# Patient Record
Sex: Male | Born: 1960
Health system: Southern US, Community
[De-identification: ages and names within clinical notes are randomized; demographics above are authoritative.]

## PROBLEM LIST (undated history)

## (undated) DIAGNOSIS — C7951 Secondary malignant neoplasm of bone: Secondary | ICD-10-CM

## (undated) DIAGNOSIS — F329 Major depressive disorder, single episode, unspecified: Secondary | ICD-10-CM

## (undated) DIAGNOSIS — B029 Zoster without complications: Secondary | ICD-10-CM

## (undated) DIAGNOSIS — C9001 Multiple myeloma in remission: Secondary | ICD-10-CM

## (undated) DIAGNOSIS — Z9119 Patient's noncompliance with other medical treatment and regimen: Secondary | ICD-10-CM

## (undated) DIAGNOSIS — Z79899 Other long term (current) drug therapy: Secondary | ICD-10-CM

## (undated) DIAGNOSIS — R040 Epistaxis: Secondary | ICD-10-CM

## (undated) DIAGNOSIS — Z923 Personal history of irradiation: Secondary | ICD-10-CM

## (undated) DIAGNOSIS — H538 Other visual disturbances: Secondary | ICD-10-CM

## (undated) DIAGNOSIS — E785 Hyperlipidemia, unspecified: Secondary | ICD-10-CM

## (undated) DIAGNOSIS — R110 Nausea: Secondary | ICD-10-CM

## (undated) DIAGNOSIS — K219 Gastro-esophageal reflux disease without esophagitis: Secondary | ICD-10-CM

## (undated) DIAGNOSIS — R5383 Other fatigue: Secondary | ICD-10-CM

## (undated) DIAGNOSIS — D696 Thrombocytopenia, unspecified: Secondary | ICD-10-CM

## (undated) DIAGNOSIS — K869 Disease of pancreas, unspecified: Secondary | ICD-10-CM

## (undated) DIAGNOSIS — I10 Essential (primary) hypertension: Secondary | ICD-10-CM

## (undated) DIAGNOSIS — C9002 Multiple myeloma in relapse: Secondary | ICD-10-CM

## (undated) DIAGNOSIS — Z7969 Long term (current) use of other immunomodulators and immunosuppressants: Secondary | ICD-10-CM

## (undated) DIAGNOSIS — R51 Headache: Secondary | ICD-10-CM

## (undated) DIAGNOSIS — M8700 Idiopathic aseptic necrosis of unspecified bone: Secondary | ICD-10-CM

## (undated) DIAGNOSIS — C9030 Solitary plasmacytoma not having achieved remission: Secondary | ICD-10-CM

## (undated) DIAGNOSIS — F419 Anxiety disorder, unspecified: Secondary | ICD-10-CM

## (undated) DIAGNOSIS — R5081 Fever presenting with conditions classified elsewhere: Secondary | ICD-10-CM

## (undated) DIAGNOSIS — K769 Liver disease, unspecified: Secondary | ICD-10-CM

## (undated) DIAGNOSIS — C9022 Extramedullary plasmacytoma in relapse: Secondary | ICD-10-CM

## (undated) DIAGNOSIS — M25569 Pain in unspecified knee: Secondary | ICD-10-CM

## (undated) DIAGNOSIS — D709 Neutropenia, unspecified: Secondary | ICD-10-CM

## (undated) HISTORY — DX: Multiple myeloma in remission: C90.01

## (undated) HISTORY — DX: Patient's noncompliance with other medical treatment and regimen: Z91.19

## (undated) HISTORY — DX: Other fatigue: R53.83

## (undated) HISTORY — DX: Headache: R51

## (undated) HISTORY — DX: Neutropenia, unspecified: D70.9

## (undated) HISTORY — DX: Idiopathic aseptic necrosis of unspecified bone: M87.00

## (undated) HISTORY — DX: Solitary plasmacytoma not having achieved remission: C90.30

## (undated) HISTORY — DX: Major depressive disorder, single episode, unspecified: F32.9

## (undated) HISTORY — DX: Other visual disturbances: H53.8

## (undated) HISTORY — DX: Hyperlipidemia, unspecified: E78.5

## (undated) HISTORY — DX: Anxiety disorder, unspecified: F41.9

## (undated) HISTORY — DX: Nausea: R11.0

## (undated) HISTORY — DX: Fever presenting with conditions classified elsewhere: R50.81

## (undated) HISTORY — DX: Epistaxis: R04.0

## (undated) HISTORY — DX: Zoster without complications: B02.9

## (undated) HISTORY — DX: Multiple myeloma in relapse: C90.02

## (undated) HISTORY — DX: Extramedullary plasmacytoma in relapse: C90.22

## (undated) HISTORY — DX: Pain in unspecified knee: M25.569

## (undated) HISTORY — PX: OTHER SURGICAL HISTORY: SHX169

---

## 2003-07-19 ENCOUNTER — Encounter: Payer: Self-pay | Admitting: Internal Medicine

## 2003-07-19 ENCOUNTER — Ambulatory Visit (HOSPITAL_COMMUNITY): Admission: RE | Admit: 2003-07-19 | Discharge: 2003-07-19 | Payer: Self-pay | Admitting: Internal Medicine

## 2003-08-23 ENCOUNTER — Ambulatory Visit (HOSPITAL_COMMUNITY): Admission: RE | Admit: 2003-08-23 | Discharge: 2003-08-23 | Payer: Self-pay | Admitting: Internal Medicine

## 2003-08-27 ENCOUNTER — Emergency Department (HOSPITAL_COMMUNITY): Admission: EM | Admit: 2003-08-27 | Discharge: 2003-08-27 | Payer: Self-pay | Admitting: Emergency Medicine

## 2003-08-27 ENCOUNTER — Ambulatory Visit (HOSPITAL_COMMUNITY): Admission: RE | Admit: 2003-08-27 | Discharge: 2003-08-27 | Payer: Self-pay | Admitting: Internal Medicine

## 2006-10-13 ENCOUNTER — Ambulatory Visit (HOSPITAL_COMMUNITY): Admission: RE | Admit: 2006-10-13 | Discharge: 2006-10-13 | Payer: Self-pay | Admitting: Family Medicine

## 2009-09-04 ENCOUNTER — Emergency Department (HOSPITAL_COMMUNITY): Admission: EM | Admit: 2009-09-04 | Discharge: 2009-09-04 | Payer: Self-pay | Admitting: Emergency Medicine

## 2011-01-14 LAB — URINALYSIS, ROUTINE W REFLEX MICROSCOPIC
Glucose, UA: NEGATIVE mg/dL
Leukocytes, UA: NEGATIVE
Protein, ur: >300 mg/dL — AB
Specific Gravity, Urine: >1.03 — ABNORMAL HIGH (ref 1.005–1.030)
pH: 6.5 (ref 5.0–8.0)

## 2011-01-14 LAB — URINE MICROSCOPIC-ADD ON

## 2011-02-27 NOTE — Op Note (Signed)
NAME:  Austin Griffith, Austin Griffith                        ACCOUNT NO.:  [id number]   MEDICAL RECORD NO.:  [id number]                   PATIENT TYPE:  AMB   LOCATION:  DAY                                  FACILITY:  [facility]   PHYSICIAN:  R. Roetta Sessions, M.D.              DATE OF BIRTH:  04/05/1961   DATE OF PROCEDURE:  08/23/2003  DATE OF DISCHARGE:                                 OPERATIVE REPORT   PROCEDURE:  Esophagogastroduodenoscopy with biopsy.   INDICATIONS FOR PROCEDURE:  The patient is a 50 year old with recent upper  abdominal discomfort who had an episode of nausea and vomiting over the past  weekend and brought up a small amount of blood. This was one episode. He has  chronic reflux symptoms and has been on Pepcid and was started on Prevacid  30 mg orally the first of the week. He has not had any odynophagia, no  dysphagia. Labs were reportedly  normal through Dr. Lamar Blinks office but I  do not have copies.   He continues to have upper abdominal pain. He had one black stool but he  tells me he took some Pepto-Bismol just before this observation. EGD is now  being done. This approach has been discussed the patient previously. The  potential risks, benefits, and alternatives have been reviewed, questions  answered. He is agreeable. Please see my handwritten H&P for more  information.   MONITORING:  O2 saturation, blood pressure, pulse, and respirations were  monitored throughout the entire procedure.   CONSCIOUS SEDATION:  Versed 3 mg IV, Demerol 75 mg IV in divided doses.  Cetacaine spray for topical oropharyngeal anesthesia.   INSTRUMENTS:  Olympus video chip adult gastroscope.   FINDINGS:  Examination of the tubular esophagus revealed patulous EG  junction. There were two 3 cm tongues of salmon colored epithelium coming  up above what appeared to be the squamocolumnar junction. There were no  erosions, ulcers, no strictures or evidence of neoplasia.  The EG junction  was  easily traversed.   STOMACH:  The gastric cavity was empty and insufflated well with air. A  thorough examination of the gastric mucosa including a retroflexed view of  the proximal stomach and esophagogastric junction demonstrated only a small  to moderate size hiatal hernia. The gastric mucosa otherwise appeared  normal. The pylorus was patent and easily traversed.   DUODENUM:  The bulb and second portion appeared normal.   THERAPEUTIC/DIAGNOSTIC MANEUVERS:  The two tongues of salmon colored  epithelium in the distal esophagus were biopsied for histologic studies. The  patient tolerated the procedure well and was reacted in endoscopy.   IMPRESSION:  Patulous esophagogastric junction, two tongues of salmon  colored epithelium coming up into the distal esophagus as described above  concerning for Barrett's esophagus biopsied otherwise the esophageal mucosa  appeared normal.   STOMACH:  A moderate to small size hiatal hernia otherwise gastric mucosa  appeared  normal. Patent pylorus, normal D1 and D2.   RECOMMENDATIONS:  1. Continue Prevacid 30 mg orally daily.  2. Check CBC and see where we are at today. Will also obtain amylase, lipase     and obtain a right upper quadrant ultrasound to rule out occult     gallbladder disease. Further recommendations to follow.      ___________________________________________                                            Jonathon Bellows, M.D.   RMR/MEDQ  D:  08/23/2003  T:  08/23/2003  Job:  [id number]   cc:   Corrie Mckusick, M.D.  [address] [place]. [place]  [place]   [address]  Fax: [phone]

## 2012-01-19 ENCOUNTER — Encounter (HOSPITAL_COMMUNITY): Payer: Self-pay

## 2012-01-19 ENCOUNTER — Emergency Department (HOSPITAL_COMMUNITY)
Admission: EM | Admit: 2012-01-19 | Discharge: 2012-01-19 | Disposition: A | Discharge status: Home / Self Care | Payer: No Typology Code available for payment source | Attending: Emergency Medicine | Admitting: Emergency Medicine

## 2012-01-19 ENCOUNTER — Emergency Department (HOSPITAL_COMMUNITY): Payer: No Typology Code available for payment source

## 2012-01-19 DIAGNOSIS — I10 Essential (primary) hypertension: Secondary | ICD-10-CM | POA: Insufficient documentation

## 2012-01-19 DIAGNOSIS — R51 Headache: Secondary | ICD-10-CM | POA: Insufficient documentation

## 2012-01-19 DIAGNOSIS — J329 Chronic sinusitis, unspecified: Secondary | ICD-10-CM | POA: Insufficient documentation

## 2012-01-19 HISTORY — DX: Essential (primary) hypertension: I10

## 2012-01-19 MED ORDER — ACETAMINOPHEN 500 MG PO TABS
1000.0000 mg | ORAL_TABLET | Freq: Once | ORAL | Status: AC
  Administered 2012-01-19: 1000 mg via ORAL
  Filled 2012-01-19: qty 2

## 2012-01-19 MED ORDER — IBUPROFEN 800 MG PO TABS
800.0000 mg | ORAL_TABLET | Freq: Once | ORAL | Status: AC
  Administered 2012-01-19: 800 mg via ORAL
  Filled 2012-01-19: qty 1

## 2012-01-19 MED ORDER — LEVOFLOXACIN 500 MG PO TABS: 500.0000 mg | ORAL_TABLET | Freq: Every day | ORAL | Status: AC

## 2012-01-19 MED ORDER — ONDANSETRON HCL 8 MG PO TABS: 8.0000 mg | ORAL_TABLET | ORAL | Status: AC | PRN

## 2012-01-19 MED ORDER — OXYCODONE-ACETAMINOPHEN 5-325 MG PO TABS: 2.0000 | ORAL_TABLET | ORAL | Status: AC | PRN

## 2012-01-19 NOTE — ED Provider Notes (Signed)
History  This chart was scribed for Austin Hutching, MD by Bennett Scrape. This patient was seen in room [facility] and the patient's care was started at 5:49PM.  CSN: [id number]  Arrival date & time 01/19/12  1618   First MD Initiated Contact with Patient 01/19/12 1723      Chief Complaint  Patient presents with  . Headache  . Epistaxis    The history is provided by the patient. No language interpreter was used.    [name] is a 51 y.o. male who presents to the Emergency Department complaining of 3 days of gradual onset, gradually worsening, constant HA with associated dizziness with standing and visual disturbance described as blurred vision. The HA is bifrontally and bitemporally located. It is non-radiating. The HA is worse with standing, movement and driving. He has not tried any medications at home to improve symptoms. Pt denies having a h/o HAs or having any prior episodes of similar symptoms. Pt also c/o 2 episodes of epistaxis that occurred last week. Pt states that he has seen his PCP for the symptoms and was advised to come here for a CT scan of his head. He denies fever, sore throat, congestion, chest pain, abdominal pain, nausea, diarrhea, SOB, weakness and confusion as associated symptoms. Pt reports that he has a h/o anxiety an was put on ativan. He denies having any recent attacks but does reports an increase in stress at work. 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 Pt also has a h/o HTN and reports that he has been taking his medication as prescribed. He denies smoking and alcohol use.  Pt's PCP is Dr. Genia Hotter.    Past Medical History  Diagnosis Date  . Hypertension     Past Surgical History  Procedure Date  . Joint replacement     r hip replacement  . Left hip surgery     No family history on file.  History  Substance Use Topics  . Smoking status: Never Smoker   . Smokeless  tobacco: Not on file  . Alcohol Use: No      Review of Systems  A complete 10 system review of systems was obtained and all systems are negative except as noted in the HPI and PMH.   Allergies  Review of patient's allergies indicates no known allergies.  Home Medications  No current outpatient prescriptions on file.  Triage Vitals: BP 153/106  Pulse 79  Temp(Src) 97.9 F (36.6 C) (Oral)  Resp 18  Ht 5\' 7"  (1.702 m)  Wt 275 lb (124.739 kg)  BMI 43.07 kg/m2  SpO2 100%  Physical Exam  Nursing note and vitals reviewed. Constitutional: He is oriented to person, place, and time. He appears well-developed and well-nourished.  HENT:  Head: Normocephalic and atraumatic.       No sinus tenderness  Eyes: Conjunctivae and EOM are normal.  Neck: Normal range of motion. Neck supple.  Cardiovascular: Normal rate, regular rhythm and normal heart sounds.   Pulmonary/Chest: Effort normal and breath sounds normal.  Abdominal: Soft. There is no tenderness.  Musculoskeletal: Normal range of motion. He exhibits no edema.  Neurological: He is alert and oriented to person, place, and time.  Skin: Skin is warm and dry.  Psychiatric: He has a normal mood and affect. His behavior is normal.    ED Course  Procedures (including critical care time)  DIAGNOSTIC STUDIES: Oxygen Saturation is 100%  on room air, normal by my interpretation.    COORDINATION OF CARE: 5:54PM-Discussed tylenol, motrin and CT scan of head with pt and pt agreed to plan.   Labs Reviewed - No data to display  Ct Head Wo Contrast  01/19/2012  *RADIOLOGY REPORT*  Clinical Data: Headaches.  High blood pressure.  Dizziness.  CT HEAD WITHOUT CONTRAST  Technique:  Contiguous axial images were obtained from the base of the skull through the vertex without contrast.  Comparison: None available.  Findings:  Opacification mid to posterior right ethmoid sinus air cells with questionable extension through the right lamina papyracea.   There is also poor definition of the posterior wall of the right ethmoid sinus air cells which may represent destruction or deossification. This raises possibility of a mass rather than simple sinus opacification. Atypical infection secondary less likely consideration.  The right sphenoid sinus is completely opacified with mild dehiscence of the surrounding walls laterally anteriorly and medially.  Polypoid opacification anterior aspect of the left sphenoid sinus with air fluid level in the dependent aspect.  ENT consultation recommended for further delineation.  No intracranial hemorrhage.  No CT evidence of large acute infarct.  Mild atrophy without hydrocephalus.  Partially empty sella.  IMPRESSION: Opacification right ethmoid sinus air cells and sphenoid sinus air cells raising possibility of a mass.  ENT consultation recommended for further delineation.  Please see above.  Original Report Authenticated By: Fuller Canada, M.D.     No diagnosis found.    MDM  CT scan discussed with Dr. Annalee Genta in [place]. He recommended treatment for sinusitis with a quinolone.  Will followup in office. Patient understands the possibility of a mass. He will call tomorrow for a appointment with otolaryngologist   I personally performed the services described in this documentation, which was scribed in my presence. The recorded information has been reviewed and considered.       Austin Hutching, MD 01/19/12 2026

## 2012-01-19 NOTE — ED Notes (Signed)
Pt reports has had headache since Saturday, had nose bleed last week and small nose bleed this morning.  Went to Dr. Lamar Blinks office and was told to come here for ct scan.  Pt on medication for htn and says has been taking it as directed.

## 2012-01-19 NOTE — ED Notes (Signed)
Pt denies improvement in headache at this time.  Denies additional needs.  No bleeding at present. No distress noted.

## 2012-01-19 NOTE — Discharge Instructions (Signed)
Prescriptions for antibiotic, pain medicine, nausea medicine.  I discussed your situation with a ear, nose,and throat doctor in [place].  He wants to see you in followup. Call tomorrow for an appointment later in the week.

## 2012-01-21 ENCOUNTER — Other Ambulatory Visit: Payer: Self-pay | Admitting: Otolaryngology

## 2012-01-21 DIAGNOSIS — J3489 Other specified disorders of nose and nasal sinuses: Secondary | ICD-10-CM

## 2012-01-22 ENCOUNTER — Ambulatory Visit
Admission: RE | Admit: 2012-01-22 | Discharge: 2012-01-22 | Disposition: A | Discharge status: Home / Self Care | Payer: No Typology Code available for payment source | Source: Ambulatory Visit | Attending: Otolaryngology | Admitting: Otolaryngology

## 2012-01-22 DIAGNOSIS — J3489 Other specified disorders of nose and nasal sinuses: Secondary | ICD-10-CM

## 2012-01-26 ENCOUNTER — Other Ambulatory Visit: Payer: Self-pay | Admitting: Otolaryngology

## 2012-01-26 HISTORY — PX: MASS BIOPSY: SHX5445

## 2012-02-04 ENCOUNTER — Telehealth: Payer: Self-pay | Admitting: Oncology

## 2012-02-04 NOTE — Telephone Encounter (Signed)
called pts s/w wife and scheduled appt for 04/29.  faxed over a letter to Dr.Byers with appt d/t

## 2012-02-05 ENCOUNTER — Telehealth: Payer: Self-pay | Admitting: Oncology

## 2012-02-05 ENCOUNTER — Encounter: Payer: Self-pay | Admitting: Oncology

## 2012-02-05 DIAGNOSIS — I10 Essential (primary) hypertension: Secondary | ICD-10-CM | POA: Insufficient documentation

## 2012-02-05 NOTE — Telephone Encounter (Signed)
Referred by Dr. Suzanna Obey Dx- Plasma Cell/Ethmoid

## 2012-02-08 ENCOUNTER — Ambulatory Visit (HOSPITAL_BASED_OUTPATIENT_CLINIC_OR_DEPARTMENT_OTHER): Payer: No Typology Code available for payment source | Admitting: Oncology

## 2012-02-08 ENCOUNTER — Ambulatory Visit: Payer: No Typology Code available for payment source

## 2012-02-08 ENCOUNTER — Encounter: Payer: Self-pay | Admitting: Oncology

## 2012-02-08 ENCOUNTER — Other Ambulatory Visit: Payer: No Typology Code available for payment source | Admitting: Lab

## 2012-02-08 ENCOUNTER — Telehealth: Payer: Self-pay | Admitting: Oncology

## 2012-02-08 VITALS — BP 137/98 | HR 68 | Temp 97.6°F | Ht 74.0 in | Wt 288.7 lb

## 2012-02-08 DIAGNOSIS — R519 Headache, unspecified: Secondary | ICD-10-CM | POA: Insufficient documentation

## 2012-02-08 DIAGNOSIS — I10 Essential (primary) hypertension: Secondary | ICD-10-CM

## 2012-02-08 DIAGNOSIS — D729 Disorder of white blood cells, unspecified: Secondary | ICD-10-CM

## 2012-02-08 DIAGNOSIS — R040 Epistaxis: Secondary | ICD-10-CM

## 2012-02-08 DIAGNOSIS — D7289 Other specified disorders of white blood cells: Secondary | ICD-10-CM

## 2012-02-08 DIAGNOSIS — R51 Headache: Secondary | ICD-10-CM

## 2012-02-08 HISTORY — DX: Epistaxis: R04.0

## 2012-02-08 HISTORY — DX: Headache: R51

## 2012-02-08 LAB — COMPREHENSIVE METABOLIC PANEL
ALT: 83 U/L — ABNORMAL HIGH (ref 0–53)
AST: 40 U/L — ABNORMAL HIGH (ref 0–37)
Albumin: 3.8 g/dL (ref 3.5–5.2)
Alkaline Phosphatase: 71 U/L (ref 39–117)
BUN: 15 mg/dL (ref 6–23)
Calcium: 8.7 mg/dL (ref 8.4–10.5)
Chloride: 102 mEq/L (ref 96–112)
Potassium: 4.2 mEq/L (ref 3.5–5.3)

## 2012-02-08 LAB — CBC WITH DIFFERENTIAL/PLATELET
BASO%: 0.5 % (ref 0.0–2.0)
EOS%: 1.9 % (ref 0.0–7.0)
MCH: 32.7 pg (ref 27.2–33.4)
MCHC: 34.7 g/dL (ref 32.0–36.0)
MONO#: 0.4 10*3/uL (ref 0.1–0.9)
NEUT%: 70.8 % (ref 39.0–75.0)
RBC: 4.31 10*6/uL (ref 4.20–5.82)
RDW: 12.6 % (ref 11.0–14.6)
WBC: 5.7 10*3/uL (ref 4.0–10.3)
lymph#: 1.2 10*3/uL (ref 0.9–3.3)
nRBC: 0 % (ref 0–0)

## 2012-02-08 NOTE — Patient Instructions (Signed)
1.  Diagnosis: - Possibility:  Solitary plasmacytoma; multiple plasmacytoma; vs. Multiple myeloma.   2.  Work up: - Bone marrow biopsy to rule out myeloma in bone marrow. - PET scan to ascertain location of plasmacytoma beside your sinus.    3.  Treatment:   - If solitary plasmacytoma, best treatment is radiation (you have been referred to Dr. Basilio Cairo, Radiation Oncologist on Wed 02/10/2012 at 10:30am) - If multiple plasmacytoma or myeloma; chemotherapy.  Chemotherapy for myeloma is light not as severe side effects as in other cancers .   4.  Follow up:  - Bone marrow biopsy tomorrow at 8:45am. - Follow up with me in after result of PET scan and bone marrow biopsy result in about 7-10 days.

## 2012-02-08 NOTE — Progress Notes (Signed)
[facility]  Telephone:([phone] Fax:(336) [phone]   MEDICAL ONCOLOGY - INITIAL CONSULATION    Referral MD:  Dr. Suzanna Obey, M.D.  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    HPI:  Mr. Austin Griffith is a 51 year old man from [place], [place] with PMH of HTN, borderline HLP, EtOH with about 6 pack of beer/day.  He had mild sinus pain in the past.  However, 6 weeks prior to presentation, he developed persistent right-sided headache, along with intermittent right nose epistaxis.  He had CT head which showed and expansile  ethmoid sinus mass on 01/19/12 and 01/22/12.  He was given a course of empiric antibiotic without improvement of his headache.  In the meanwhile, he was referred to Dr. Jearld Fenton who performed a biopsy.  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  He was thus kindly referred to the [facility] for evaluation.  Mr. Austin Griffith presented to the clinic today with his wife.  He still has pressure-type right sided headache behind his right eye.  Headache is described as moderate.  He only takes Lortab about once or at most twice daily.  His epistaxis has persisted; however, not as severe as 3 weeks ago.  He has had blurry vision for many years.  He denies worsening of his vision over the past few weeks.  He denies diplopia, problem looking up or walking down stairs.  He has had worsening fatigue for the past few months.  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   Patient denies visual changes, confusion, drenching night sweats, palpable lymph node swelling, palpable mass anywhere else, mucositis, odynophagia, dysphagia, nausea vomiting, jaundice, chest pain, palpitation, shortness of breath, dyspnea on exertion, productive cough, gum bleeding, hematemesis, hemoptysis, abdominal pain, abdominal swelling, early satiety, melena, hematochezia, hematuria, skin  rash, spontaneous bleeding, joint swelling, joint pain, heat or cold intolerance, bowel bladder incontinence, back pain, focal motor weakness, paresthesia, depression, suicidal or homocidal ideation, feeling hopelessness.    Past Medical History  Diagnosis Date  . Hypertension   . Plasma cell disorder   . Hyperlipidemia     diet control   . AVN (avascular necrosis of bone)     bilateral hips; s/p hip replacement in 1991 and 2009  . Headache 02/08/2012  . Epistaxis 02/08/2012  :  Past Surgical History  Procedure Date  . Bilateral hip replacement 1991; and 2009    due to bilateral hip AVN's.   :  Current Outpatient Prescriptions  Medication Sig Dispense Refill  . escitalopram (LEXAPRO) 20 MG tablet Take 10 mg by mouth daily.      Marland Kitchen HYDROcodone-acetaminophen (NORCO) 5-325 MG per tablet Take 1 tablet by mouth every 6 (six) hours as needed.      Marland Kitchen ibuprofen (ADVIL,MOTRIN) 200 MG tablet Take 600 mg by mouth as needed. FOR HEADACHE PAIN      . lansoprazole (PREVACID) 15 MG capsule Take 15 mg by mouth daily.      Marland Kitchen LORazepam (ATIVAN) 1 MG tablet Take 1 mg by mouth every 8 (eight) hours as needed. For anxiety      . olmesartan (BENICAR) 20 MG tablet Take 10 mg by mouth daily.         No Known Allergies:  Family History  Problem Relation Age of Onset  . Adopted: Yes  :  History   Social History  . Marital Status: Married  Spouse Name: N/A    Number of Children: 2  . Years of Education: N/A   Occupational History  .      owns a dump truck company   Social History Main Topics  . Smoking status: Never Smoker   . Smokeless tobacco: Never Used  . Alcohol Use: 24.0 oz/week    40 Cans of beer per week  . Drug Use: No  . Sexually Active: Yes    Birth Control/ Protection: None   Other Topics Concern  . Not on file   Social History Narrative  . No narrative on file  :  Pertinent items are noted in HPI.  Exam: ECOG 1  General:  Mildly obese man,  in no acute  distress.  Eyes:  no scleral icterus.  ENT:  There were no oropharyngeal lesions.  I did not see any mas in the external nasal passage.  There was mild tenderness to percussion of the right forehead.  Neck was without thyromegaly.  Lymphatics:  Negative cervical, supraclavicular or axillary adenopathy.  Respiratory: lungs were clear bilaterally without wheezing or crackles.  Cardiovascular:  Regular rate and rhythm, S1/S2, without murmur, rub or gallop.  There was no pedal edema.  GI:  abdomen was soft, flat, nontender, nondistended, without organomegaly.  Muscoloskeletal:  no spinal tenderness of palpation of vertebral spine.  Skin exam was without echymosis, petichae. I could not palpate any abnormal soft tissue mass.  Neuro exam was nonfocal.  Extraoccular motor function was intact.   Patient was able to get on and off exam table without assistance.  Gait was normal.  Patient was alerted and oriented.  Attention was good.   Language was appropriate.  Mood was normal without depression.  Speech was not pressured.  Thought content was not tangential.     Lab Results  Component Value Date   WBC 5.7 02/08/2012   HGB 14.1 02/08/2012   HCT 40.6 02/08/2012   PLT 139* 02/08/2012   GLUCOSE 117* 02/08/2012   ALT 83* 02/08/2012   AST 40* 02/08/2012   NA 136 02/08/2012   K 4.2 02/08/2012   CL 102 02/08/2012   CREATININE 0.99 02/08/2012   BUN 15 02/08/2012   CO2 27 02/08/2012    IMAGING:  I personally reviewed the following CT and showed the patient and his wife the pictures.  There was a mass in the ethmoid sinus about 5x2cm. It is difficult to tell whether there is involvement of the right medial rectus.   Ct Head Wo Contrast  01/19/2012  *RADIOLOGY REPORT*  Clinical Data: Headaches.  High blood pressure.  Dizziness.  CT HEAD WITHOUT CONTRAST  Technique:  Contiguous axial images were obtained from the base of the skull through the vertex without contrast.  Comparison: None available.  Findings:  Opacification mid  to posterior right ethmoid sinus air cells with questionable extension through the right lamina papyracea.  There is also poor definition of the posterior wall of the right ethmoid sinus air cells which may represent destruction or deossification. This raises possibility of a mass rather than simple sinus opacification. Atypical infection secondary less likely consideration.  The right sphenoid sinus is completely opacified with mild dehiscence of the surrounding walls laterally anteriorly and medially.  Polypoid opacification anterior aspect of the left sphenoid sinus with air fluid level in the dependent aspect.  ENT consultation recommended for further delineation.  No intracranial hemorrhage.  No CT evidence of large acute infarct.  Mild atrophy without hydrocephalus.  Partially empty sella.  IMPRESSION: Opacification right ethmoid sinus air cells and sphenoid sinus air cells raising possibility of a mass.  ENT consultation recommended for further delineation.  Please see above.  Original Report Authenticated By: Fuller Canada, M.D.   Ct Maxillofacial Wo Cm  01/22/2012  *RADIOLOGY REPORT*  Clinical Data: Preop for a mucocele.  Headaches and facial pain.  CT MAXILLOFACIAL WITHOUT CONTRAST  Technique:  Multidetector CT imaging of the maxillofacial structures was performed. Multiplanar CT image reconstructions were also generated.  Comparison: CT head without contrast 01/19/2012.  Findings: Surgical planning protocol was utilized.  There is an expansile soft tissue mass within the right ethmoid air cells compatible with a mucocele. A more aggressive neoplasm is not excluded, but this appears to be a benign expansion of the sinus. The right sphenoid sinus is opacified as well.  It is unclear if this represents a mucocele or simply an obstructed sphenoid sinus from the ethmoid mucocele.  Mild mucosal thickening is noted in the left sphenoid sinus.  A fluid level is also present in the left sphenoid sinus.  The  right frontal sinus is clear.  The maxillary sinus is clear bilaterally.  The mastoid air cells are clear as well.  IMPRESSION:  1.  Ethmoid mucocele with some expansion of the air cells and soft tissue extending into the upper nasal cavity. 2.  Opacification of the sphenoid sinus is likely secondary to obstruction from the ethmoid mucocele. 3.  Mild left sphenoid sinus disease. 4.  No significant anterior sinus disease.  Original Report Authenticated By: Jamesetta Orleans. MATTERN, M.D.    Assessment and Plan:   1.  EtOH:  He drinks about 6 packs of beer every day.  I strongly urged him to stop drinking since he has elevated LFT, and thrombocytopenia.  2.  Elevated LFT: - Differential:  EtoH.  A PET scan work up for plasma cell disease will also evaluate if there is liver abnormality.  Much less likely liver involvement.  Will also rule out amyloidosis with bone marrow biopsy; but clinically there is low suspicion for amyloidosis.  - If not improved with stopping EtOH and no liver involvement of his plasma cell disorder, I appreciate Dr. Lamar Blinks work up as appropriate.   3.  Thrombocytopenia: - most likely due to [facility].  Low clinical suspicion for myeloma since myeloma normally causes anemia before thrombocytopenia. - Work up:  Rule out myeloma (as detailed in the next section).   - Treatment:  I advised him to stop drinking.  4.  Headache, epistaxis; and right ethmoid sinus plasmacytoma: - Differntials:  Solitary plasmacytoma; multiple plasmacytoma; vs. Multiple myeloma.  -  Work up: Bone marrow biopsy to rule out myeloma in bone marrow. I requested a 24 hour urine collection for UPEP and total protein.  I requested a PET scan to see if he has other plamacytomas beside then one in the right ethmoid sinus.  - Treatment:   If solitary plasmacytoma, best treatment is radiation.  I referred patient to Dr. Basilio Cairo who has graciously agreed to see patient on Wed 02/10/2012.  If multiple plasmacytoma or  myeloma, he would benefit from chemotherapy.  He does not have diplopia, but I cannot tell for sure if there is right medial rectus involvement on the CT scan.  I defer to Dr. Colletta Maryland judgement to see if radiation is indicated regardless of his extend of plasma cell disease (solitary vs multilpe vs myeloma).  His epistaxis has slowed down, and he  does not have diplopia; thus, I did not think that he needs admission today for work up.  I sent an email to Dr. Basilio Cairo asking her to see if he needs radiation and if so, timing/urgency of radiation.   5.  Follow up:  - Bone marrow biopsy tomorrow at 8:45am. - Follow up with me in after result of PET scan and bone marrow biopsy result in about 7-10 days.

## 2012-02-08 NOTE — Telephone Encounter (Signed)
Gave pt appt date for PET Scan and Bone scan, on 02/11/12 and see MD on 02/12/12

## 2012-02-09 ENCOUNTER — Ambulatory Visit: Payer: No Typology Code available for payment source

## 2012-02-09 ENCOUNTER — Encounter: Payer: Self-pay | Admitting: Radiation Oncology

## 2012-02-09 ENCOUNTER — Other Ambulatory Visit: Payer: Self-pay | Admitting: Oncology

## 2012-02-09 ENCOUNTER — Other Ambulatory Visit (HOSPITAL_COMMUNITY)
Admission: RE | Admit: 2012-02-09 | Discharge: 2012-02-09 | Disposition: A | Discharge status: Home / Self Care | Payer: No Typology Code available for payment source | Source: Ambulatory Visit | Attending: Oncology | Admitting: Oncology

## 2012-02-09 VITALS — BP 123/77 | HR 73 | Temp 97.8°F

## 2012-02-09 DIAGNOSIS — C9030 Solitary plasmacytoma not having achieved remission: Secondary | ICD-10-CM | POA: Insufficient documentation

## 2012-02-09 DIAGNOSIS — D729 Disorder of white blood cells, unspecified: Secondary | ICD-10-CM

## 2012-02-09 NOTE — Patient Instructions (Signed)
Pt instructed to monitor site for excess bleeding, apply pressure as needed, and to call for bleeding and pain that increases or worsens.  Pt to leave dressing on for 24 hrs.  Pt to call for questions and concerns.  Pt and wife discharged to home.

## 2012-02-09 NOTE — Progress Notes (Unsigned)
   [facility]  Telephone:(336) [phone] Fax:(336) [phone]   BONE MARROW BIOPSY AND ASPIRATION   INDICATION:  Plasmacytoma; rule out multiple myeloma.   Procedure: After obtained from consent, IVORY BAIL was placed in the prone position. Time out was performed verifying correct patient and procedure. The skin overlying the left posterior crest was prepped with Betadine and draped in the usual sterile fashion. The skin and periosteum were infiltrated with 10 mL of 2% lidocaine x 3 as he was still uncomfortable after the first 20mL. A small puncture wound was made with #11 scalpel blade.  Bone marrow aspirate was obtained on the first pass of the aspiration needle.  One core biopsy was obtained through the same incision.   The aspirate was sent for routine histology, flow cytometry, and cytogenetics and possibly FISH myeloma panel if myeloma is confirmed.  Core biopsy was sent for routine histology.   [name] tolerated procedure well with minimal  blood loss and without immediate complication.   A sterile dressing was applied.   Jethro Bolus M.D. 02/09/2012

## 2012-02-10 ENCOUNTER — Ambulatory Visit
Admission: RE | Admit: 2012-02-10 | Discharge: 2012-02-10 | Disposition: A | Discharge status: Home / Self Care | Payer: No Typology Code available for payment source | Source: Ambulatory Visit | Attending: Radiation Oncology | Admitting: Radiation Oncology

## 2012-02-10 ENCOUNTER — Encounter: Payer: Self-pay | Admitting: Radiation Oncology

## 2012-02-10 VITALS — BP 120/82 | HR 95 | Temp 98.7°F | Wt 284.8 lb

## 2012-02-10 VITALS — BP 120/82 | HR 95 | Temp 98.7°F | Wt 284.0 lb

## 2012-02-10 DIAGNOSIS — R51 Headache: Secondary | ICD-10-CM | POA: Insufficient documentation

## 2012-02-10 DIAGNOSIS — Z79899 Other long term (current) drug therapy: Secondary | ICD-10-CM | POA: Insufficient documentation

## 2012-02-10 DIAGNOSIS — M545 Low back pain, unspecified: Secondary | ICD-10-CM | POA: Insufficient documentation

## 2012-02-10 DIAGNOSIS — H538 Other visual disturbances: Secondary | ICD-10-CM | POA: Insufficient documentation

## 2012-02-10 DIAGNOSIS — C9030 Solitary plasmacytoma not having achieved remission: Secondary | ICD-10-CM | POA: Insufficient documentation

## 2012-02-10 DIAGNOSIS — C311 Malignant neoplasm of ethmoidal sinus: Secondary | ICD-10-CM | POA: Insufficient documentation

## 2012-02-10 DIAGNOSIS — F411 Generalized anxiety disorder: Secondary | ICD-10-CM | POA: Insufficient documentation

## 2012-02-10 DIAGNOSIS — E785 Hyperlipidemia, unspecified: Secondary | ICD-10-CM | POA: Insufficient documentation

## 2012-02-10 DIAGNOSIS — Z96649 Presence of unspecified artificial hip joint: Secondary | ICD-10-CM | POA: Insufficient documentation

## 2012-02-10 DIAGNOSIS — I10 Essential (primary) hypertension: Secondary | ICD-10-CM | POA: Insufficient documentation

## 2012-02-10 DIAGNOSIS — R110 Nausea: Secondary | ICD-10-CM | POA: Insufficient documentation

## 2012-02-10 DIAGNOSIS — Z510 Encounter for antineoplastic radiation therapy: Secondary | ICD-10-CM | POA: Insufficient documentation

## 2012-02-10 DIAGNOSIS — F1010 Alcohol abuse, uncomplicated: Secondary | ICD-10-CM | POA: Insufficient documentation

## 2012-02-10 DIAGNOSIS — D729 Disorder of white blood cells, unspecified: Secondary | ICD-10-CM

## 2012-02-10 DIAGNOSIS — K219 Gastro-esophageal reflux disease without esophagitis: Secondary | ICD-10-CM | POA: Insufficient documentation

## 2012-02-10 HISTORY — DX: Gastro-esophageal reflux disease without esophagitis: K21.9

## 2012-02-10 LAB — KAPPA/LAMBDA LIGHT CHAINS
Kappa free light chain: 3.16 mg/dL — ABNORMAL HIGH (ref 0.33–1.94)
Kappa:Lambda Ratio: 2.43 — ABNORMAL HIGH (ref 0.26–1.65)
Lambda Free Lght Chn: 1.3 mg/dL (ref 0.57–2.63)

## 2012-02-10 NOTE — Progress Notes (Signed)
Encounter addended by: Delynn Flavin, RN on: 02/10/2012  6:18 PM<BR>     Documentation filed: Visit Diagnoses, Inpatient Document Flowsheet

## 2012-02-10 NOTE — Progress Notes (Addendum)
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 male kindly referred by Dr. Gaylyn Rong of medical oncology. The patient was in his usual state of health when he developed copious epistaxis which prompted him to go to to the emergency room [facility] and underwent a CT scan of his head without contrast. This scan, on 01/19/2012 demonstrated opacification of the right ethmoid sinus and sphenoid sinus raising possibility of the mass. ENT, consultation was recommended. The patient saw Dr. Jearld Fenton of otolaryngology thereafter. He also under went a CT scan of the maxillofacial region without contrast on 01/22/2012 which demonstrated an right ethmoid "mucocele" with some expansion of the air cells and soft tissue extending into the upper nasal cavity. There is opacification of the right sphenoid sinus with mild mucosal thickening and a fluid level present in the left sphenoid sinus. The right frontal sinus is clear. The maxillary sinuses clear bilaterally. The mastoid air cells are also clear. There appears to be possible contact between the somewhat swollen right ethmoid sinus and the right medial rectus muscle.  Biopsy of this mass on 01/26/2012 was consistent with plasma cell neoplasm. Of note the patient's biopsy and imaging were reviewed this morning at tumor board; I was present for this meeting. The patient is status post bone marrow biopsy this week and the results are pending. Dr. Gaylyn Rong of medical oncology has also scheduled the patient for staging PET scan and bone survey images.  Symptomatically, the patient reports that over the past 2 days his right eye  has been twitching. He has chronically blurry  vision but this is non-changed. He reports that his epistaxes has resolved. He has had headaches with right orbital pressure. He denies diplopia. He denies bony pain elsewhere. He denies impairment in his extraocular movements.  PREVIOUS RADIATION THERAPY: No  PAST MEDICAL HISTORY:  has a past medical history of Hypertension; Plasmacytoma; Hyperlipidemia; AVN (avascular necrosis of bone); Headache (02/08/2012); Epistaxis (02/08/2012); Blurred vision; Fatigue; and Acid reflux.  chronic anxiety. Claustrophobia. Alcohol abuse.  PAST SURGICAL HISTORY: Past Surgical History  Procedure Date  . Bilateral hip replacement 1991; and 2009    due to bilateral hip AVN's.   . Mass biopsy 01/26/12    Right Ethmoid Mass - Plasma Cell Neoplasm    FAMILY HISTORY: family history is not on file.  He is adopted.  SOCIAL HISTORY:  reports that he has never smoked. He has never used smokeless tobacco. He reports that he drinks about 25.2 ounces of alcohol per week. He reports that he does not use illicit drugs. 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 He has 2 sons: one is 14 years old and the other son is 54 years old. He is here with his wife today  ALLERGIES: Review of patient's allergies indicates no known allergies.  MEDICATIONS:  Current Outpatient Prescriptions  Medication Sig Dispense Refill  . escitalopram (LEXAPRO) 20 MG tablet Take 10 mg by mouth daily.      Marland Kitchen HYDROcodone-acetaminophen (NORCO) 5-325 MG per tablet Take 1 tablet by mouth every 6 (six) hours as needed.      . lansoprazole (PREVACID) 15 MG capsule Take 15 mg by  mouth daily.      Marland Kitchen LORazepam (ATIVAN) 1 MG tablet Take 1 mg by mouth every 8 (eight) hours as needed. For anxiety      . olmesartan (BENICAR) 20 MG tablet Take 10 mg by mouth daily.        REVIEW OF SYSTEMS:  CONSTITUTIONAL: Pain Throbbing in right Eye with twitching  EYES:Other Eye Problems See note above  EARS/NOSE/THROAT: Nose Bleeding has  resolved  HEART:None  LUNG: None  STOMACH/BOWEL: None  GENITOURINARY: None  BREASTS: None  SKIN: None  MUSCLE/BONES: None  NERVOUS SYSTEM:Dizziness if he gets up quickly after lying down  MENTAL HEALTH:Anxiety on meds  HORMONES/REPRODUCTIVE: None  BLOOD/LYMPH SYSTEM: None  IMMUNE: None       PHYSICAL EXAM:  Blood pressure 120/82, pulse 95 temperature 98.7 weight 284 pounds General: Alert and oriented, in no acute distress HEENT: Head is normocephalic. Pupils are equally round and reactive to light. Extraocular movements are intact. Vision is intact in all 4 quadrants. He has no diplopia. He has no proptosis. Oropharynx is clear. Neck: Neck is supple, no palpable cervical or supraclavicular lymphadenopathy. Heart: Regular in rate and rhythm with no murmurs, rubs, or gallops. Chest: Clear to auscultation bilaterally, with no rhonchi, wheezes, or rales. Abdomen: Soft, nontender, nondistended, with no rigidity or guarding. Extremities: No cyanosis or edema. Lymphatics: No concerning lymphadenopathy. Skin: No concerning lesions. Musculoskeletal: symmetric strength and muscle tone throughout. Neurologic: Cranial nerves II through XII are grossly intact. No obvious focalities. Speech is fluent. Coordination is intact. Psychiatric: Judgment and insight are intact. Affect is appropriate.    LABORATORY DATA:  Lab Results  Component Value Date   WBC 5.7 02/08/2012   HGB 14.1 02/08/2012   HCT 40.6 02/08/2012   MCV 94.2 02/08/2012   PLT 139* 02/08/2012    CMP     Component Value Date/Time   NA 136 02/08/2012 1447   K 4.2 02/08/2012 1447   CL 102 02/08/2012 1447   CO2 27 02/08/2012 1447   GLUCOSE 117* 02/08/2012 1447   BUN 15 02/08/2012 1447   CREATININE 0.99 02/08/2012 1447   CALCIUM 8.7 02/08/2012 1447   PROT 6.8 02/08/2012 1447   ALBUMIN 3.8 02/08/2012 1447   AST 40* 02/08/2012 1447   ALT 83* 02/08/2012 1447   ALKPHOS 71 02/08/2012 1447   BILITOT 0.5 02/08/2012 1447         RADIOGRAPHY: Ct Head Wo Contrast  01/19/2012  *RADIOLOGY REPORT*  Clinical Data: Headaches.  High blood pressure.  Dizziness.  CT HEAD WITHOUT CONTRAST  Technique:  Contiguous axial images were obtained from the base of the skull through the vertex without contrast.  Comparison: None available.  Findings:  Opacification mid to posterior right ethmoid sinus air cells with questionable extension through the right lamina papyracea.  There is also poor definition of the posterior wall of the right ethmoid sinus air cells which may represent destruction or deossification. This raises possibility of a mass rather than simple sinus opacification. Atypical infection secondary less likely consideration.  The right sphenoid sinus is completely opacified with mild dehiscence of the surrounding walls laterally anteriorly and medially.  Polypoid opacification anterior aspect of the left sphenoid sinus with air fluid level in the dependent aspect.  ENT consultation recommended for further delineation.  No intracranial hemorrhage.  No CT evidence of large acute infarct.  Mild atrophy without hydrocephalus.  Partially empty sella.  IMPRESSION: Opacification right ethmoid sinus air cells and sphenoid sinus air cells raising possibility  of a mass.  ENT consultation recommended for further delineation.  Please see above.  Original Report Authenticated By: Fuller Canada, M.D.   Ct Maxillofacial Wo Cm  01/22/2012  *RADIOLOGY REPORT*  Clinical Data: Preop for a mucocele.  Headaches and facial pain.  CT MAXILLOFACIAL WITHOUT CONTRAST  Technique:  Multidetector CT imaging of the maxillofacial structures was performed. Multiplanar CT image reconstructions were also generated.  Comparison: CT head without contrast 01/19/2012.  Findings: Surgical planning protocol was utilized.  There is an expansile soft tissue mass within the right ethmoid air cells compatible with a mucocele. A more aggressive neoplasm is not excluded, but this  appears to be a benign expansion of the sinus. The right sphenoid sinus is opacified as well.  It is unclear if this represents a mucocele or simply an obstructed sphenoid sinus from the ethmoid mucocele.  Mild mucosal thickening is noted in the left sphenoid sinus.  A fluid level is also present in the left sphenoid sinus.  The right frontal sinus is clear.  The maxillary sinus is clear bilaterally.  The mastoid air cells are clear as well.  IMPRESSION:  1.  Ethmoid mucocele with some expansion of the air cells and soft tissue extending into the upper nasal cavity. 2.  Opacification of the sphenoid sinus is likely secondary to obstruction from the ethmoid mucocele. 3.  Mild left sphenoid sinus disease. 4.  No significant anterior sinus disease.  Original Report Authenticated By: Jamesetta Orleans. MATTERN, M.D.   Pathology as above    IMPRESSION/PLAN: This is a very pleasant 52 year old gentleman who is in fairly good health although he does have a history of alcohol abuse and has been counseled on this by medical oncology. He was recently diagnosed with a plasmacytoma of the right ethmoid sinus. He is undergoing staging studies to rule out multiple myeloma.  I told the patient and his wife that I would recommend radiotherapy for local control and palliation of his symptoms. This would be delivered with curative intent if he is found not to have multiple myeloma. The dose would range somewhere between 25-50.4 gray; the higher dose would be delivered if he is found to have a solitary plasmacytoma.  I reviewed the literature including a multi-institutional publication from [place], published in the International Journal of radiation oncology biology and physics in February 2012. I also reviewed the paper from the [facility] that was published in the same journal in March 2009. Based on these studies, a dose of 50.4 gray in 28 fractions appears to be appropriate with good local control in the range of 90%. There  does not appear to be strong data to warrant empiric coverage of his neck lymph nodes.  Therefore I plan to spare him from the added side effects of empiric nodal radiation, and treat the local region of disease alone with margin, unless his PET scan shows lymph node involvement.  I explained to patient and his wife that I may need an MRI if we treat him to a higher dose. The patient has claustrophobia and wishes to be scheduled for a open MRI if this is necessary.  If the patient is delivered a dose of 50.4 gray, I will use IMRT to spare his globes, optic nerves, cochlea, brain, and brainstem from excessive dose.  The patient, his wife, and I discussed the risks, benefits, and side effects of radiotherapy. No guarantees of treatment were given. A consent form was signed and placed in the patient's medical record.  The patient is enthusiastic about proceeding with treatment. I look forward to participating in the patient's care. I plan to simulate him in 2 days. I will review the results from his staging studies to help determine the dose to be prescribed for his disease.  While I would expect excellent local control, there is still a risk of developing multiple myeloma in the future if he has a solitary plasmacytoma at present. I would estimate this risk of distant failure to be somewhere in the range of 40-50% based on the medical literature that I reviewed. I did speak with the patient and his wife in general terms about his overall prognosis and assured them that we will try to procure disease-free survival as long as we can.

## 2012-02-10 NOTE — Progress Notes (Signed)
Had Bone Marrow Biopsy 02/09/12.  Denies any post procedure pain.

## 2012-02-10 NOTE — Progress Notes (Signed)
[facility] Radiation Oncology Review Of Systems  Name: VADA YELLEN MRN: [id number]  Date:  02/10/2012            DOB: 1961/10/09  Status:outpatient   Drug Allergies: No Known Allergies  Symptoms since last visit at this office:  CONSTITUTIONAL: Pain  Throbbing in right Eye with twitching  EYES:Other Eye Problems See note above  EARS/NOSE/THROAT: Nose Bleeding has resolved HEART:None  LUNG: None  STOMACH/BOWEL: None  GENITOURINARY: None  BREASTS: None  SKIN: None  MUSCLE/BONES: None  NERVOUS SYSTEM:Dizziness if he gets up quickly after lying down MENTAL HEALTH:Anxiety on meds  HORMONES/REPRODUCTIVE: None  BLOOD/LYMPH SYSTEM: None  IMMUNE: None  ADDITIONAL INFORMATION/CONCERNS:

## 2012-02-11 ENCOUNTER — Encounter (HOSPITAL_COMMUNITY)
Admission: RE | Admit: 2012-02-11 | Discharge: 2012-02-11 | Disposition: A | Discharge status: Home / Self Care | Payer: No Typology Code available for payment source | Source: Ambulatory Visit | Attending: Oncology | Admitting: Oncology

## 2012-02-11 DIAGNOSIS — I10 Essential (primary) hypertension: Secondary | ICD-10-CM

## 2012-02-11 DIAGNOSIS — D729 Disorder of white blood cells, unspecified: Secondary | ICD-10-CM

## 2012-02-11 DIAGNOSIS — C9030 Solitary plasmacytoma not having achieved remission: Secondary | ICD-10-CM | POA: Insufficient documentation

## 2012-02-11 DIAGNOSIS — K7689 Other specified diseases of liver: Secondary | ICD-10-CM | POA: Insufficient documentation

## 2012-02-11 DIAGNOSIS — J3489 Other specified disorders of nose and nasal sinuses: Secondary | ICD-10-CM | POA: Insufficient documentation

## 2012-02-11 LAB — IFE INTERPRETATION

## 2012-02-11 LAB — PROTEIN ELECTROPHORESIS, SERUM, WITH REFLEX
Alpha-1-Globulin: 4 % (ref 2.9–4.9)
Alpha-2-Globulin: 8.3 % (ref 7.1–11.8)
Beta 2: 6.7 % — ABNORMAL HIGH (ref 3.2–6.5)
Gamma Globulin: 13.8 % (ref 11.1–18.8)

## 2012-02-11 LAB — IGG, IGA, IGM: IgG (Immunoglobin G), Serum: 903 mg/dL (ref 650–1600)

## 2012-02-11 MED ORDER — FLUDEOXYGLUCOSE F - 18 (FDG) INJECTION
19.0000 | Freq: Once | INTRAVENOUS | Status: AC | PRN
  Administered 2012-02-11: 19 via INTRAVENOUS

## 2012-02-12 ENCOUNTER — Telehealth: Payer: Self-pay | Admitting: *Deleted

## 2012-02-12 ENCOUNTER — Ambulatory Visit (HOSPITAL_BASED_OUTPATIENT_CLINIC_OR_DEPARTMENT_OTHER): Payer: No Typology Code available for payment source | Admitting: Oncology

## 2012-02-12 ENCOUNTER — Ambulatory Visit
Admission: RE | Admit: 2012-02-12 | Discharge: 2012-02-12 | Disposition: A | Discharge status: Home / Self Care | Payer: No Typology Code available for payment source | Source: Ambulatory Visit | Attending: Radiation Oncology | Admitting: Radiation Oncology

## 2012-02-12 ENCOUNTER — Other Ambulatory Visit: Payer: Self-pay | Admitting: Radiation Oncology

## 2012-02-12 VITALS — BP 119/82 | HR 90 | Temp 97.5°F | Ht 74.0 in | Wt 283.9 lb

## 2012-02-12 DIAGNOSIS — R7989 Other specified abnormal findings of blood chemistry: Secondary | ICD-10-CM

## 2012-02-12 DIAGNOSIS — D696 Thrombocytopenia, unspecified: Secondary | ICD-10-CM

## 2012-02-12 DIAGNOSIS — C311 Malignant neoplasm of ethmoidal sinus: Secondary | ICD-10-CM

## 2012-02-12 DIAGNOSIS — C9030 Solitary plasmacytoma not having achieved remission: Secondary | ICD-10-CM

## 2012-02-12 NOTE — Progress Notes (Signed)
[facility]  Telephone:([phone] Fax:([phone]   OFFICE PROGRESS NOTE   Cc:  Colette Ribas, MD, MD  DIAGNOSIS:  Solitary right ethmoid sinus plasmacytoma.   PAST THERAPY:  Biopsy only   CURRENT THERAPY:  Due to receive radiation next week.   INTERVAL HISTORY: ARISTON GRANDISON 51 y.o. male returns to clinic today with his wife to go over result of work up.   He has intermittent pain right retro orbital and headache.  These have not been much problem since I saw him earlier this week.  He denies seizure, diplopia.   Past Medical History  Diagnosis Date  . Hypertension   . Plasmacytoma      Right Ethmoid Sinus  . Hyperlipidemia     diet control   . AVN (avascular necrosis of bone)     bilateral hips; s/p hip replacement in 1991 and 2009  . Headache 02/08/2012    Right Sided Headache Behind Right Eye  . Epistaxis 02/08/2012  . Blurred vision     Present for "many years"  . Fatigue   . Acid reflux     Past Surgical History  Procedure Date  . Bilateral hip replacement 1991; and 2009    due to bilateral hip AVN's.   . Mass biopsy 01/26/12    Right Ethmoid Mass - Plasma Cell Neoplasm    Current Outpatient Prescriptions  Medication Sig Dispense Refill  . escitalopram (LEXAPRO) 20 MG tablet Take 10 mg by mouth daily.      Marland Kitchen HYDROcodone-acetaminophen (NORCO) 5-325 MG per tablet Take 1 tablet by mouth every 6 (six) hours as needed.      . lansoprazole (PREVACID) 15 MG capsule Take 15 mg by mouth daily.      Marland Kitchen LORazepam (ATIVAN) 1 MG tablet Take 1 mg by mouth every 8 (eight) hours as needed. For anxiety      . olmesartan (BENICAR) 20 MG tablet Take 10 mg by mouth daily.        ALLERGIES:   has no known allergies.  REVIEW OF SYSTEMS:  The rest of the 14-point review of system was negative.   Filed Vitals:   02/12/12 1409  BP: 119/82  Pulse: 90  Temp: 97.5 F (36.4 C)   Wt Readings from Last 3 Encounters:  02/12/12 283 lb 14.4 oz (128.776  kg)  02/10/12 284 lb (128.822 kg)  02/10/12 284 lb 12.8 oz (129.184 kg)   ECOG Performance status: 0  PHYSICAL EXAMINATION:  General:  well-nourished in no acute distress.  Eyes:  no scleral icterus.  EOM intact.  ENT:  There were no oropharyngeal lesions.  Neck was without thyromegaly.  Lymphatics:  Negative cervical, supraclavicular or axillary adenopathy.  Respiratory: lungs were clear bilaterally without wheezing or crackles.  Cardiovascular:  Regular rate and rhythm, S1/S2, without murmur, rub or gallop.  There was no pedal edema.  GI:  abdomen was soft, flat, nontender, nondistended, without organomegaly.  Muscoloskeletal:  no spinal tenderness of palpation of vertebral spine.  Skin exam was without echymosis, petichae.  Neuro exam was nonfocal.       LABORATORY/RADIOLOGY DATA:  Lab Results  Component Value Date   WBC 5.7 02/08/2012   HGB 14.1 02/08/2012   HCT 40.6 02/08/2012   PLT 139* 02/08/2012   GLUCOSE 117* 02/08/2012   ALKPHOS 71 02/08/2012   ALT 83* 02/08/2012   AST 40* 02/08/2012   NA 136 02/08/2012   K 4.2 02/08/2012   CL 102  02/08/2012   CREATININE 0.99 02/08/2012   BUN 15 02/08/2012   CO2 27 02/08/2012    RADIOLOGY:  I personally reviewed the following PET scan and bone survey and showed the images to the patient and his wife.    Nm Pet Image Initial (pi) Skull Base To Thigh  02/11/2012  *RADIOLOGY REPORT*  Clinical Data: Initial treatment strategy for sinus plasmacytoma.  NUCLEAR MEDICINE PET SKULL BASE TO THIGH  Fasting Blood Glucose:  86  Technique:  19.0 mCi F-18 FDG was injected intravenously. CT data was obtained and used for attenuation correction and anatomic localization only.  (This was not acquired as a diagnostic CT examination.) Additional exam technical data entered on technologist worksheet.  Comparison:  Maxillofacial CT 01/22/2012.  Findings:  Neck: Expansile soft tissue in the right ethmoid sinus and sphenoid sinuses has an S U V max of 24.6.  No additional  areas of abnormal hypermetabolism in the neck.  CT images show no acute findings.  Chest:  Note is made of mild misregistration artifact. No hypermetabolic mediastinal or hilar nodes.  No suspicious pulmonary nodules on the CT scan. CT images show no acute findings.  No pericardial or pleural effusion.  Abdomen/Pelvis:  Apparent hypermetabolism in the left hepatic lobe is felt to be due to misregistration with the adjacent stomach. No abnormal hypermetabolic activity within the liver, pancreas, adrenal glands, or spleen.  No hypermetabolic lymph nodes in the abdomen or pelvis. CT images show low attenuation throughout the liver, with some sparing adjacent to the gallbladder fossa. No acute findings.  [place]:  No focal hypermetabolic activity to suggest skeletal metastasis. CT images suggest avascular necrosis in the left femoral head.  IMPRESSION:  1.  Hypermetabolic expansile soft tissue mass in the right ethmoid sinus, most consistent with malignancy.  Hypermetabolism in the opacified sphenoid sinuses may be due to malignancy and/or obstruction.  No evidence of distant metastatic disease. 2.  Hepatic steatosis. 3.  Suspect avascular necrosis of the left femoral head.  Original Report Authenticated By: Reyes Ivan, M.D.   Dg Bone Survey Met  02/11/2012  *RADIOLOGY REPORT*  Clinical Data: Plasmacytoma.  METASTATIC BONE SURVEY - 17 view  Comparison: None.  Findings: No focal lytic bone lesions are seen within the axial or proximal appendicular skeleton.  Mild degenerative disc disease is seen at multiple levels throughout the spine.  Left hip prosthesis is present as well as compression screw - plate fixation device in the left hip.  Old calcified bone infarct is seen in the right femoral diaphysis.  IMPRESSION: No suspicious lytic bone lesions identified.  Original Report Authenticated By: Danae Orleans, M.D.     ASSESSMENT AND PLAN:    1. EtOH: stop EtOH.   2. Elevated LFT:  - most likely due to  EtOH.  There was no sign of cirrhosis or liver abnormality on CT portion of PET scan.  Will f/u LFT in the future.   3. Thrombocytopenia:  - most likely due to [facility]. Bone marrow was negative.    4. Solitary right ethmoid sinus plasmacytoma: - Diagnosis:  No evidence of myeloma on SPEP, bone marrow biopsy.  Only slightly elevated kappa light chain which can be non specific.   - I personally think that systemic chemotherapy such as Revlimid/Velcade/Dex that are normally used for myeloma would be too aggressive.  This disease entity is responsive to radiation.  I recommended proceeding with radiation with Dr. Basilio Cairo.  In the future, if his disease does not  respond or he develops systemic myeloma, then such chemo may be considered.  Mr. Riviera and his wife expressed informed understanding and wished to proceed with stated plan.   5. Follow up:  - CT maxillafacial with contrast; SPEP/ light chain in about 3 months.     The length of time of the face-to-face encounter was 15  minutes. More than 50% of time was spent counseling and coordination of care.

## 2012-02-12 NOTE — Telephone Encounter (Signed)
Called patient to inform of test for 02-18-12- arrival time - 6:30 pm at [facility] - [address] [place] for a return call

## 2012-02-12 NOTE — Telephone Encounter (Signed)
gave patient appointment for 05-18-2012 starting with ct and lab all on the same day per the orders from 02-12-2012 printed out calendar and gave to the patient

## 2012-02-12 NOTE — Progress Notes (Signed)
Simulation treatment planning note The patient was taken to the CT simulator and laid in the supine position. An Aquaplast head and shoulder mask with custom fitted to his anatomy. High-resolution CT axial imaging was obtained of his head and neck without contrast. I verified that the imaging was good quality for treatment planning.  Treatment planning note: the patient's staging studies and bone marrow biopsy results revealed no clear  evidence of multiple myeloma at this time. I discussed this with medical oncology. He'll be treated for a solitary plasmacytoma. I will treat him with IMRT in order to spare his adjacent organs. I plan to treat to 5040 cGy in 28 fractions of 180 cGy per fraction.  I have ordered an MRI to be conducted with with and without contrast of the patient's orbits and sinuses to help me delineate his tumor volume during treatment planning.  IMRT planning Note IMRT is an important modality to deliver adequate dose to the patient's at risk tissues while sparing the patient's normal structures, including the: Optic nerves, optic chiasm, brain, globes . This justifies the use of IMRT in the patient's treatment.

## 2012-02-15 LAB — UIFE/LIGHT CHAINS/TP QN, 24-HR UR
Albumin, U: DETECTED
Alpha 2, Urine: DETECTED — AB
Beta, Urine: DETECTED — AB
Gamma Globulin, Urine: DETECTED — AB
Total Protein, Urine-Ur/day: 94 mg/d (ref 10–140)
Volume, Urine: 1700 mL

## 2012-02-16 ENCOUNTER — Telehealth: Payer: Self-pay | Admitting: Radiation Oncology

## 2012-02-16 ENCOUNTER — Encounter: Payer: Self-pay | Admitting: Radiation Oncology

## 2012-02-18 ENCOUNTER — Ambulatory Visit
Admission: RE | Admit: 2012-02-18 | Discharge: 2012-02-18 | Disposition: A | Discharge status: Home / Self Care | Payer: No Typology Code available for payment source | Source: Ambulatory Visit | Attending: Radiation Oncology | Admitting: Radiation Oncology

## 2012-02-18 MED ORDER — GADOBENATE DIMEGLUMINE 529 MG/ML IV SOLN
20.0000 mL | Freq: Once | INTRAVENOUS | Status: AC | PRN
  Administered 2012-02-18: 20 mL via INTRAVENOUS

## 2012-02-23 ENCOUNTER — Telehealth: Payer: Self-pay | Admitting: *Deleted

## 2012-02-23 NOTE — Telephone Encounter (Addendum)
Called Ms. Jimmye Norman after receiving a message for the therapist that Mr Braulio Bosch was experiencing more since he had his simulation.  Therapist actually called to inform Ms. Schwinn that treatment would not start until 02/29/12.  Ms. Thall relayed that now Mr Braulio Bosch now has overall headache instead of headache over his right eye as he had on his [place] visit and whenever he stands " pain shoots to the top of his head".  He has been lying down the majority of the time for the past 2 days.  He is not obtaining minimal relief with 2 Lortab 7.5/500 mg tabs.  He also has increased blurred vision but denies any other symptoms at this time.  After conferring with Dr. Dayton Scrape, he wrote a prescription for Oxycodone 5/325 mg tabs and wife will pick up prescription this evening.

## 2012-02-25 ENCOUNTER — Ambulatory Visit: Payer: No Typology Code available for payment source

## 2012-02-26 ENCOUNTER — Ambulatory Visit: Payer: No Typology Code available for payment source

## 2012-02-26 NOTE — Progress Notes (Signed)
Encounter addended by: Delynn Flavin, RN on: 02/26/2012  4:37 PM<BR>     Documentation filed: Charges VN

## 2012-02-29 ENCOUNTER — Ambulatory Visit
Admission: RE | Admit: 2012-02-29 | Discharge: 2012-02-29 | Disposition: A | Discharge status: Home / Self Care | Payer: No Typology Code available for payment source | Source: Ambulatory Visit | Attending: Radiation Oncology | Admitting: Radiation Oncology

## 2012-02-29 ENCOUNTER — Encounter: Payer: Self-pay | Admitting: Radiation Oncology

## 2012-02-29 VITALS — BP 124/87 | HR 74 | Temp 98.7°F | Wt 279.1 lb

## 2012-02-29 DIAGNOSIS — C311 Malignant neoplasm of ethmoidal sinus: Secondary | ICD-10-CM

## 2012-02-29 MED ORDER — BIAFINE EX EMUL
Freq: Two times a day (BID) | CUTANEOUS | Status: DC
  Administered 2012-02-29: 1 via TOPICAL
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
## 2012-02-29 NOTE — Progress Notes (Addendum)
Grades pain as a level 1-2 on a scale of 0-10 - right. head and face.  First treatment today. Black eye left.   Post Sim education today.  Reviewed schedule, daily registration process, weekly Monday visit with Dr. Basilio Cairo, management of skin , pain, fatigue, sore mouth and diet during radiation therapy.  Given Radiation Therapy and You booklet

## 2012-03-01 ENCOUNTER — Ambulatory Visit
Admission: RE | Admit: 2012-03-01 | Discharge: 2012-03-01 | Disposition: A | Discharge status: Home / Self Care | Payer: No Typology Code available for payment source | Source: Ambulatory Visit | Attending: Radiation Oncology | Admitting: Radiation Oncology

## 2012-03-02 ENCOUNTER — Ambulatory Visit
Admission: RE | Admit: 2012-03-02 | Discharge: 2012-03-02 | Disposition: A | Discharge status: Home / Self Care | Payer: No Typology Code available for payment source | Source: Ambulatory Visit | Attending: Radiation Oncology | Admitting: Radiation Oncology

## 2012-03-03 ENCOUNTER — Ambulatory Visit
Admission: RE | Admit: 2012-03-03 | Discharge: 2012-03-03 | Disposition: A | Discharge status: Home / Self Care | Payer: No Typology Code available for payment source | Source: Ambulatory Visit | Attending: Radiation Oncology | Admitting: Radiation Oncology

## 2012-03-04 ENCOUNTER — Ambulatory Visit
Admission: RE | Admit: 2012-03-04 | Discharge: 2012-03-04 | Disposition: A | Discharge status: Home / Self Care | Payer: No Typology Code available for payment source | Source: Ambulatory Visit | Attending: Radiation Oncology | Admitting: Radiation Oncology

## 2012-03-04 NOTE — Progress Notes (Signed)
IMRT Device Note   5.3 delivered field widths represent one set of IMRT treatment devices. The code is [id number].

## 2012-03-08 ENCOUNTER — Ambulatory Visit
Admission: RE | Admit: 2012-03-08 | Discharge: 2012-03-08 | Disposition: A | Discharge status: Home / Self Care | Payer: No Typology Code available for payment source | Source: Ambulatory Visit | Attending: Radiation Oncology | Admitting: Radiation Oncology

## 2012-03-09 ENCOUNTER — Ambulatory Visit
Admission: RE | Admit: 2012-03-09 | Discharge: 2012-03-09 | Disposition: A | Discharge status: Home / Self Care | Payer: No Typology Code available for payment source | Source: Ambulatory Visit | Attending: Radiation Oncology | Admitting: Radiation Oncology

## 2012-03-09 ENCOUNTER — Encounter: Payer: Self-pay | Admitting: Radiation Oncology

## 2012-03-09 VITALS — BP 123/86 | HR 82 | Temp 98.4°F | Wt 275.8 lb

## 2012-03-09 DIAGNOSIS — C311 Malignant neoplasm of ethmoidal sinus: Secondary | ICD-10-CM

## 2012-03-09 MED ORDER — PROMETHAZINE HCL 25 MG PO TABS: 25.0000 mg | ORAL_TABLET | Freq: Four times a day (QID) | ORAL | Status: DC | PRN

## 2012-03-09 MED ORDER — OXYCODONE-ACETAMINOPHEN 5-325 MG PO TABS: 1.0000 | ORAL_TABLET | ORAL | Status: AC | PRN

## 2012-03-09 NOTE — Progress Notes (Signed)
   Weekly Management Note Current Dose: 1260  cGy  Projected Dose:5040  cGy   Narrative:  The patient presents for routine under treatment assessment.  CBCT/MVCT images/Port film x-rays were reviewed.  The chart was checked. Reports Sulfur smell when food is cooking, when shaving or putting on cologne. He has dry heaves in the AM. Has anxiety for which he takes Ativan. Still having some headaches, alleviated by Percocet  Physical Findings: Weight: 275 lb 12.8 oz (125.102 kg). 4 pounds less than last week. No skin changes or eye irritation noted.  Impression:  The patient is tolerating radiotherapy.  Plan:  Continue radiotherapy as planned. I offered a referral to a counselor and cancer Center but he is not interested. I gave him a prescription for Phenergan as well as a refill of his Percocet.

## 2012-03-09 NOTE — Progress Notes (Signed)
7/28 fractions.  Reports Sulfur smell when food is cooking, when shaving or putting on cologne.  Experiencing dry heaves throughout the day and upon awakening.  Does not have any anti-emetics.  Takes prevacid for acid reflux.    Last wt 279 on 02/29/12. .  Redness noted right cheek region and nose.   Does complain of fatigue.

## 2012-03-10 ENCOUNTER — Ambulatory Visit
Admission: RE | Admit: 2012-03-10 | Discharge: 2012-03-10 | Disposition: A | Discharge status: Home / Self Care | Payer: No Typology Code available for payment source | Source: Ambulatory Visit | Attending: Radiation Oncology | Admitting: Radiation Oncology

## 2012-03-11 ENCOUNTER — Ambulatory Visit: Payer: No Typology Code available for payment source

## 2012-03-14 ENCOUNTER — Ambulatory Visit
Admission: RE | Admit: 2012-03-14 | Discharge: 2012-03-14 | Disposition: A | Discharge status: Home / Self Care | Payer: No Typology Code available for payment source | Source: Ambulatory Visit | Attending: Radiation Oncology | Admitting: Radiation Oncology

## 2012-03-14 ENCOUNTER — Encounter: Payer: Self-pay | Admitting: Radiation Oncology

## 2012-03-14 DIAGNOSIS — C311 Malignant neoplasm of ethmoidal sinus: Secondary | ICD-10-CM

## 2012-03-14 DIAGNOSIS — R110 Nausea: Secondary | ICD-10-CM

## 2012-03-14 MED ORDER — ONDANSETRON HCL 8 MG PO TABS: 8.0000 mg | ORAL_TABLET | Freq: Three times a day (TID) | ORAL | Status: AC | PRN

## 2012-03-14 NOTE — Progress Notes (Signed)
HERE TODAY FOR PUT OF SINUS.  SKIN RED WITH WHAT APPEARS TO BE RADIATION DERMATITIS, SAYS IT DOESN'T ITCH.  HAD AN EPISODE LAST Friday OF N/V, WAS UNABLE TO COME FOR TX.  NO NAUSEA TODAY.  EATING OK AND SWALLOWING OK     VS.....136/93....Marland KitchenMarland Kitchen85        18

## 2012-03-14 NOTE — Progress Notes (Signed)
   Weekly Management Note plasmacytoma, ethmoid sinus Current Dose: 1620  cGy  Projected Dose:  5040 cGy   Narrative:  The patient presents for routine under treatment assessment.  CBCT/MVCT images/Port film x-rays were reviewed.  The chart was checked. He still has nausea but has not been taking the Phenergan frequently as it makes him drowsy. He did not make his last scheduled treatment on 5/31. When he watches TV, his eyes water excessively  Physical Findings: Weight is 275 pounds.  vitals were not taken for this visit. he is in no acute distress. He has mild erythema over his face. Eyes do not appear irritated  Impression:  The patient is tolerating radiotherapy.  Plan:  Continue radiotherapy as planned. Due to inability to tolerate Phenergan, and the fact that his nausea has been refractory to Ativan, I will prescribe Zofran. Patient was urged to try to comply with all scheduled radiotherapy, and understands that missing multiple scheduled treatments could interfere with the efficacy of radiation. Artificial tears were recommended for his excessive lacrimation

## 2012-03-15 ENCOUNTER — Ambulatory Visit
Admission: RE | Admit: 2012-03-15 | Discharge: 2012-03-15 | Disposition: A | Discharge status: Home / Self Care | Payer: No Typology Code available for payment source | Source: Ambulatory Visit | Attending: Radiation Oncology | Admitting: Radiation Oncology

## 2012-03-16 ENCOUNTER — Ambulatory Visit
Admission: RE | Admit: 2012-03-16 | Discharge: 2012-03-16 | Disposition: A | Discharge status: Home / Self Care | Payer: No Typology Code available for payment source | Source: Ambulatory Visit | Attending: Radiation Oncology | Admitting: Radiation Oncology

## 2012-03-17 ENCOUNTER — Ambulatory Visit
Admission: RE | Admit: 2012-03-17 | Discharge: 2012-03-17 | Disposition: A | Discharge status: Home / Self Care | Payer: No Typology Code available for payment source | Source: Ambulatory Visit | Attending: Radiation Oncology | Admitting: Radiation Oncology

## 2012-03-18 ENCOUNTER — Ambulatory Visit
Admission: RE | Admit: 2012-03-18 | Discharge: 2012-03-18 | Disposition: A | Discharge status: Home / Self Care | Payer: No Typology Code available for payment source | Source: Ambulatory Visit | Attending: Radiation Oncology | Admitting: Radiation Oncology

## 2012-03-21 ENCOUNTER — Ambulatory Visit
Admission: RE | Admit: 2012-03-21 | Discharge: 2012-03-21 | Disposition: A | Discharge status: Home / Self Care | Payer: No Typology Code available for payment source | Source: Ambulatory Visit | Attending: Radiation Oncology | Admitting: Radiation Oncology

## 2012-03-21 ENCOUNTER — Encounter: Payer: Self-pay | Admitting: Radiation Oncology

## 2012-03-21 VITALS — BP 112/79 | HR 84 | Resp 18 | Wt 276.6 lb

## 2012-03-21 DIAGNOSIS — C311 Malignant neoplasm of ethmoidal sinus: Secondary | ICD-10-CM

## 2012-03-21 NOTE — Progress Notes (Signed)
Patient presents to the clinic today accompanied by his wife for a PUT with Dr. Basilio Cairo. Patient is alert and oriented to person, place, and time. No distress noted. Steady gait noted. Pleasant affect noted. Patient denies pain at this time. Patient reports that blurred vision continues. Patient reports his eyes water continuously. Patient reports using moisturizing eye drops as directed. Patient reports nausea has improved. Patient denies headaches and dizziness. Patient reports eating and sleeping without difficulty. Patient has no other complaints. Reported all findings to Dr. Basilio Cairo.

## 2012-03-21 NOTE — Progress Notes (Signed)
   Weekly Management Note: plasmacytoma, ethmoid sinus  Current Dose:   2520cGy  Projected Dose: 5040 cGy   Narrative:  The patient presents for routine under treatment assessment.  CBCT/MVCT images/Port film x-rays were reviewed.  The chart was checked. He is doing better. Nausea is well controlled on ondansetron. Eyes continue to water with a burning sensation. He is using artificial tears. No headaches.  Physical Findings:  weight is 276 lb 9.6 oz (125.465 kg). His blood pressure is 112/79 and his pulse is 84. His respiration is 18.  no conjunctivitis. No excessive watering of his eyes apparent on exam. No scleral irritation. His skin continues to have an erythematous appearance over his face with no desquamation. No hair loss  Impression:  The patient is tolerating radiotherapy.  Plan:  Continue radiotherapy as planned. Continue ondansetron. Continue artificial tears. I told the patient that he will continue to have tearing of his eyes and a burning sensation through radiotherapy due to the proximity of his tumor to his eyes.   ___   Lonie Peak, M.D.

## 2012-03-22 ENCOUNTER — Ambulatory Visit
Admission: RE | Admit: 2012-03-22 | Discharge: 2012-03-22 | Disposition: A | Discharge status: Home / Self Care | Payer: No Typology Code available for payment source | Source: Ambulatory Visit | Attending: Radiation Oncology | Admitting: Radiation Oncology

## 2012-03-23 ENCOUNTER — Ambulatory Visit
Admission: RE | Admit: 2012-03-23 | Discharge: 2012-03-23 | Disposition: A | Discharge status: Home / Self Care | Payer: No Typology Code available for payment source | Source: Ambulatory Visit | Attending: Radiation Oncology | Admitting: Radiation Oncology

## 2012-03-24 ENCOUNTER — Ambulatory Visit
Admission: RE | Admit: 2012-03-24 | Discharge: 2012-03-24 | Disposition: A | Discharge status: Home / Self Care | Payer: No Typology Code available for payment source | Source: Ambulatory Visit | Attending: Radiation Oncology | Admitting: Radiation Oncology

## 2012-03-25 ENCOUNTER — Ambulatory Visit
Admission: RE | Admit: 2012-03-25 | Discharge: 2012-03-25 | Disposition: A | Discharge status: Home / Self Care | Payer: No Typology Code available for payment source | Source: Ambulatory Visit | Attending: Radiation Oncology | Admitting: Radiation Oncology

## 2012-03-28 ENCOUNTER — Ambulatory Visit
Admission: RE | Admit: 2012-03-28 | Discharge: 2012-03-28 | Disposition: A | Discharge status: Home / Self Care | Payer: No Typology Code available for payment source | Source: Ambulatory Visit | Attending: Radiation Oncology | Admitting: Radiation Oncology

## 2012-03-29 ENCOUNTER — Ambulatory Visit
Admission: RE | Admit: 2012-03-29 | Discharge: 2012-03-29 | Disposition: A | Discharge status: Home / Self Care | Payer: No Typology Code available for payment source | Source: Ambulatory Visit | Attending: Radiation Oncology | Admitting: Radiation Oncology

## 2012-03-29 ENCOUNTER — Encounter: Payer: Self-pay | Admitting: Radiation Oncology

## 2012-03-29 VITALS — BP 113/75 | HR 81 | Resp 18 | Wt 274.0 lb

## 2012-03-29 DIAGNOSIS — C311 Malignant neoplasm of ethmoidal sinus: Secondary | ICD-10-CM

## 2012-03-29 MED ORDER — DEXAMETHASONE 4 MG PO TABS: 4.0000 mg | ORAL_TABLET | Freq: Three times a day (TID) | ORAL | Status: AC

## 2012-03-29 MED ORDER — ONDANSETRON HCL 8 MG PO TABS: 8.0000 mg | ORAL_TABLET | Freq: Three times a day (TID) | ORAL | Status: DC | PRN

## 2012-03-29 NOTE — Progress Notes (Signed)
Patient presents to the clinic today accompanied by his wife for a PUT with Dr. Basilio Cairo. Patient is alert and oriented to person, place, and time. No distress noted. Steady gait noted. Pleasant affect noted. Patient reports sharp right sided lower back pain 5-6 on a scale of 0-10. Patient reports heating pad and tylenol help relieve the pain somewhat but, it has persisted since Friday. Patient reports sometimes walking also help to ease the back pain. Patient reports taste and smell changes. Patient denies headaches or dizziness. Patient reports blurred vision is worse. Patient reports dry eye is better with daily eye drops. Patient reports energy level remains the same. Patient report appetite is "ok" Reported all findings to Dr. Basilio Cairo. Two pound weight loss noted since 03/21/2012. Patient reports his nose is dry and scabbed over on the inside. Also, patient reports that hair above both ears is beginning to fall out.  Reported all findings to Dr. Basilio Cairo.

## 2012-03-29 NOTE — Progress Notes (Addendum)
   Weekly Management Note: plasmacytoma of the ethmoid sinus Current Dose: 3600 cGy  Projected Dose: 5040 cGy   Narrative:  The patient presents for routine under treatment assessment.  CBCT/MVCT images/Port film x-rays were reviewed.  The chart was checked. Patient reports irritation in his nostrils. He reports that his dry eyes are better with daily artificial tears. He has noticed some alopecia above his ears over his temporal scalp. His headaches are better. His nausea is well controlled with Zofran, he needs a refill of that. He reports that his vision is more blurry than usual. This has evolved since starting treatment. The vision is more blurry in the right side than the left side. Has new lower back pain improved with heating pad, Tylenol.     Physical Findings:  weight is 274 lb (124.286 kg). His blood pressure is 113/75 and his pulse is 81. His respiration is 18.  visual fields are intact but he subjectively reports blurry vision bilaterally, more so in the right eye. EOMI. No scleral or conjunctival erythema. No skin changes. Nasal mucosa demonstrates erythema and small clots of blood.  Tender to palpation in the right musculature of the lower back but not in the spine.   Impression:  The patient is tolerating radiotherapy. Blurry vision is worsening.  Plan:  Continue radiotherapy as planned. I recommended he start Dexamethasone for his blurry vision. I showed him his MRI and proximity of the tumor to the chiasm and optic nerve on the right. I suspect some peritumoral swelling could be responsible. I discussed the risks/benefits/side effects of Dexamethasone. Patient is reluctant to take it. He understands the drug could protect his vision from permanent damage.  He has been given a Rx in case he chooses to take it.  Otherwise, it is available in case his vision worsens further. He was instructed to tell me if it worsens more.  Zofran refilled today as well. For new lower back pain --  continue heating pad, Tylenol.  Seems muscular on exam, but will continue to follow.   ________________________________   Lonie Peak, M.D.

## 2012-03-30 ENCOUNTER — Ambulatory Visit
Admission: RE | Admit: 2012-03-30 | Discharge: 2012-03-30 | Disposition: A | Discharge status: Home / Self Care | Payer: No Typology Code available for payment source | Source: Ambulatory Visit | Attending: Radiation Oncology | Admitting: Radiation Oncology

## 2012-03-31 ENCOUNTER — Ambulatory Visit
Admission: RE | Admit: 2012-03-31 | Discharge: 2012-03-31 | Disposition: A | Discharge status: Home / Self Care | Payer: No Typology Code available for payment source | Source: Ambulatory Visit | Attending: Radiation Oncology | Admitting: Radiation Oncology

## 2012-04-01 ENCOUNTER — Ambulatory Visit
Admission: RE | Admit: 2012-04-01 | Discharge: 2012-04-01 | Disposition: A | Discharge status: Home / Self Care | Payer: No Typology Code available for payment source | Source: Ambulatory Visit | Attending: Radiation Oncology | Admitting: Radiation Oncology

## 2012-04-04 ENCOUNTER — Ambulatory Visit
Admission: RE | Admit: 2012-04-04 | Discharge: 2012-04-04 | Disposition: A | Discharge status: Home / Self Care | Payer: No Typology Code available for payment source | Source: Ambulatory Visit | Attending: Radiation Oncology | Admitting: Radiation Oncology

## 2012-04-04 ENCOUNTER — Encounter: Payer: Self-pay | Admitting: Radiation Oncology

## 2012-04-04 VITALS — BP 105/75 | HR 93 | Resp 18 | Wt 270.8 lb

## 2012-04-04 DIAGNOSIS — C311 Malignant neoplasm of ethmoidal sinus: Secondary | ICD-10-CM

## 2012-04-04 NOTE — Progress Notes (Signed)
Patient presents to the clinic today accompanied by his wife for a PUT with Dr. Basilio Cairo. Patient is alert and oriented to person, place, and time. No distress noted. Steady gait noted. Pleasant affect noted. Patient reports sharp right sided lower back pain 3 on a scale of 0-10. Back pain much improved from last week. Patient reports heating pad and tylenol help relieve the pain. Patient reports sometimes walking also help to ease the back pain. Patient reports taste and smell changes. Patient denies headaches or dizziness. Patient reports blurred vision continues. Patient reports dry eye is better with daily eye drops. Patient reports energy level remains the same. Patient report appetite is "ok" Reported all findings to Dr. Basilio Cairo. Three pound weight loss noted since 03/29/2012. Patient reports his nose is dry and scabbed over on the inside. Patient reports using Aquaphor for dry nose. Patient reports occasionally when he blows his nose in the AM the drainage is blood tinged. Also, patient reports that hair above both ears is beginning to fall out. Recently patient had a hair cut and cleaned up the area above his ears where the hair is falling out. Reported all findings to Dr. Basilio Cairo.

## 2012-04-04 NOTE — Progress Notes (Signed)
   Weekly Management Note: plasmacytoma of the ethmoid sinus Current Dose:  4320 cGy  Projected Dose:  5040cGy   Narrative:  The patient presents for routine under treatment assessment.  CBCT/MVCT images/Port film x-rays were reviewed.  The chart was checked. He is doing relatively well. He does still have blurry vision but is fairly adamant that he doesn't want to take dexamethasone. The blurry vision is stable, it has not gotten worse. His back pain has improved with Tylenol and heating pads. His eyes have done well with daily artificial tears eyedrops. He continues to have some bleeding from his nostrils. He is applying Aquaphor to his nostrils and Biafine elsewhere.  Physical Findings:  weight is 270 lb 12.8 oz (122.834 kg). His blood pressure is 105/75 and his pulse is 93. His respiration is 18.  no scleral / conjunctival erythema. Extraocular movements are intact.  Impression:  The patient is tolerating radiotherapy.  Plan:  Continue radiotherapy as planned. Less than one week ago. Patient will followup with me on August 9, right after CT scans as ordered by medical oncology.  ________________________________   Lonie Peak, M.D.

## 2012-04-05 ENCOUNTER — Ambulatory Visit
Admission: RE | Admit: 2012-04-05 | Discharge: 2012-04-05 | Disposition: A | Discharge status: Home / Self Care | Payer: No Typology Code available for payment source | Source: Ambulatory Visit | Attending: Radiation Oncology | Admitting: Radiation Oncology

## 2012-04-06 ENCOUNTER — Ambulatory Visit
Admission: RE | Admit: 2012-04-06 | Discharge: 2012-04-06 | Disposition: A | Discharge status: Home / Self Care | Payer: No Typology Code available for payment source | Source: Ambulatory Visit | Attending: Radiation Oncology | Admitting: Radiation Oncology

## 2012-04-07 ENCOUNTER — Ambulatory Visit
Admission: RE | Admit: 2012-04-07 | Discharge: 2012-04-07 | Disposition: A | Discharge status: Home / Self Care | Payer: No Typology Code available for payment source | Source: Ambulatory Visit | Attending: Radiation Oncology | Admitting: Radiation Oncology

## 2012-04-08 ENCOUNTER — Ambulatory Visit
Admission: RE | Admit: 2012-04-08 | Discharge: 2012-04-08 | Disposition: A | Discharge status: Home / Self Care | Payer: No Typology Code available for payment source | Source: Ambulatory Visit | Attending: Radiation Oncology | Admitting: Radiation Oncology

## 2012-04-08 ENCOUNTER — Encounter: Payer: Self-pay | Admitting: Radiation Oncology

## 2012-04-08 ENCOUNTER — Ambulatory Visit: Payer: No Typology Code available for payment source

## 2012-04-08 VITALS — BP 123/88 | HR 85 | Temp 98.4°F | Wt 271.3 lb

## 2012-04-08 DIAGNOSIS — C311 Malignant neoplasm of ethmoidal sinus: Secondary | ICD-10-CM

## 2012-04-08 DIAGNOSIS — I10 Essential (primary) hypertension: Secondary | ICD-10-CM

## 2012-04-08 DIAGNOSIS — D729 Disorder of white blood cells, unspecified: Secondary | ICD-10-CM

## 2012-04-08 MED ORDER — ONDANSETRON HCL 8 MG PO TABS: 8.0000 mg | ORAL_TABLET | Freq: Three times a day (TID) | ORAL | Status: AC | PRN

## 2012-04-08 MED ORDER — HYDROCODONE-ACETAMINOPHEN 5-325 MG PO TABS: 1.0000 | ORAL_TABLET | Freq: Four times a day (QID) | ORAL | Status: DC | PRN

## 2012-04-08 MED ORDER — BIAFINE EX EMUL
CUTANEOUS | Status: DC | PRN
  Administered 2012-04-08: 1 via TOPICAL

## 2012-04-08 NOTE — Progress Notes (Signed)
Patient completes 28  radiation treatments of sinus tumor today.Denies pain but does request refill Patient skin red in treatment field.Will give additional tube of biafine to apply 2 to 3 times daily. One month follow up card already given.

## 2012-04-08 NOTE — Progress Notes (Signed)
   Weekly Management Note Completed Radiotherapy. Total Dose:  5040 cGy   Narrative:  The patient presents for routine under treatment assessment on last day of radiotherapy.  CBCT/MVCT images/Port film x-rays were reviewed.  The chart was checked. No new complaints. Vision is stable. He needs refills on ondansetron and hydrocodone  Physical Findings:  weight is 271 lb 4.8 oz (123.061 kg). His temperature is 98.4 F (36.9 C). His blood pressure is 123/88 and his pulse is 85.  no acute distress. Skin is intact.  Impression:  The patient has tolerated radiotherapy.  Plan:  Routine follow-up in one month. Refills provided for ondansetron/ hydrocodone. ________________________________   Lonie Peak, M.D.

## 2012-04-11 ENCOUNTER — Ambulatory Visit: Payer: No Typology Code available for payment source

## 2012-04-12 ENCOUNTER — Ambulatory Visit: Payer: No Typology Code available for payment source

## 2012-04-13 ENCOUNTER — Ambulatory Visit: Payer: No Typology Code available for payment source

## 2012-04-15 ENCOUNTER — Ambulatory Visit: Payer: No Typology Code available for payment source

## 2012-04-20 NOTE — Progress Notes (Addendum)
  Radiation Oncology         [phone]) [phone] ________________________________  Name: Austin Griffith MRN: [id number]  Date: 04/08/2012  DOB: 05/21/1961  End of Treatment Note  Diagnosis:  Plasmacytoma of the head and neck region, right ethmoid sinus  Indication for treatment:  curative    Radiation treatment dates:  02/29/2012-04/08/2012  Site/dose:  Ethmoid sinus tumor/50.4 gray in 28 fractions  Beams/energy:   Helical IMRT /6 MV photons  Narrative: The patient tolerated radiation treatment relatively well.  Patient did have blurry vision and Decadron was recommended for this; however, the patient remained adamant that he didn't want to risk the side effects of Decadron for his symptoms. A prescription for Decadron was given in case he changed his mind after completion of radiotherapy. Artificial tear drops adequately alleviated his watery/burning eyes. Ondansetron alleviated his nausea. Percocet alleviated his headaches.   Plan: The patient has completed radiation treatment. The patient will return to radiation oncology clinic for routine followup in one month. I advised them to call or return sooner if they have any questions or concerns related to their recovery or treatment.  -----------------------------------  Lonie Peak, MD

## 2012-04-26 ENCOUNTER — Telehealth: Payer: Self-pay | Admitting: *Deleted

## 2012-04-26 NOTE — Telephone Encounter (Signed)
Dayvin Aber wife Angie called to report that Corrie started his Dexamethasone tappered dose and is now taking 4mg  po BID.  Additionally, he is have periods of light-headeness and angie reports that his systolic BP is in the low 90's x 2 today.  She actually halfed his daily dosage of Benicar from 20 mg to 10 mg but is concerned.  After discussion she stated that she will hold it tomorrow and monitor his BP frequently to see if his status changes.  Angie even bought a glucometer to check Axell's BP and it was 106.  She states he is eating and drinking "well". Cautioned her to make sure Ishaan moves slowly and when he is changing position from lying to sitting or sitting to standing to remain stationary until his body adapts to the position change.  She stated understanding and it was agreed that she would call on tomorrow  to report on his status.

## 2012-04-27 ENCOUNTER — Encounter: Payer: Self-pay | Admitting: Radiation Oncology

## 2012-05-18 ENCOUNTER — Other Ambulatory Visit (HOSPITAL_COMMUNITY): Payer: No Typology Code available for payment source

## 2012-05-18 ENCOUNTER — Other Ambulatory Visit: Payer: No Typology Code available for payment source

## 2012-05-19 ENCOUNTER — Other Ambulatory Visit: Payer: Self-pay | Admitting: Oncology

## 2012-05-19 ENCOUNTER — Telehealth: Payer: Self-pay | Admitting: *Deleted

## 2012-05-19 DIAGNOSIS — D729 Disorder of white blood cells, unspecified: Secondary | ICD-10-CM

## 2012-05-19 NOTE — Telephone Encounter (Signed)
Wife called pt missed appt for CT scan yesterday,  States they did not get the message that it was scheduled.  I instructed her to call Radiology Dept to r/s the scan and to call this nurse back if any problems.  She agreed.

## 2012-05-19 NOTE — Telephone Encounter (Signed)
Spoke w/ Austin Griffith and she informs of CT scheduled for tomorrow and asks if Dr. Gaylyn Rong can see pt tomorrow afternoon?  Informed pt needs labs one week prior to visit per Dr. Gaylyn Rong.  Instructed to come in for lab tomorrow prior to CT and keep office visit as scheduled for next Wednesday. She verbalized understanding.

## 2012-05-19 NOTE — Telephone Encounter (Signed)
Called patient left voice message to inform the patient of the new date and time of the scan appointment for 05-20-2012 arrival time 11:15am

## 2012-05-19 NOTE — Telephone Encounter (Signed)
Please inform pt that I've turned in a POF.  He needs lab 1 week and CT the day before seeing me within the next few weeks.  Thanks.

## 2012-05-19 NOTE — Telephone Encounter (Signed)
Pt's wife called inquiring when pt's CT scan is ordered. Pt has FU w/Dr Basilio Cairo tomorrow, and wife states "there is no use in him coming if the scan's not been done". Informed Ms Goodbar that pt was scheduled for scan on 05/18/12, was a 'no show' per Epic. She states they never got a call about the scan. Informed wife this RN will call Dr Lodema Pilot nurse to have scan rescheduled. Wife states she still wants to cancel FU tomorrow w/Dr Basilio Cairo. Spoke w/Cameo RN who states she will call pt to take care of having scan rescheduled. Notified R Flynt to cancel pt's FU; will reschedule after scan complete.

## 2012-05-20 ENCOUNTER — Ambulatory Visit (HOSPITAL_COMMUNITY)
Admission: RE | Admit: 2012-05-20 | Discharge: 2012-05-20 | Disposition: A | Discharge status: Home / Self Care | Payer: No Typology Code available for payment source | Source: Ambulatory Visit | Attending: Oncology | Admitting: Oncology

## 2012-05-20 ENCOUNTER — Telehealth: Payer: Self-pay | Admitting: *Deleted

## 2012-05-20 ENCOUNTER — Other Ambulatory Visit (HOSPITAL_BASED_OUTPATIENT_CLINIC_OR_DEPARTMENT_OTHER): Payer: No Typology Code available for payment source | Admitting: Lab

## 2012-05-20 ENCOUNTER — Ambulatory Visit
Admission: RE | Admit: 2012-05-20 | Payer: No Typology Code available for payment source | Source: Ambulatory Visit | Admitting: Radiation Oncology

## 2012-05-20 DIAGNOSIS — D729 Disorder of white blood cells, unspecified: Secondary | ICD-10-CM

## 2012-05-20 DIAGNOSIS — C9030 Solitary plasmacytoma not having achieved remission: Secondary | ICD-10-CM

## 2012-05-20 DIAGNOSIS — Z923 Personal history of irradiation: Secondary | ICD-10-CM | POA: Insufficient documentation

## 2012-05-20 DIAGNOSIS — D4709 Other mast cell neoplasms of uncertain behavior: Secondary | ICD-10-CM

## 2012-05-20 DIAGNOSIS — D7289 Other specified disorders of white blood cells: Secondary | ICD-10-CM

## 2012-05-20 DIAGNOSIS — I10 Essential (primary) hypertension: Secondary | ICD-10-CM

## 2012-05-20 LAB — CBC WITH DIFFERENTIAL/PLATELET
Eosinophils Absolute: 0 10*3/uL (ref 0.0–0.5)
HCT: 42.4 % (ref 38.4–49.9)
LYMPH%: 9.1 % — ABNORMAL LOW (ref 14.0–49.0)
MONO#: 0.2 10*3/uL (ref 0.1–0.9)
NEUT#: 5.7 10*3/uL (ref 1.5–6.5)
NEUT%: 86.8 % — ABNORMAL HIGH (ref 39.0–75.0)
Platelets: 131 10*3/uL — ABNORMAL LOW (ref 140–400)
WBC: 6.6 10*3/uL (ref 4.0–10.3)

## 2012-05-20 LAB — CMP (CANCER CENTER ONLY)
ALT(SGPT): 52 U/L — ABNORMAL HIGH (ref 10–47)
AST: 30 U/L (ref 11–38)
Alkaline Phosphatase: 58 U/L (ref 26–84)
Calcium: 9 mg/dL (ref 8.0–10.3)
Chloride: 100 mEq/L (ref 98–108)
Creat: 1 mg/dl (ref 0.6–1.2)
Total Bilirubin: 0.9 mg/dl (ref 0.20–1.60)

## 2012-05-20 MED ORDER — IOHEXOL 300 MG/ML  SOLN
100.0000 mL | Freq: Once | INTRAMUSCULAR | Status: AC | PRN
  Administered 2012-05-20: 100 mL via INTRAVENOUS

## 2012-05-20 MED ORDER — IOHEXOL 300 MG/ML  SOLN: 100.0000 mL | Freq: Once | INTRAMUSCULAR | Status: AC | PRN

## 2012-05-20 NOTE — Patient Instructions (Addendum)
A.  Result of CT 05/20/2012:  Interval marked response to therapy and regression of  abnormal soft tissue and opacification of the paranasal sinuses  centered at the right posterior ethmoids. The residual mild  opacification within the right posterior ethmoid now appears as low  CT density, favor the retained secretions rather than residual  tumor. Previously seen tumor in the medial right orbit is no longer  evident. The right medial rectus muscle appears within normal  limits. Grossly, no residual tumor traversing the right cribiform  plate. Olfactory recesses are normally pneumatized. No confluent  residual tumor mass is evident.   The sphenoid sinuses, and right ethmoids now are largely  pneumatized. Other paranasal sinuses are clear. Bone  mineralization throughout the region is within normal limits by CT.  Negative visualized brain parenchyma. Visualized major vascular  structures are enhancing. Negative visualized deep soft tissue  spaces of the face.  B.  Follow up:  Lab/RV and CT sinus in about 4 months.  In the future, if continue to do well and no increase in abnormal protein level, we'll space visis out to every 6 months.

## 2012-05-20 NOTE — Telephone Encounter (Signed)
Message copied by Wende Mott on Fri May 20, 2012  2:57 PM ------      Message from: Jethro Bolus T      Created: Fri May 20, 2012  1:42 PM       Please call pt.  His CT result was good.  I'll show them the images and lab when he comes in next week.  Thanks.

## 2012-05-20 NOTE — Telephone Encounter (Signed)
Called pt's wife and informed of CT results good per Dr. Gaylyn Rong.  He will review results of CT and Labs next week on office visit.  She verbalized understanding.

## 2012-05-24 ENCOUNTER — Other Ambulatory Visit: Payer: No Typology Code available for payment source | Admitting: Lab

## 2012-05-24 ENCOUNTER — Other Ambulatory Visit (HOSPITAL_COMMUNITY): Payer: No Typology Code available for payment source

## 2012-05-24 LAB — PROTEIN ELECTROPHORESIS, SERUM
Alpha-1-Globulin: 4.6 % (ref 2.9–4.9)
Alpha-2-Globulin: 9 % (ref 7.1–11.8)
Beta Globulin: 7.5 % — ABNORMAL HIGH (ref 4.7–7.2)
Gamma Globulin: 12.4 % (ref 11.1–18.8)
Total Protein, Serum Electrophoresis: 6.2 g/dL (ref 6.0–8.3)

## 2012-05-24 LAB — KAPPA/LAMBDA LIGHT CHAINS
Kappa free light chain: 1.38 mg/dL (ref 0.33–1.94)
Kappa:Lambda Ratio: 0.82 (ref 0.26–1.65)
Lambda Free Lght Chn: 1.69 mg/dL (ref 0.57–2.63)

## 2012-05-24 LAB — IGG, IGA, IGM: IgA: 357 mg/dL (ref 68–379)

## 2012-05-25 ENCOUNTER — Ambulatory Visit: Payer: No Typology Code available for payment source | Admitting: Radiation Oncology

## 2012-05-25 ENCOUNTER — Telehealth: Payer: Self-pay | Admitting: Oncology

## 2012-05-25 ENCOUNTER — Ambulatory Visit (HOSPITAL_BASED_OUTPATIENT_CLINIC_OR_DEPARTMENT_OTHER): Payer: No Typology Code available for payment source | Admitting: Oncology

## 2012-05-25 VITALS — BP 149/97 | HR 92 | Temp 97.8°F | Resp 22 | Ht 74.0 in | Wt 277.2 lb

## 2012-05-25 DIAGNOSIS — D696 Thrombocytopenia, unspecified: Secondary | ICD-10-CM

## 2012-05-25 DIAGNOSIS — R5383 Other fatigue: Secondary | ICD-10-CM

## 2012-05-25 DIAGNOSIS — C311 Malignant neoplasm of ethmoidal sinus: Secondary | ICD-10-CM

## 2012-05-25 DIAGNOSIS — D729 Disorder of white blood cells, unspecified: Secondary | ICD-10-CM

## 2012-05-25 HISTORY — DX: Thrombocytopenia, unspecified: D69.6

## 2012-05-25 HISTORY — DX: Other fatigue: R53.83

## 2012-05-25 NOTE — Progress Notes (Signed)
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   PAST THERAPY:  Radiation therapy 50.4 gray in 20 fractions by Dr. Basilio Cairo between 02/29/2012 and 04/08/2012.   CURRENT THERAPY:  Watchful observation.   INTERVAL HISTORY: Austin Griffith 51 y.o. male returns for regular follow up with his wife.  He reports feeling relatively well.  He still has some right eye discomfort in the medial epicanthus.  This discomfort is very mild; he does not need pain medication.  He underwent radiation to the plasmacytoma with some eye irritation, skin burning which have significantly improved.  He denied EOM problem. He has mild fatigue; but this is improving as well.  He is working full time.  He denied headache, seizure, diplopia, blurry vision, epistaxis.   Patient denies fever, anorexia, weight loss, fatigue, confusion, drenching night sweats, palpable lymph node swelling, mucositis, odynophagia, dysphagia, nausea vomiting, jaundice, chest pain, palpitation, shortness of breath, dyspnea on exertion, productive cough, gum bleeding, epistaxis, hematemesis, hemoptysis, abdominal pain, abdominal swelling, early satiety, melena, hematochezia, hematuria, skin rash, spontaneous bleeding, joint swelling, joint pain, heat or cold intolerance, bowel bladder incontinence, back pain, focal motor weakness, paresthesia, depression.    Past Medical History  Diagnosis Date  . Hypertension   . Plasmacytoma      Right Ethmoid Sinus  . Hyperlipidemia     diet control   . AVN (avascular necrosis of bone)     bilateral hips; s/p hip replacement in 1991 and 2009  . Headache 02/08/2012    Right Sided Headache Behind Right Eye  . Epistaxis 02/08/2012  . Blurred vision     Present for "many years"  . Fatigue   . Acid reflux     Past Surgical History  Procedure Date  .  Bilateral hip replacement 1991; and 2009    due to bilateral hip AVN's.   . Mass biopsy 01/26/12    Right Ethmoid Mass - Plasma Cell Neoplasm    Current Outpatient Prescriptions  Medication Sig Dispense Refill  . escitalopram (LEXAPRO) 20 MG tablet Take 10 mg by mouth daily.      . lansoprazole (PREVACID) 15 MG capsule Take 15 mg by mouth daily.      Marland Kitchen LORazepam (ATIVAN) 1 MG tablet Take 1 mg by mouth every 8 (eight) hours as needed. For anxiety      . olmesartan (BENICAR) 20 MG tablet Take 10 mg by mouth daily.      Marland Kitchen HYDROcodone-acetaminophen (NORCO) 5-325 MG per tablet Take 1 tablet by mouth every 6 (six) hours as needed.  45 tablet  0  . promethazine (PHENERGAN) 25 MG tablet Take 1 tablet (25 mg total) by mouth every 6 (six) hours as needed for nausea.  30 tablet  5    ALLERGIES:   has no known allergies.  REVIEW OF SYSTEMS:  The rest of the 14-point review of system was negative.   Filed Vitals:   05/25/12 0936  BP: 149/97  Pulse: 92  Temp: 97.8 F (36.6 C)  Resp: 22   Wt Readings from Last 3 Encounters:  05/25/12 277 lb 3.2 oz (125.737 kg)  04/08/12 271 lb 4.8 oz (123.061 kg)  04/04/12 270 lb 12.8 oz (122.834 kg)   ECOG Performance status: 0  PHYSICAL EXAMINATION:   General:  well-nourished man, in no acute distress.  Eyes:  no scleral  icterus.  ENT:  There were no oropharyngeal lesions.  Neck was without thyromegaly.  There was no sinus tenderness on percussion.   Lymphatics:  Negative cervical, supraclavicular or axillary adenopathy.  Respiratory: lungs were clear bilaterally without wheezing or crackles.  Cardiovascular:  Regular rate and rhythm, S1/S2, without murmur, rub or gallop.  There was no pedal edema.  GI:  abdomen was soft, flat, nontender, nondistended, without organomegaly.  Muscoloskeletal:  no spinal tenderness of palpation of vertebral spine.  Skin exam was without echymosis, petichae.  Neuro exam was nonfocal.  CN II-XII grossly intact.  His extra occular  movement was intact.  There was no nystagmus.  Patient was able to get on and off exam table without assistance.  Gait was normal.  Patient was alerted and oriented.  Attention was good.   Language was appropriate.  Mood was normal without depression.  Speech was not pressured.  Thought content was not tangential.    LABORATORY/RADIOLOGY DATA:  Lab Results  Component Value Date   WBC 6.6 05/20/2012   HGB 14.8 05/20/2012   HCT 42.4 05/20/2012   PLT 131* 05/20/2012   GLUCOSE 110 05/20/2012   ALKPHOS 58 05/20/2012   ALT 83* 02/08/2012   AST 30 05/20/2012   NA 138 05/20/2012   K 4.5 05/20/2012   CL 100 05/20/2012   CREATININE 1.0 05/20/2012   BUN 15 05/20/2012   CO2 27 05/20/2012    IMAGING:  I personally reviewed the following CT and showed the images to the patient and his wife.   Ct Maxillofacial W/cm  05/20/2012  *RADIOLOGY REPORT*  Clinical Data: 51 year old male with ethmoid sinus plasmacytoma status post radiation therapy.  CT MAXILLOFACIAL WITH CONTRAST  Technique:  Multidetector CT imaging of the maxillofacial structures was performed with intravenous contrast. Multiplanar CT image reconstructions were also generated.  Contrast: OMNIPAQUE IOHEXOL 300 MG/ML  SOLN  Comparison: Pretreatment Brain MRI 02/18/2012 and face CT 01/22/2012.  Findings: Interval marked response to therapy and regression of abnormal soft tissue and opacification of the paranasal sinuses centered at the right posterior ethmoids.  The residual mild opacification within the right posterior ethmoid now appears as low CT density, favor the retained secretions rather than residual tumor. Previously seen tumor in the medial right orbit is no longer evident.  The right medial rectus muscle appears within normal limits.  Grossly, no residual tumor traversing the right cribiform plate.  Olfactory recesses are normally pneumatized.  No confluent residual tumor mass is evident.  The sphenoid sinuses, and right ethmoids now are largely pneumatized.   Other paranasal sinuses are clear.  Bone mineralization throughout the region is within normal limits by CT.  Negative visualized brain parenchyma.  Visualized major vascular structures are enhancing.  Negative visualized deep soft tissue spaces of the face.  IMPRESSION: 1.  Apparent complete response to therapy in by CT, no convincing residual tumor. Repeat brain MRI evaluation (using the same protocol as on 02/28/2012) would be more sensitive (particularly with regard to residual disease in the clivus and anterior cranial fossa). 2.  No new abnormality.  Original Report Authenticated By: Harley Hallmark, M.D.    ASSESSMENT AND PLAN:   1. EtOH: he reported no more EtOH.   2. Elevated LFT:  Resolved with stopping EtOH.   3. Thrombocytopenia:  - most likely due to [facility]. Bone marrow was negative. It is stable.   4. Solitary right ethmoid sinus IgA kappa plasmacytoma:  - s/p radiation with complete radiographic response  on follow up CT scan.  I personally contacted Dr. Tona Sensing office and spoke with his nurse asking for a follow up appointment for patient to see if direct visualization confirms resolution of disease and potential biopsy if there remains abnormality.  - Prognosis:  Is relatively good given no M-spike; and normalization of his IgA and serum free kappa light chain level.   - Follow up:  CT sinus in about 4 months; and repeat myeloma panel.

## 2012-05-25 NOTE — Telephone Encounter (Signed)
gve the pt his dec 2013 appt calendar along with the ct scan appt °

## 2012-05-31 ENCOUNTER — Ambulatory Visit (HOSPITAL_COMMUNITY)
Admission: RE | Admit: 2012-05-31 | Discharge: 2012-05-31 | Disposition: A | Discharge status: Home / Self Care | Payer: No Typology Code available for payment source | Source: Ambulatory Visit | Attending: Family Medicine | Admitting: Family Medicine

## 2012-05-31 ENCOUNTER — Other Ambulatory Visit (HOSPITAL_COMMUNITY): Payer: Self-pay | Admitting: Family Medicine

## 2012-05-31 DIAGNOSIS — R0602 Shortness of breath: Secondary | ICD-10-CM | POA: Insufficient documentation

## 2012-06-06 ENCOUNTER — Telehealth: Payer: Self-pay | Admitting: *Deleted

## 2012-06-06 NOTE — Telephone Encounter (Signed)
Mrs. Fauble called stating that since Mr Copes completed his steroids on the week of 05/25/12( can't remember the exact date) he has experienced headache, increased fatigue, SOB, nausea with vomiting, lack of appetite, and strange taste.  He was since at [facility] last week and all of his test; EKG, CXR and lab work were all "normal".  He restarted his Zofran with relief as relayed by Mrs.  Jimmye Norman, but the other history is very concerning to her and she is requesting that Mr. Anello be seen.  Will send a message to Dr. Basilio Cairo to determine when he can be seen within the next day.  Encouraged wife to call if condition worsens or to take him to the ED if she is greatly concerned.  Informed Dr. Roselind Messier and Dr. Dayton Scrape of above conversation and patients' status. May need to be seen by Dr. Roselind Messier on Wed. unless situation considered emergent.

## 2012-06-07 ENCOUNTER — Encounter: Payer: Self-pay | Admitting: *Deleted

## 2012-06-07 NOTE — Progress Notes (Signed)
Austin Griffith called Austin Griffith wife at 11:30am to inform her that Dr. Basilio Griffith would be able to see him on tomorrow at 1530.  This RN was witness to this interaction. Then this RN called to inform her to please obtain the physician notes, labs and xray reports performed at [facility] within the past week and she confirmed that she could obtain the information.  At 3:30pm Austin [facility] mistakenly thinking the appointment was for today.  Offered for him to see Dr. Michell Griffith today but he refused stating he only wanted to see Dr. Basilio Griffith.  Informed that it could be arranged for Dr. Michell Griffith to see him today but he refused again.  Disposition to home with wife.

## 2012-06-08 ENCOUNTER — Other Ambulatory Visit: Payer: Self-pay | Admitting: Radiation Oncology

## 2012-06-08 ENCOUNTER — Ambulatory Visit
Admission: RE | Admit: 2012-06-08 | Discharge: 2012-06-08 | Disposition: A | Discharge status: Home / Self Care | Payer: No Typology Code available for payment source | Source: Ambulatory Visit | Attending: Radiation Oncology | Admitting: Radiation Oncology

## 2012-06-08 ENCOUNTER — Telehealth: Payer: Self-pay | Admitting: *Deleted

## 2012-06-08 ENCOUNTER — Encounter: Payer: Self-pay | Admitting: Radiation Oncology

## 2012-06-08 VITALS — BP 75/54 | HR 120 | Temp 97.5°F | Resp 20 | Wt 264.2 lb

## 2012-06-08 DIAGNOSIS — B370 Candidal stomatitis: Secondary | ICD-10-CM

## 2012-06-08 DIAGNOSIS — C311 Malignant neoplasm of ethmoidal sinus: Secondary | ICD-10-CM

## 2012-06-08 HISTORY — DX: Personal history of irradiation: Z92.3

## 2012-06-08 MED ORDER — FLUCONAZOLE 100 MG PO TABS: 100.0000 mg | ORAL_TABLET | ORAL | Status: AC

## 2012-06-08 MED ORDER — DEXAMETHASONE 4 MG PO TABS: ORAL_TABLET | ORAL | Status: DC

## 2012-06-08 NOTE — Telephone Encounter (Signed)
Per Dr.Squire,called infusion ,spoke to Austin Griffith,  Patient to get 1 liter Normal saline IVF's, patient to be here tomorrow for 4pm appt in med onc infusion, thanked Alcario Drought, and [place] the scheduler,informed patient and wife 4:24 PM

## 2012-06-08 NOTE — Progress Notes (Signed)
Radiation Oncology         [phone]) [phone] ________________________________  Name: Austin Griffith MRN: [id number]  Date: 06/08/2012  DOB: 01-Oct-1961  Follow-Up Visit Note  CC: Colette Ribas, MD  Colette Ribas, MD  Diagnosis:   Plasmacytoma of the right ethmoid sinus   Interval Since Last Radiation:  He completed 50.4 gray in 28 fractions on 04/08/2012  Narrative:  The patient returns today for routine follow-up.   he canceled his followup 2 weeks ago and was feeling fine at that time. The patient was status post a followup CT scan of his sinuses which showed complete response to radiotherapy. The patient reports that after he completed radiotherapy he decided to start a Decadron taper that I had prescribed. He continued this for 4 weeks as prescribed. He is wife report that he "loved being on Decadron ." He felt quite well during the taper. After completing the Decadron taper, he started to feel poorly. He said that he became very exhausted and also short of breath with minimal exertion. His appetite became very poor while it had been excellent while he was on the Decadron. He also reports that he has been almost passing out when  standing upright.   he does have some nausea for which she takes ondansetron. He denies any vomiting or diarrhea. He lost about 10 pounds over the past couple weeks. His wife reports poor by mouth intake.   The patient did see his primary Dr. about a week ago. A chest x-ray was performed which was negative. Since the patient was not having any chest pain or other concerning symptoms for pulmonary embolism, CT scan was not performed. On 05/31/2012 he underwent a CMP    which was normal with exception to a creatinine of 1.38. CBC was unremarkable. Repeat labs on 06/02/2012 showed an entirely normal CMP and his CBC was close to normal with exception to a white count of 3.9 and platelets of 147.  The patient reports that his most bothersome symptom is that he is  exhausted and sleeping all the time. Next most bothersome symptom is that he almost passes out when he stands upright. His third most bothersome symptom is poor appetite and poor sense of taste. His wife has doubled up his dose of Lexapro due to the patient's anxiety and lack of motivation.                      ALLERGIES:   has no known allergies.  Meds: Current Outpatient Prescriptions  Medication Sig Dispense Refill  . escitalopram (LEXAPRO) 20 MG tablet Take 10 mg by mouth daily.      Marland Kitchen HYDROcodone-acetaminophen (NORCO) 5-325 MG per tablet Take 1 tablet by mouth every 6 (six) hours as needed.  45 tablet  0  . lansoprazole (PREVACID) 15 MG capsule Take 15 mg by mouth daily.      Marland Kitchen LORazepam (ATIVAN) 1 MG tablet Take 1 mg by mouth every 8 (eight) hours as needed. For anxiety      . olmesartan (BENICAR) 20 MG tablet Take 10 mg by mouth daily.      Marland Kitchen dexamethasone (DECADRON) 4 MG tablet Take 4 mg daily x 2 weeks, then 2 mg (1/2 pill) daily x 2 weeks, then 2 mg every other day x 2 weeks, then stop  28 tablet  0  . fluconazole (DIFLUCAN) 100 MG tablet Take 1 tablet (100 mg total) by mouth as directed. Take 2 tab today, then  1 tab daily x 3 weeks  22 tablet  0  . promethazine (PHENERGAN) 25 MG tablet Take 1 tablet (25 mg total) by mouth every 6 (six) hours as needed for nausea.  30 tablet  5    Physical Findings: The patient is in no acute distress. Patient is alert and oriented.  weight is 264 lb 3.2 oz (119.84 kg). His oral temperature is 97.5 F (36.4 C). His blood pressure is 75/54 and his pulse is 120. His respiration is 20 and oxygen saturation is 92%. .  No significant changes. extraocular movements are intact.  Visual fields are intact in all quadrants bilaterally.  Oropharynx demonstrates mild thrush of the patient's tongue.  Neck is supple without palpable lymphadenopathy. Heart regular in rate and rhythm no murmurs rubs or gallops.  chest clear to auscultation bilaterally without  rhonchi wheezes or rales. abdomen is soft nontender nondistended with normoactive bowel sounds extremities reveal no clubbing cyanosis or edema.   Lab Findings: As above   Radiographic Findings: Dg Chest 2 View  05/31/2012  *RADIOLOGY REPORT*  Clinical Data: Shortness of breath.  CHEST - 2 VIEW  Comparison: None.  Findings: The lungs are clear.  The heart and mediastinal structures are normal.  The osseous structures are unremarkable.  IMPRESSION: No evidence for active chest disease.   Original Report Authenticated By: Rolla Plate, M.D.    Ct Maxillofacial W/cm  05/20/2012  *RADIOLOGY REPORT*  Clinical Data: 51 year old male with ethmoid sinus plasmacytoma status post radiation therapy.  CT MAXILLOFACIAL WITH CONTRAST  Technique:  Multidetector CT imaging of the maxillofacial structures was performed with intravenous contrast. Multiplanar CT image reconstructions were also generated.  Contrast: OMNIPAQUE IOHEXOL 300 MG/ML  SOLN  Comparison: Pretreatment Brain MRI 02/18/2012 and face CT 01/22/2012.  Findings: Interval marked response to therapy and regression of abnormal soft tissue and opacification of the paranasal sinuses centered at the right posterior ethmoids.  The residual mild opacification within the right posterior ethmoid now appears as low CT density, favor the retained secretions rather than residual tumor. Previously seen tumor in the medial right orbit is no longer evident.  The right medial rectus muscle appears within normal limits.  Grossly, no residual tumor traversing the right cribiform plate.  Olfactory recesses are normally pneumatized.  No confluent residual tumor mass is evident.  The sphenoid sinuses, and right ethmoids now are largely pneumatized.  Other paranasal sinuses are clear.  Bone mineralization throughout the region is within normal limits by CT.  Negative visualized brain parenchyma.  Visualized major vascular structures are enhancing.  Negative visualized  deep soft tissue spaces of the face.  IMPRESSION: 1.  Apparent complete response to therapy in by CT, no convincing residual tumor. Repeat brain MRI evaluation (using the same protocol as on 02/28/2012) would be more sensitive (particularly with regard to residual disease in the clivus and anterior cranial fossa). 2.  No new abnormality.  Original Report Authenticated By: Harley Hallmark, M.D.    Impression:  The patient is recovering from the effects of radiation.   per symptoms above, he may have early adrenal insufficiency after completing Decadron   Plan:   will start in a modest and slow Decadron taper. We'll continue this for 6 weeks.   For his thrush have also given him a prescription for fluconazole.  Followup in 6 weeks.  Patient encouraged to call if issues before then. Encouraged to push oral intake of fluids and to call us if he has difficulty  with this ; we can certainly give him IV fluids if needed . _____________________________________   Lonie Peak, MD

## 2012-06-08 NOTE — Progress Notes (Signed)
Patient here f/u s/p rad tx right ethmoid sinus, rad txs 02/29/12-04/08/12,.  Patient alert,oriented x3, pain in his eyes, weak, fatigued, blurred vision,  Taking his nausea med 3 days ago, light headed when standing from sitting posistion, looks like start of thrush on patients tongue, siting vitals=t=97.5, b/p=91/60,p=88,rr=20 Standing b/p=75/54,p=121,rr=20, no taste, no difficulty swallowing, por appetite, las los over 10 lbs, since 05/20/12 when seen by Dr.Ha,states"I can sleep 2-0 hours a day",

## 2012-06-08 NOTE — Telephone Encounter (Signed)
Per Dr. Basilio Cairo after speaking with Dr.Ha, to cancel his IVf's  Tomorrow, left message on michelle, scheduler's phone  Voice message 4:27 PM

## 2012-06-09 ENCOUNTER — Ambulatory Visit: Payer: No Typology Code available for payment source

## 2012-06-15 ENCOUNTER — Encounter: Payer: Self-pay | Admitting: Dietician

## 2012-06-15 NOTE — Progress Notes (Signed)
Brief Out-patient Oncology Nutrition Note  Reason: Patient Screened Positive For Nutrition Risk For Unintentional Weight Loss and Decreased Appetite.   Austin Griffith is a 51 year old male patient of Dr. Basilio Cairo, diagnosed with ,alignant neoplasm of ethmoidal sinus. Attempted to contact patient via telephone for positive nutrition risk. Left patient voicemail with RD contact information.   Wt Readings from Last 10 Encounters:  06/08/12 264 lb 3.2 oz (119.84 kg)  05/25/12 277 lb 3.2 oz (125.737 kg)  04/08/12 271 lb 4.8 oz (123.061 kg)  04/04/12 270 lb 12.8 oz (122.834 kg)  03/29/12 274 lb (124.286 kg)  03/21/12 276 lb 9.6 oz (125.465 kg)  03/14/12 275 lb 4.8 oz (124.875 kg)  03/09/12 275 lb 12.8 oz (125.102 kg)  02/29/12 279 lb 1.6 oz (126.599 kg)  02/12/12 283 lb 14.4 oz (128.776 kg)     RD available for nutrition needs.   Iven Finn, MS, RD, LDN [phone]

## 2012-07-21 ENCOUNTER — Encounter: Payer: Self-pay | Admitting: Radiation Oncology

## 2012-07-22 ENCOUNTER — Encounter: Payer: Self-pay | Admitting: Radiation Oncology

## 2012-07-22 ENCOUNTER — Ambulatory Visit
Admission: RE | Admit: 2012-07-22 | Discharge: 2012-07-22 | Disposition: A | Discharge status: Home / Self Care | Payer: No Typology Code available for payment source | Source: Ambulatory Visit | Attending: Radiation Oncology | Admitting: Radiation Oncology

## 2012-07-22 ENCOUNTER — Other Ambulatory Visit: Payer: Self-pay | Admitting: Oncology

## 2012-07-22 ENCOUNTER — Ambulatory Visit (HOSPITAL_BASED_OUTPATIENT_CLINIC_OR_DEPARTMENT_OTHER): Payer: No Typology Code available for payment source | Admitting: Lab

## 2012-07-22 VITALS — BP 112/81 | HR 84 | Temp 97.5°F | Wt 273.5 lb

## 2012-07-22 DIAGNOSIS — D7289 Other specified disorders of white blood cells: Secondary | ICD-10-CM

## 2012-07-22 DIAGNOSIS — E2740 Unspecified adrenocortical insufficiency: Secondary | ICD-10-CM | POA: Insufficient documentation

## 2012-07-22 DIAGNOSIS — D729 Disorder of white blood cells, unspecified: Secondary | ICD-10-CM

## 2012-07-22 DIAGNOSIS — C311 Malignant neoplasm of ethmoidal sinus: Secondary | ICD-10-CM

## 2012-07-22 HISTORY — DX: Thrombocytopenia, unspecified: D69.6

## 2012-07-22 LAB — CBC WITH DIFFERENTIAL/PLATELET
BASO%: 0.3 % (ref 0.0–2.0)
Basophils Absolute: 0 10*3/uL (ref 0.0–0.1)
EOS%: 0.6 % (ref 0.0–7.0)
HCT: 41.4 % (ref 38.4–49.9)
HGB: 14.7 g/dL (ref 13.0–17.1)
LYMPH%: 15.4 % (ref 14.0–49.0)
MCH: 33.4 pg (ref 27.2–33.4)
MCHC: 35.5 g/dL (ref 32.0–36.0)
MCV: 94.2 fL (ref 79.3–98.0)
MONO%: 7.1 % (ref 0.0–14.0)
NEUT%: 76.6 % — ABNORMAL HIGH (ref 39.0–75.0)
Platelets: 170 10*3/uL (ref 140–400)

## 2012-07-22 LAB — COMPREHENSIVE METABOLIC PANEL
Albumin: 3.9 g/dL (ref 3.5–5.2)
Alkaline Phosphatase: 47 U/L (ref 39–117)
BUN: 13 mg/dL (ref 6–23)
CO2: 25 mEq/L (ref 19–32)
Glucose, Bld: 89 mg/dL (ref 70–99)
Sodium: 143 mEq/L (ref 135–145)
Total Bilirubin: 1 mg/dL (ref 0.3–1.2)
Total Protein: 6.1 g/dL (ref 6.0–8.3)

## 2012-07-22 MED ORDER — DEXAMETHASONE 4 MG PO TABS: ORAL_TABLET | ORAL | Status: DC

## 2012-07-22 NOTE — Progress Notes (Signed)
Radiation Oncology         [phone]) [phone] ________________________________  Name: Austin Griffith MRN: [id number]  Date: 07/22/2012  DOB: [birth year]  Follow-Up Visit Note  Diagnosis:  Plasmacytoma of the right ethmoid sinus  Interval Since Last Radiation:  He completed 50.4 gray in 28 fractions on 04/08/2012  Narrative:  The patient returns today for routine follow-up. Over the past 7 days he has not been feeling well. He just completed his 6 week Decadron taper. His symptoms now are similar to how they were 6 weeks ago when I saw him after his first Decadron taper.  At that time, I felt that he had symptoms consistent with adrenal insufficiency. He once again has dizziness, weakness, fatigue, nausea, but he is hydrating well.   He has a low-grade headache. He does report watery eyes and he reports he reports change in taste.                    ALLERGIES:   has no known allergies.  Meds: Current Outpatient Prescriptions  Medication Sig Dispense Refill  . dexamethasone (DECADRON) 4 MG tablet Take 4 mg daily x 2 weeks, then 2 mg (1/2 pill) daily until you meet with an endocrinologist for further management.  30 tablet  1  . escitalopram (LEXAPRO) 20 MG tablet Take 10 mg by mouth daily.      Marland Kitchen HYDROcodone-acetaminophen (NORCO) 5-325 MG per tablet Take 1 tablet by mouth every 6 (six) hours as needed.  45 tablet  0  . lansoprazole (PREVACID) 15 MG capsule Take 15 mg by mouth daily.      Marland Kitchen loratadine (CLARITIN) 10 MG tablet Take 10 mg by mouth daily.      Marland Kitchen LORazepam (ATIVAN) 1 MG tablet Take 1 mg by mouth every 8 (eight) hours as needed. For anxiety      . olmesartan (BENICAR) 20 MG tablet Take 10 mg by mouth daily.      . promethazine (PHENERGAN) 25 MG tablet Take 1 tablet (25 mg total) by mouth every 6 (six) hours as needed for nausea.  30 tablet  5    Physical Findings: The patient is in no acute distress. Patient is alert and oriented.  weight is 273 lb 8 oz (124.059 kg). His  temperature is 97.5 F (36.4 C). His blood pressure is 112/81 and his pulse is 84. Marland Kitchen  His weight on 06/08/2012 was 264 pounds; however, his current weight is more consistent with his baseline. He is slightly cushingoid in his face. He has no oropharyngeal thrush. Pupils are equally round and reactive to light. Extraocular movements are intact. He reports that his vision is blurry but it is grossly intact in all quadrants.  Heart regular in rate and rhythm. Chest clear to auscultation bilaterally. Abdomen is soft and nontender with normoactive bowel sounds. No clubbing cyanosis or edema in his extremities. He is able to ambulate independently.   Lab Findings: Lab Results  Component Value Date   WBC 6.6 05/20/2012   HGB 14.8 05/20/2012   HCT 42.4 05/20/2012   MCV 92.7 05/20/2012   PLT 131* 05/20/2012    CMP     Component Value Date/Time   NA 138 05/20/2012 1105   NA 136 02/08/2012 1447   K 4.5 05/20/2012 1105   K 4.2 02/08/2012 1447   CL 100 05/20/2012 1105   CL 102 02/08/2012 1447   CO2 27 05/20/2012 1105   CO2 27 02/08/2012 1447   GLUCOSE 110  05/20/2012 1105   GLUCOSE 117* 02/08/2012 1447   BUN 15 05/20/2012 1105   BUN 15 02/08/2012 1447   CREATININE 1.0 05/20/2012 1105   CREATININE 0.99 02/08/2012 1447   CALCIUM 9.0 05/20/2012 1105   CALCIUM 8.7 02/08/2012 1447   PROT 6.8 05/20/2012 1105   PROT 6.8 02/08/2012 1447   ALBUMIN 3.8 02/08/2012 1447   AST 30 05/20/2012 1105   AST 40* 02/08/2012 1447   ALT 83* 02/08/2012 1447   ALKPHOS 58 05/20/2012 1105   ALKPHOS 71 02/08/2012 1447   BILITOT 0.90 05/20/2012 1105   BILITOT 0.5 02/08/2012 1447      Radiographic Findings: No results found.  Impression:  He continues to show symptoms that seem consistent with adrenal insufficiency. This may be secondary to dexamethasone use, which had been initiated during radiotherapy to decrease headaches and blurry vision related to his tumor. I would like an endocrinologist to see the patient to determine if this is the cause of his  symptoms. I would also like endocrinology to verify if he has any other hormonal imbalances following radiotherapy to the ethmoid sinuses which may have affected the pituitary gland function.  Plan:   1) Dr. Gaylyn Rong is ordering labwork for the patient today; I will call the wife's cell phone at [phone] with these results 2) I will order a consultation with an endocrinologist for further management. 3) I am prescribing 4 mg daily of Decadron x2 weeks and then 2 mg daily until an endocrinologist manages this further 4) MRI of the brain / sinuses will be ordered for the near future to rule out tumor progression/recurrence 5) I will order followup to be performed here sometime after his endocrinology consultation. 6) use artificial tears to address watery eyes.  Patient was encouraged to call if  any issues or questions in the interim. _____________________________________   Lonie Peak, MD

## 2012-07-22 NOTE — Progress Notes (Signed)
FU today post radiation therapy for tratemt to ethmoid sinuses.  States he had to stop three times while driving here today because of nausea and vomiting.  Wife reports he almost passed out in the shower this morning.  He also states his taste has changed. Headache behind eyes - Grades as a level 3.  He is presently tapering his Decadron dosage for the 2nd time and as before the dysguesia, headaches and nausea and vomiting have recurred.  He states he can sleep 18 hours without waking up.

## 2012-07-26 LAB — PROTEIN ELECTROPHORESIS, SERUM
Beta 2: 7.5 % — ABNORMAL HIGH (ref 3.2–6.5)
Gamma Globulin: 14.3 % (ref 11.1–18.8)

## 2012-07-26 LAB — KAPPA/LAMBDA LIGHT CHAINS: Kappa free light chain: 2.9 mg/dL — ABNORMAL HIGH (ref 0.33–1.94)

## 2012-07-29 ENCOUNTER — Ambulatory Visit
Admission: RE | Admit: 2012-07-29 | Discharge: 2012-07-29 | Disposition: A | Discharge status: Home / Self Care | Payer: No Typology Code available for payment source | Source: Ambulatory Visit | Attending: Radiation Oncology | Admitting: Radiation Oncology

## 2012-07-29 DIAGNOSIS — C311 Malignant neoplasm of ethmoidal sinus: Secondary | ICD-10-CM

## 2012-07-29 MED ORDER — GADOBENATE DIMEGLUMINE 529 MG/ML IV SOLN
20.0000 mL | Freq: Once | INTRAVENOUS | Status: AC | PRN
  Administered 2012-07-29: 20 mL via INTRAVENOUS

## 2012-08-02 ENCOUNTER — Telehealth (HOSPITAL_COMMUNITY): Payer: Self-pay | Admitting: Oncology

## 2012-08-30 NOTE — Progress Notes (Signed)
Received office notes from Dr. Reather Littler @ [facility]; forwarded to Dr. Gaylyn Rong.

## 2012-09-23 ENCOUNTER — Other Ambulatory Visit (HOSPITAL_BASED_OUTPATIENT_CLINIC_OR_DEPARTMENT_OTHER): Payer: No Typology Code available for payment source | Admitting: Lab

## 2012-09-23 ENCOUNTER — Other Ambulatory Visit (HOSPITAL_COMMUNITY): Payer: No Typology Code available for payment source

## 2012-09-23 DIAGNOSIS — C311 Malignant neoplasm of ethmoidal sinus: Secondary | ICD-10-CM

## 2012-09-23 DIAGNOSIS — D729 Disorder of white blood cells, unspecified: Secondary | ICD-10-CM

## 2012-09-23 LAB — CBC WITH DIFFERENTIAL/PLATELET
Basophils Absolute: 0 10*3/uL (ref 0.0–0.1)
Eosinophils Absolute: 0.1 10*3/uL (ref 0.0–0.5)
HGB: 14.4 g/dL (ref 13.0–17.1)
MCV: 96.3 fL (ref 79.3–98.0)
MONO#: 0.4 10*3/uL (ref 0.1–0.9)
MONO%: 7.2 % (ref 0.0–14.0)
NEUT#: 4.5 10*3/uL (ref 1.5–6.5)
RDW: 13.1 % (ref 11.0–14.6)
WBC: 6 10*3/uL (ref 4.0–10.3)

## 2012-09-23 LAB — COMPREHENSIVE METABOLIC PANEL (CC13)
ALT: 64 U/L — ABNORMAL HIGH (ref 0–55)
AST: 33 U/L (ref 5–34)
Albumin: 3.7 g/dL (ref 3.5–5.0)
CO2: 25 mEq/L (ref 22–29)
Calcium: 9.2 mg/dL (ref 8.4–10.4)
Chloride: 107 mEq/L (ref 98–107)
Potassium: 3.8 mEq/L (ref 3.5–5.1)
Sodium: 140 mEq/L (ref 136–145)
Total Protein: 6.7 g/dL (ref 6.4–8.3)

## 2012-09-27 ENCOUNTER — Ambulatory Visit (HOSPITAL_COMMUNITY)
Admission: RE | Admit: 2012-09-27 | Discharge: 2012-09-27 | Disposition: A | Discharge status: Home / Self Care | Payer: No Typology Code available for payment source | Source: Ambulatory Visit | Attending: Oncology | Admitting: Oncology

## 2012-09-27 DIAGNOSIS — D729 Disorder of white blood cells, unspecified: Secondary | ICD-10-CM

## 2012-09-27 DIAGNOSIS — J3489 Other specified disorders of nose and nasal sinuses: Secondary | ICD-10-CM | POA: Insufficient documentation

## 2012-09-27 DIAGNOSIS — C311 Malignant neoplasm of ethmoidal sinus: Secondary | ICD-10-CM

## 2012-09-27 DIAGNOSIS — C9030 Solitary plasmacytoma not having achieved remission: Secondary | ICD-10-CM | POA: Insufficient documentation

## 2012-09-27 LAB — PROTEIN ELECTROPHORESIS, SERUM
Albumin ELP: 62.2 % (ref 55.8–66.1)
Alpha-1-Globulin: 4.8 % (ref 2.9–4.9)
Alpha-2-Globulin: 8 % (ref 7.1–11.8)
Beta 2: 6 % (ref 3.2–6.5)
Beta Globulin: 7.8 % — ABNORMAL HIGH (ref 4.7–7.2)
Total Protein, Serum Electrophoresis: 5.8 g/dL — ABNORMAL LOW (ref 6.0–8.3)

## 2012-09-27 LAB — KAPPA/LAMBDA LIGHT CHAINS
Kappa:Lambda Ratio: 2.03 — ABNORMAL HIGH (ref 0.26–1.65)
Lambda Free Lght Chn: 1.45 mg/dL (ref 0.57–2.63)

## 2012-09-27 MED ORDER — IOHEXOL 300 MG/ML  SOLN
100.0000 mL | Freq: Once | INTRAMUSCULAR | Status: AC | PRN
  Administered 2012-09-27: 100 mL via INTRAVENOUS

## 2012-09-28 NOTE — Patient Instructions (Addendum)
1.  History of plasmacytoma.  2.  Treatment: radiation. 3.  Current status:  Improved on CT scan and blood test.  4.  Follow up:  With [place] in 6 months with lab test the week prior.

## 2012-09-29 ENCOUNTER — Ambulatory Visit (HOSPITAL_BASED_OUTPATIENT_CLINIC_OR_DEPARTMENT_OTHER): Payer: No Typology Code available for payment source | Admitting: Oncology

## 2012-09-29 ENCOUNTER — Telehealth: Payer: Self-pay | Admitting: Oncology

## 2012-09-29 VITALS — BP 115/79 | HR 109 | Temp 97.8°F | Resp 18 | Ht 74.0 in | Wt 286.8 lb

## 2012-09-29 DIAGNOSIS — E291 Testicular hypofunction: Secondary | ICD-10-CM

## 2012-09-29 DIAGNOSIS — D729 Disorder of white blood cells, unspecified: Secondary | ICD-10-CM

## 2012-09-29 DIAGNOSIS — E2749 Other adrenocortical insufficiency: Secondary | ICD-10-CM

## 2012-09-29 DIAGNOSIS — I10 Essential (primary) hypertension: Secondary | ICD-10-CM

## 2012-09-29 DIAGNOSIS — C888 Other malignant immunoproliferative diseases: Secondary | ICD-10-CM

## 2012-09-29 NOTE — Progress Notes (Signed)
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   PAST THERAPY:  Radiation therapy 50.4 gray in 20 fractions by Dr. Basilio Cairo between 02/29/2012 and 04/08/2012.   CURRENT THERAPY:  Watchful observation.   INTERVAL HISTORY: Austin Griffith 51 y.o. male returns for regular follow up with his wife and son.  He reported feeling fatigue which has improved compared to last year.  He has been on testosterone.  He has felt less dizzy compared to before.  He has been working full time from home.  He needs 1-2 nap(s) daily.  He has good appetite and no weight loss.  He no longer has any problem with reading or diplopia compared to at diagnosis.  The rest of the 14 point reviews of system was negative.    Past Medical History  Diagnosis Date  . Hypertension   . Plasmacytoma      Right Ethmoid Sinus  . Hyperlipidemia     diet control   . AVN (avascular necrosis of bone)     bilateral hips; s/p hip replacement in 1991 and 2009  . Headache 02/08/2012    Right Sided Headache Behind Right Eye  . Epistaxis 02/08/2012  . Blurred vision     Present for "many years"  . Fatigue   . Acid reflux   . History of radiation therapy 02/29/12-04/08/12    right  ethmoid sinus in 50.4Gy in 20 fxs  . Fatigue 05/25/12    "Mild"  . Thrombocytopenia 05/25/12    Past Surgical History  Procedure Date  . Bilateral hip replacement 1991; and 2009    due to bilateral hip AVN's.   . Mass biopsy 01/26/12    Right Ethmoid Mass - Plasma Cell Neoplasm    Current Outpatient Prescriptions  Medication Sig Dispense Refill  . escitalopram (LEXAPRO) 20 MG tablet Take 10 mg by mouth daily.      . hydrocortisone (CORTEF) 5 MG tablet Take 2.5 mg by mouth daily. Tapering dose      . lansoprazole (PREVACID) 15 MG capsule Take 15 mg by mouth daily.      Marland Kitchen loratadine (CLARITIN) 10 MG  tablet Take 10 mg by mouth daily.      Marland Kitchen LORazepam (ATIVAN) 1 MG tablet Take 1 mg by mouth every 8 (eight) hours as needed. For anxiety      . olmesartan (BENICAR) 20 MG tablet Take 10 mg by mouth daily.      . TESTOSTERONE IM Inject into the muscle every 21 ( twenty-one) days.        ALLERGIES:   has no known allergies.  REVIEW OF SYSTEMS:  The rest of the 14-point review of system was negative.   Filed Vitals:   09/29/12 1110  BP: 115/79  Pulse: 109  Temp: 97.8 F (36.6 C)  Resp: 18   Wt Readings from Last 3 Encounters:  09/29/12 286 lb 12.8 oz (130.092 kg)  07/22/12 273 lb 8 oz (124.059 kg)  06/08/12 264 lb 3.2 oz (119.84 kg)   ECOG Performance status: 0  PHYSICAL EXAMINATION:   General:  well-nourished man, in no acute distress.  Eyes:  no scleral icterus.  ENT:  There were no oropharyngeal lesions.  Neck was without thyromegaly.  There was no sinus tenderness on percussion.   Lymphatics:  Negative cervical, supraclavicular or axillary adenopathy.  Respiratory:  lungs were clear bilaterally without wheezing or crackles.  Cardiovascular:  Regular rate and rhythm, S1/S2, without murmur, rub or gallop.  There was no pedal edema.  GI:  abdomen was soft, flat, nontender, nondistended, without organomegaly.  Muscoloskeletal:  no spinal tenderness of palpation of vertebral spine.  Skin exam was without echymosis, petichae.  Neuro exam was nonfocal.  CN II-XII grossly intact.  His extra occular movement was intact.  There was no nystagmus.  Patient was able to get on and off exam table without assistance.  Gait was normal.  Patient was alerted and oriented.  Attention was good.   Language was appropriate.  Mood was normal without depression.  Speech was not pressured.  Thought content was not tangential.    LABORATORY/RADIOLOGY DATA:  Lab Results  Component Value Date   WBC 6.0 09/23/2012   HGB 14.4 09/23/2012   HCT 40.6 09/23/2012   PLT 141 09/23/2012   GLUCOSE 107* 09/23/2012    ALKPHOS 72 09/23/2012   ALT 64* 09/23/2012   AST 33 09/23/2012   NA 140 09/23/2012   K 3.8 09/23/2012   CL 107 09/23/2012   CREATININE 1.0 09/23/2012   BUN 11.0 09/23/2012   CO2 25 09/23/2012    IMAGING:  I personally reviewed the following CT and showed the husband and his wife the report.   Findings: Mild mucosal thickening or soft tissue is present in the  posterior residual right ethmoid air cells. The patient is status  post partial right ethmoidectomy. The soft tissue is similar to  the MRI scan. No new or progressive mass lesion is present. There  is no osseous destruction.  Minimal mucosal thickening is present in the maxillary sinuses  bilaterally. The left ethmoid air cells are clear. The left  sphenoid sinuses clear. The right sphenoid sinus demonstrates mild  mucosal thickening  Soft tissue windows demonstrate no significant soft tissue mass.  The orbits are intact within normal limits. Limited imaging of the  brain is unremarkable.  IMPRESSION:  1. Mild mucosal thickening or minimal soft tissue and residual  posterior right ethmoid air cells is similar to the prior MRI scan.  2. Status post partial right ethmoidectomy.  3. The majority of the previously seen right ethmoid mass has  resolved.  4. Minimal mucosal disease within the maxillary sinuses  bilaterally as well as the right sphenoid sinus.    ASSESSMENT AND PLAN:    1. Solitary right ethmoid sinus IgA kappa plasmacytoma:  - s/p radiation with complete radiographic response on follow up CT scan.  There was no evidence of disease on clinical history, exam.  CT showed no convincing evidence of disease.   - Prognosis:  Is relatively good given no M-spike; and normalization of his IgA and serum free kappa light chain level.  His light chain now is stable.  - Follow up:  Clinically.  I advised him that there is no role for routine surveillance CT to limit exposure to radiation unless he has focal concerning  symptoms (headache, diplopia, confusion, focal motor weakness).  We will follow his Mspike and light chain in 6 months.   2.  HTN:  On olmesartan per PCP.  3.  Adrenal insufficiency:  On Cortef per PCP /endocrine.  4.  Hypogonadal ism:  On testosterone per Endocrine.   The length of time of the face-to-face encounter was 15  minutes. More than 50% of time was spent counseling and coordination of care.

## 2012-09-29 NOTE — Telephone Encounter (Signed)
gv and printed pt appt schedule for June 2014

## 2013-01-26 ENCOUNTER — Emergency Department (HOSPITAL_COMMUNITY): Payer: No Typology Code available for payment source

## 2013-01-26 ENCOUNTER — Encounter (HOSPITAL_COMMUNITY): Payer: Self-pay

## 2013-01-26 ENCOUNTER — Emergency Department (HOSPITAL_COMMUNITY)
Admission: EM | Admit: 2013-01-26 | Discharge: 2013-01-26 | Disposition: A | Discharge status: Home / Self Care | Payer: No Typology Code available for payment source | Attending: Emergency Medicine | Admitting: Emergency Medicine

## 2013-01-26 DIAGNOSIS — R05 Cough: Secondary | ICD-10-CM | POA: Insufficient documentation

## 2013-01-26 DIAGNOSIS — I10 Essential (primary) hypertension: Secondary | ICD-10-CM | POA: Insufficient documentation

## 2013-01-26 DIAGNOSIS — R110 Nausea: Secondary | ICD-10-CM | POA: Insufficient documentation

## 2013-01-26 DIAGNOSIS — Z923 Personal history of irradiation: Secondary | ICD-10-CM | POA: Insufficient documentation

## 2013-01-26 DIAGNOSIS — S2239XA Fracture of one rib, unspecified side, initial encounter for closed fracture: Secondary | ICD-10-CM | POA: Insufficient documentation

## 2013-01-26 DIAGNOSIS — Y929 Unspecified place or not applicable: Secondary | ICD-10-CM | POA: Insufficient documentation

## 2013-01-26 DIAGNOSIS — S4980XA Other specified injuries of shoulder and upper arm, unspecified arm, initial encounter: Secondary | ICD-10-CM | POA: Insufficient documentation

## 2013-01-26 DIAGNOSIS — K219 Gastro-esophageal reflux disease without esophagitis: Secondary | ICD-10-CM | POA: Insufficient documentation

## 2013-01-26 DIAGNOSIS — R093 Abnormal sputum: Secondary | ICD-10-CM | POA: Insufficient documentation

## 2013-01-26 DIAGNOSIS — Y939 Activity, unspecified: Secondary | ICD-10-CM | POA: Insufficient documentation

## 2013-01-26 DIAGNOSIS — Z8739 Personal history of other diseases of the musculoskeletal system and connective tissue: Secondary | ICD-10-CM | POA: Insufficient documentation

## 2013-01-26 DIAGNOSIS — Z79899 Other long term (current) drug therapy: Secondary | ICD-10-CM | POA: Insufficient documentation

## 2013-01-26 DIAGNOSIS — Z862 Personal history of diseases of the blood and blood-forming organs and certain disorders involving the immune mechanism: Secondary | ICD-10-CM | POA: Insufficient documentation

## 2013-01-26 DIAGNOSIS — R059 Cough, unspecified: Secondary | ICD-10-CM | POA: Insufficient documentation

## 2013-01-26 DIAGNOSIS — S46909A Unspecified injury of unspecified muscle, fascia and tendon at shoulder and upper arm level, unspecified arm, initial encounter: Secondary | ICD-10-CM | POA: Insufficient documentation

## 2013-01-26 DIAGNOSIS — S2232XA Fracture of one rib, left side, initial encounter for closed fracture: Secondary | ICD-10-CM

## 2013-01-26 DIAGNOSIS — X58XXXA Exposure to other specified factors, initial encounter: Secondary | ICD-10-CM | POA: Insufficient documentation

## 2013-01-26 DIAGNOSIS — R0602 Shortness of breath: Secondary | ICD-10-CM | POA: Insufficient documentation

## 2013-01-26 DIAGNOSIS — M545 Low back pain, unspecified: Secondary | ICD-10-CM | POA: Insufficient documentation

## 2013-01-26 LAB — COMPREHENSIVE METABOLIC PANEL
AST: 41 U/L — ABNORMAL HIGH (ref 0–37)
Albumin: 4.2 g/dL (ref 3.5–5.2)
BUN: 16 mg/dL (ref 6–23)
Chloride: 100 mEq/L (ref 96–112)
Creatinine, Ser: 1.05 mg/dL (ref 0.50–1.35)
Potassium: 4.3 mEq/L (ref 3.5–5.1)
Total Bilirubin: 1 mg/dL (ref 0.3–1.2)
Total Protein: 7.3 g/dL (ref 6.0–8.3)

## 2013-01-26 LAB — CBC WITH DIFFERENTIAL/PLATELET
Basophils Absolute: 0 10*3/uL (ref 0.0–0.1)
Basophils Relative: 0 % (ref 0–1)
Eosinophils Absolute: 0.1 10*3/uL (ref 0.0–0.7)
Hemoglobin: 17.2 g/dL — ABNORMAL HIGH (ref 13.0–17.0)
MCH: 31.1 pg (ref 26.0–34.0)
MCHC: 36.1 g/dL — ABNORMAL HIGH (ref 30.0–36.0)
Monocytes Relative: 9 % (ref 3–12)
Neutro Abs: 3.3 10*3/uL (ref 1.7–7.7)
Neutrophils Relative %: 75 % (ref 43–77)
RDW: 13 % (ref 11.5–15.5)

## 2013-01-26 LAB — D-DIMER, QUANTITATIVE: D-Dimer, Quant: <0.27 ug/mL-FEU (ref 0.00–0.48)

## 2013-01-26 LAB — TROPONIN I: Troponin I: <0.3 ng/mL (ref <0.30)

## 2013-01-26 MED ORDER — ONDANSETRON HCL 4 MG/2ML IJ SOLN
4.0000 mg | Freq: Once | INTRAMUSCULAR | Status: AC
  Administered 2013-01-26: 4 mg via INTRAVENOUS
  Filled 2013-01-26: qty 2

## 2013-01-26 MED ORDER — SODIUM CHLORIDE 0.9 % IV SOLN
INTRAVENOUS | Status: DC
  Administered 2013-01-26: 10:00:00 via INTRAVENOUS

## 2013-01-26 MED ORDER — HYDROCODONE-ACETAMINOPHEN 5-325 MG PO TABS: 1.0000 | ORAL_TABLET | Freq: Four times a day (QID) | ORAL | Status: DC | PRN

## 2013-01-26 MED ORDER — SODIUM CHLORIDE 0.9 % IV BOLUS (SEPSIS)
500.0000 mL | Freq: Once | INTRAVENOUS | Status: AC
  Administered 2013-01-26: 10:00:00 via INTRAVENOUS

## 2013-01-26 MED ORDER — HYDROMORPHONE HCL PF 1 MG/ML IJ SOLN
1.0000 mg | Freq: Once | INTRAMUSCULAR | Status: AC
  Administered 2013-01-26: 1 mg via INTRAVENOUS
  Filled 2013-01-26: qty 1

## 2013-01-26 NOTE — ED Notes (Signed)
Pt reports pain between shoulder blades off/on for several days, hurts w/ movement.  Sob at times, when pain is a its worst.  Able to answer ?'s, pt placed on all monitors.  ekg obtained and given to edp, NSR. Wife at bedside.

## 2013-01-26 NOTE — ED Provider Notes (Signed)
History    This chart was scribed for Shelda Jakes, MD by Quintella Reichert, ED scribe.  This patient was seen in room APA05/APA05 and the patient's care was started at 9:15 AM .  CSN: [id number]  Arrival date & time 01/26/13  0825    Chief Complaint  Patient presents with  . Chest Pain     Patient is a 52 y.o. male presenting with chest pain. The history is provided by the patient. No language interpreter was used.  Chest Pain Pain location:  L lateral chest (Radiates from left shoulder and area under left shoulder blade) Pain severity now: Mild-to-severe. Onset quality:  Gradual Duration:  2 months Timing:  Constant Progression:  Waxing and waning Chronicity:  New Relieved by:  None tried Worsened by:  Deep breathing and coughing (Moving arm) Associated symptoms: back pain, cough (Sometimes produces sputum tinged with blood), nausea and shortness of breath   Associated symptoms: no abdominal pain, no dizziness and no fever   Risk factors: high cholesterol, hypertension and male sex     HPI Comments:  Austin Griffith is a 52 y.o. male who presents to the Emergency Department complaining of constant, progressively worsening pain in left shoulder and below left shoulder blade.  He reports that pain began 2 months ago and he sought treatment, but pain was never relieved by medication.  Presently pt describes pain as 2/10, but he states it is 10/10 at its worst.  Pt also reports lower back pain that began several days ago, as well as SOB, nausea, and cough productive of sputum containing blood.  Pt states that pain now radiates to left chest area, beginning last night at 7:30 PM.  He states that pain is exacerbated by arm movement, deep breathing, and coughing.  Pt denies fever, chills, rhinorrhea, sore throat, visual changes, abdominal pain, diarrhea, dysuria, swelling in legs, neck pain, rash, or dizziness.  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  He also recently had a sore that pt and pt's wife speculate may have been MRSA, and pt did not seek treatment for that condition.  Pt has HTN and high cholesterol.   Past Medical History  Diagnosis Date  . Hypertension   . Plasmacytoma      Right Ethmoid Sinus  . Hyperlipidemia     diet control   . AVN (avascular necrosis of bone)     bilateral hips; s/p hip replacement in 1991 and 2009  . Headache 02/08/2012    Right Sided Headache Behind Right Eye  . Epistaxis 02/08/2012  . Blurred vision     Present for "many years"  . Fatigue   . Acid reflux   . History of radiation therapy 02/29/12-04/08/12    right  ethmoid sinus in 50.4Gy in 20 fxs  . Fatigue 05/25/12    "Mild"  . Thrombocytopenia 05/25/12    Past Surgical History  Procedure Laterality Date  . Bilateral hip replacement  1991; and 2009    due to bilateral hip AVN's.   . Mass biopsy  01/26/12    Right Ethmoid Mass - Plasma Cell Neoplasm    Family History  Problem Relation Age of Onset  . Adopted: Yes    History  Substance Use Topics  . Smoking status: Never Smoker   . Smokeless tobacco: Never Used  . Alcohol Use: No     Comment: Hx of Alcohol Abuse. Reported No further intake on 05/25/12  Review of Systems  Constitutional: Negative for fever and chills.  HENT: Negative for sore throat, rhinorrhea and neck pain.   Eyes: Negative for visual disturbance.  Respiratory: Positive for cough (Sometimes produces sputum tinged with blood) and shortness of breath.   Cardiovascular: Positive for chest pain. Negative for leg swelling.  Gastrointestinal: Positive for nausea. Negative for abdominal pain and diarrhea.  Genitourinary: Negative for dysuria.  Musculoskeletal: Positive for back pain.  Skin: Negative for rash.  Neurological: Negative for dizziness.  Hematological: Does not bruise/bleed easily.    Allergies  Review of patient's allergies indicates no known allergies.  Home Medications    Current Outpatient Rx  Name  Route  Sig  Dispense  Refill  . cycloSPORINE (RESTASIS) 0.05 % ophthalmic emulsion   Both Eyes   Place 1 drop into both eyes 2 (two) times daily.         Marland Kitchen escitalopram (LEXAPRO) 20 MG tablet   Oral   Take 10 mg by mouth daily.         Marland Kitchen ibuprofen (ADVIL,MOTRIN) 200 MG tablet   Oral   Take 600 mg by mouth every 6 (six) hours as needed for pain.         Marland Kitchen lansoprazole (PREVACID) 15 MG capsule   Oral   Take 15 mg by mouth daily.         Marland Kitchen loratadine (CLARITIN) 10 MG tablet   Oral   Take 10 mg by mouth daily.         Marland Kitchen LORazepam (ATIVAN) 1 MG tablet   Oral   Take 1 mg by mouth every 8 (eight) hours as needed. For anxiety         . naproxen sodium (ANAPROX) 220 MG tablet   Oral   Take 220 mg by mouth 2 (two) times daily with a meal.         . olmesartan (BENICAR) 20 MG tablet   Oral   Take 20 mg by mouth daily.          Marland Kitchen HYDROcodone-acetaminophen (NORCO/VICODIN) 5-325 MG per tablet   Oral   Take 1-2 tablets by mouth every 6 (six) hours as needed for pain.   20 tablet   0     BP 118/68  Pulse 66  Resp 16  Ht 6\' 5"  (1.956 m)  Wt 295 lb (133.811 kg)  BMI 34.97 kg/m2  SpO2 96%   Physical Exam  Nursing note and vitals reviewed. Constitutional: He is oriented to person, place, and time.  Eyes: Conjunctivae and EOM are normal. Pupils are equal, round, and reactive to light.  Cardiovascular: Normal rate, regular rhythm, normal heart sounds and intact distal pulses.   No murmur heard. Pulmonary/Chest: Effort normal and breath sounds normal. No respiratory distress. He has no wheezes. He has no rales.  Abdominal: Soft. Bowel sounds are normal. There is no tenderness.  Musculoskeletal: Normal range of motion. He exhibits no edema and no tenderness (Nontender in shoulder or upper shoulder blade).  Pain w/ abduction of left shoulder Radial pulse is 1+ on left  Neurological: He is alert and oriented to person, place, and  time. No cranial nerve deficit.    ED Course  Procedures (including critical care time)   COORDINATION OF CARE: 12:03 PM-Discussed treatment plan which includes cardiac monitoring, EKG, labs, pain medication, anti-emetic medication, and IV fluids with pt at bedside and pt agreed to plan.    Medications  0.9 %  sodium chloride infusion (  Intravenous New Bag/Given 01/26/13 0959)  sodium chloride 0.9 % bolus 500 mL (0 mLs Intravenous Stopped 01/26/13 1100)  ondansetron (ZOFRAN) injection 4 mg (4 mg Intravenous Given 01/26/13 0958)  HYDROmorphone (DILAUDID) injection 1 mg (1 mg Intravenous Given 01/26/13 0957)    Labs Reviewed  CBC WITH DIFFERENTIAL - Abnormal; Notable for the following:    Hemoglobin 17.2 (*)    MCHC 36.1 (*)    Platelets 126 (*)    All other components within normal limits  COMPREHENSIVE METABOLIC PANEL - Abnormal; Notable for the following:    AST 41 (*)    ALT 59 (*)    GFR calc non Af Amer 82 (*)    All other components within normal limits  D-DIMER, QUANTITATIVE  TROPONIN I    Dg Chest 2 View  01/26/2013  *RADIOLOGY REPORT*  Clinical Data: Chest pain and shortness of breath.  CHEST - 2 VIEW  Comparison: PA and lateral chest 05/31/2012.  Findings: The lungs are clear.  No pneumothorax or pleural effusion is identified.  There is a fracture of the posterior arc of the left seventh rib which is new since the prior study.  No other fracture is identified.  IMPRESSION:  1.  Acute or subacute left seventh rib fracture. 2.  Negative for pneumothorax or acute cardiopulmonary disease.   Original Report Authenticated By: Holley Dexter, M.D.     Date: 01/26/2013  Rate: 65  Rhythm: normal sinus rhythm  QRS Axis: normal  Intervals: normal  ST/T Wave abnormalities: normal  Conduction Disutrbances:none  Narrative Interpretation:   Old EKG Reviewed: none available  Results for orders placed during the hospital encounter of 01/26/13  D-DIMER, QUANTITATIVE      Result  Value Range   D-Dimer, Quant <0.27  0.00 - 0.48 ug/mL-FEU  CBC WITH DIFFERENTIAL      Result Value Range   WBC 4.5  4.0 - 10.5 K/uL   RBC 5.53  4.22 - 5.81 MIL/uL   Hemoglobin 17.2 (*) 13.0 - 17.0 g/dL   HCT 98.1  19.1 - 47.8 %   MCV 86.3  78.0 - 100.0 fL   MCH 31.1  26.0 - 34.0 pg   MCHC 36.1 (*) 30.0 - 36.0 g/dL   RDW 29.5  62.1 - 30.8 %   Platelets 126 (*) 150 - 400 K/uL   Neutrophils Relative 75  43 - 77 %   Neutro Abs 3.3  1.7 - 7.7 K/uL   Lymphocytes Relative 15  12 - 46 %   Lymphs Abs 0.7  0.7 - 4.0 K/uL   Monocytes Relative 9  3 - 12 %   Monocytes Absolute 0.4  0.1 - 1.0 K/uL   Eosinophils Relative 2  0 - 5 %   Eosinophils Absolute 0.1  0.0 - 0.7 K/uL   Basophils Relative 0  0 - 1 %   Basophils Absolute 0.0  0.0 - 0.1 K/uL  COMPREHENSIVE METABOLIC PANEL      Result Value Range   Sodium 135  135 - 145 mEq/L   Potassium 4.3  3.5 - 5.1 mEq/L   Chloride 100  96 - 112 mEq/L   CO2 26  19 - 32 mEq/L   Glucose, Bld 93  70 - 99 mg/dL   BUN 16  6 - 23 mg/dL   Creatinine, Ser 6.57  0.50 - 1.35 mg/dL   Calcium 9.4  8.4 - 84.6 mg/dL   Total Protein 7.3  6.0 - 8.3 g/dL   Albumin  4.2  3.5 - 5.2 g/dL   AST 41 (*) 0 - 37 U/L   ALT 59 (*) 0 - 53 U/L   Alkaline Phosphatase 96  39 - 117 U/L   Total Bilirubin 1.0  0.3 - 1.2 mg/dL   GFR calc non Af Amer 82 (*) >90 mL/min   GFR calc Af Amer >90  >90 mL/min  TROPONIN I      Result Value Range   Troponin I <0.30  <0.30 ng/mL     1. Rib fractures, left, closed, initial encounter       MDM   Patient's symptoms are consistent with the acute and subacute rib fractures on the left side. No evidence of pneumonia pneumothorax. No evidence of pulmonary edema with a negative d-dimer also no evidence of an acute cardiac event. Normal EKG and negative troponin. Patient's has been having trouble in that area for the past 2 months it would be consistent with her findings.     I personally performed the services described in this  documentation, which was scribed in my presence. The recorded information has been reviewed and is accurate.     Shelda Jakes, MD 01/26/13 [phone]

## 2013-01-26 NOTE — ED Notes (Signed)
Complain of pain in left shoulder that comes around to left chest area. States the pain is worse with any movement. States he can't breath when he walks. Also, states he wants to be check for what he thinks was MRSA ( after looking on internet ) . States he had a sore that was on his left hand that was painful and swollen. A friend told him he may have MRSA.

## 2013-01-30 ENCOUNTER — Encounter: Payer: Self-pay | Admitting: Emergency Medicine

## 2013-03-03 ENCOUNTER — Encounter (HOSPITAL_COMMUNITY): Payer: Self-pay | Admitting: *Deleted

## 2013-03-03 ENCOUNTER — Emergency Department (HOSPITAL_COMMUNITY): Payer: No Typology Code available for payment source

## 2013-03-03 ENCOUNTER — Emergency Department (HOSPITAL_COMMUNITY)
Admission: EM | Admit: 2013-03-03 | Discharge: 2013-03-03 | Disposition: A | Discharge status: Home / Self Care | Payer: No Typology Code available for payment source | Attending: Emergency Medicine | Admitting: Emergency Medicine

## 2013-03-03 DIAGNOSIS — Z79899 Other long term (current) drug therapy: Secondary | ICD-10-CM | POA: Insufficient documentation

## 2013-03-03 DIAGNOSIS — K219 Gastro-esophageal reflux disease without esophagitis: Secondary | ICD-10-CM | POA: Insufficient documentation

## 2013-03-03 DIAGNOSIS — I10 Essential (primary) hypertension: Secondary | ICD-10-CM | POA: Insufficient documentation

## 2013-03-03 DIAGNOSIS — Z8739 Personal history of other diseases of the musculoskeletal system and connective tissue: Secondary | ICD-10-CM | POA: Insufficient documentation

## 2013-03-03 DIAGNOSIS — E785 Hyperlipidemia, unspecified: Secondary | ICD-10-CM | POA: Insufficient documentation

## 2013-03-03 DIAGNOSIS — C9030 Solitary plasmacytoma not having achieved remission: Secondary | ICD-10-CM

## 2013-03-03 DIAGNOSIS — Z862 Personal history of diseases of the blood and blood-forming organs and certain disorders involving the immune mechanism: Secondary | ICD-10-CM | POA: Insufficient documentation

## 2013-03-03 DIAGNOSIS — Z923 Personal history of irradiation: Secondary | ICD-10-CM | POA: Insufficient documentation

## 2013-03-03 DIAGNOSIS — Z87828 Personal history of other (healed) physical injury and trauma: Secondary | ICD-10-CM | POA: Insufficient documentation

## 2013-03-03 DIAGNOSIS — Z8781 Personal history of (healed) traumatic fracture: Secondary | ICD-10-CM | POA: Insufficient documentation

## 2013-03-03 DIAGNOSIS — M549 Dorsalgia, unspecified: Secondary | ICD-10-CM | POA: Insufficient documentation

## 2013-03-03 DIAGNOSIS — Z8669 Personal history of other diseases of the nervous system and sense organs: Secondary | ICD-10-CM | POA: Insufficient documentation

## 2013-03-03 DIAGNOSIS — M8448XA Pathological fracture, other site, initial encounter for fracture: Secondary | ICD-10-CM | POA: Insufficient documentation

## 2013-03-03 DIAGNOSIS — S2232XA Fracture of one rib, left side, initial encounter for closed fracture: Secondary | ICD-10-CM

## 2013-03-03 LAB — CBC
HCT: 41.9 % (ref 39.0–52.0)
MCH: 30.6 pg (ref 26.0–34.0)
MCHC: 35.3 g/dL (ref 30.0–36.0)
MCV: 86.7 fL (ref 78.0–100.0)
Platelets: 116 10*3/uL — ABNORMAL LOW (ref 150–400)
RDW: 13.2 % (ref 11.5–15.5)

## 2013-03-03 LAB — COMPREHENSIVE METABOLIC PANEL
AST: 37 U/L (ref 0–37)
Albumin: 4.1 g/dL (ref 3.5–5.2)
BUN: 16 mg/dL (ref 6–23)
Calcium: 9.4 mg/dL (ref 8.4–10.5)
Creatinine, Ser: 1.05 mg/dL (ref 0.50–1.35)
Total Bilirubin: 0.8 mg/dL (ref 0.3–1.2)

## 2013-03-03 LAB — TROPONIN I: Troponin I: <0.3 ng/mL (ref <0.30)

## 2013-03-03 MED ORDER — IOHEXOL 300 MG/ML  SOLN
80.0000 mL | Freq: Once | INTRAMUSCULAR | Status: AC | PRN
  Administered 2013-03-03: 80 mL via INTRAVENOUS

## 2013-03-03 MED ORDER — HYDROMORPHONE HCL PF 1 MG/ML IJ SOLN
1.0000 mg | Freq: Once | INTRAMUSCULAR | Status: AC
  Administered 2013-03-03: 1 mg via INTRAMUSCULAR

## 2013-03-03 MED ORDER — OXYCODONE-ACETAMINOPHEN 5-325 MG PO TABS
1.0000 | ORAL_TABLET | Freq: Four times a day (QID) | ORAL | Status: DC | PRN
Start: 2013-03-03 — End: 2013-03-14

## 2013-03-03 NOTE — ED Notes (Signed)
Pt states pain to left upper chest and back. Hurts worse to take a deep breath or to raise either arm. Pain x 1 week. Hx of recent rib fracture (7th left rib). Vomited x 3 today, unsure if due to pain or from "nervous stomach" per pt.

## 2013-03-03 NOTE — ED Notes (Signed)
Dr. Valetta Mole in to talk to patient prior to discharge.

## 2013-03-03 NOTE — ED Provider Notes (Signed)
History  This chart was scribed for Suzi Roots, MD by Ardelia Mems, ED Scribe. This patient was seen in room APA16A/APA16A and the patient's care was started at 2:03 PM..   CSN: [id number]  Arrival date & time 03/03/13  1247     Chief Complaint  Patient presents with  . Chest Pain    The history is provided by the patient. No language interpreter was used.    HPI Comments: Austin Griffith is a 52 y.o. male who presents to the Emergency Department complaining of constant, moderate left upper chest pain. Pt states that pain radiates to left shoulder and back. Pt states that he fell on the sidewalk about a month ago and fractured his 7th rib. Pain is worsened when raising either arm, esp left, and movements of left shoulder.  Pt denies cough, abdominal pain, headaches or any other symptoms. Pt denies smoking and is a former alcohol abuser.  Pain worse w movement. No change w breathing/not pleuritic. States seen in ed w same/similar pain w prior rib fx. Denies recent immobility, surgery, travel, leg pain or swelling, cough/hemoptysis, or hx dvt or pe. No hx cad or fam hx cad. No unusual doe or fatigue. Denies prior shoulder or rotator cuff injury. No radicular/arm pain. No numbness/weakness.    Past Medical History  Diagnosis Date  . Hypertension   . Plasmacytoma      Right Ethmoid Sinus  . Hyperlipidemia     diet control   . AVN (avascular necrosis of bone)     bilateral hips; s/p hip replacement in 1991 and 2009  . Headache 02/08/2012    Right Sided Headache Behind Right Eye  . Epistaxis 02/08/2012  . Blurred vision     Present for "many years"  . Fatigue   . Acid reflux   . History of radiation therapy 02/29/12-04/08/12    right  ethmoid sinus in 50.4Gy in 20 fxs  . Fatigue 05/25/12    "Mild"  . Thrombocytopenia 05/25/12    Past Surgical History  Procedure Laterality Date  . Bilateral hip replacement  1991; and 2009    due to bilateral hip AVN's.   . Mass biopsy   01/26/12    Right Ethmoid Mass - Plasma Cell Neoplasm    Family History  Problem Relation Age of Onset  . Adopted: Yes    History  Substance Use Topics  . Smoking status: Never Smoker   . Smokeless tobacco: Never Used  . Alcohol Use: No     Comment: Hx of Alcohol Abuse. Reported No further intake on 05/25/12      Review of Systems  Constitutional: Negative for fever and chills.  HENT: Negative for neck pain.   Eyes: Negative for pain.  Respiratory: Negative for cough.   Cardiovascular: Positive for chest pain. Negative for leg swelling.  Gastrointestinal: Negative for abdominal pain.  Genitourinary: Negative for flank pain.  Musculoskeletal: Positive for back pain.  Skin: Negative for rash.  Neurological: Negative for headaches.  Hematological: Does not bruise/bleed easily.  Psychiatric/Behavioral: Negative for confusion.   A complete 10 system review of systems was obtained and all systems are negative except as noted in the HPI and PMH.    Allergies  Review of patient's allergies indicates no known allergies.  Home Medications   Current Outpatient Rx  Name  Route  Sig  Dispense  Refill  . cycloSPORINE (RESTASIS) 0.05 % ophthalmic emulsion   Both Eyes   Place 1 drop into  both eyes 2 (two) times daily.         Marland Kitchen escitalopram (LEXAPRO) 20 MG tablet   Oral   Take 10 mg by mouth daily.         Marland Kitchen HYDROcodone-acetaminophen (NORCO/VICODIN) 5-325 MG per tablet   Oral   Take 1-2 tablets by mouth every 6 (six) hours as needed for pain.   20 tablet   0   . ibuprofen (ADVIL,MOTRIN) 200 MG tablet   Oral   Take 600 mg by mouth every 6 (six) hours as needed for pain.         Marland Kitchen lansoprazole (PREVACID) 15 MG capsule   Oral   Take 15 mg by mouth daily.         Marland Kitchen loratadine (CLARITIN) 10 MG tablet   Oral   Take 10 mg by mouth daily.         Marland Kitchen LORazepam (ATIVAN) 1 MG tablet   Oral   Take 1 mg by mouth every 8 (eight) hours as needed. For anxiety          . naproxen sodium (ANAPROX) 220 MG tablet   Oral   Take 220 mg by mouth 2 (two) times daily with a meal.         . olmesartan (BENICAR) 20 MG tablet   Oral   Take 20 mg by mouth daily.            Triage Vitals: BP 121/72  Pulse 60  Temp(Src) 97.4 F (36.3 C) (Oral)  Resp 20  Ht 6\' 5"  (1.956 m)  Wt 275 lb (124.739 kg)  BMI 32.6 kg/m2  SpO2 100%  Physical Exam  Constitutional: He is oriented to person, place, and time. He appears well-developed and well-nourished.  HENT:  Head: Normocephalic.  Eyes: Pupils are equal, round, and reactive to light. No scleral icterus.  Neck: No tracheal deviation present.  Cardiovascular: Normal rate, regular rhythm, normal heart sounds and intact distal pulses.  Exam reveals no gallop and no friction rub.   No murmur heard. Pulmonary/Chest: Effort normal and breath sounds normal. No respiratory distress. He exhibits tenderness.  Marked chest wall/left shoulder tenderness reproducing symptoms. No crepitus.   Abdominal: Soft. He exhibits no distension and no mass. There is no tenderness. There is no rebound and no guarding.  Genitourinary:  No cva tenderness  Musculoskeletal: Normal range of motion. He exhibits no edema and no tenderness.  Left chest wall tenderness with palpation and movement of shoulder.  Neurological: He is alert and oriented to person, place, and time.  Skin: Skin is warm. No rash noted.  No shingles/rash in area of pain  Psychiatric: He has a normal mood and affect.    ED Course  Procedures (including critical care time)  DIAGNOSTIC STUDIES: Oxygen Saturation is 100% on RA, normal by my interpretation.    COORDINATION OF CARE: 2:06 PM- Pt advised of plan for treatment and pt agrees.  Medications  HYDROmorphone (DILAUDID) injection 1 mg (not administered)    Results for orders placed during the hospital encounter of 03/03/13  CBC      Result Value Range   WBC 3.9 (*) 4.0 - 10.5 K/uL   RBC 4.83  4.22 -  5.81 MIL/uL   Hemoglobin 14.8  13.0 - 17.0 g/dL   HCT 16.1  09.6 - 04.5 %   MCV 86.7  78.0 - 100.0 fL   MCH 30.6  26.0 - 34.0 pg   MCHC 35.3  30.0 -  36.0 g/dL   RDW 16.1  09.6 - 04.5 %   Platelets 116 (*) 150 - 400 K/uL  COMPREHENSIVE METABOLIC PANEL      Result Value Range   Sodium 138  135 - 145 mEq/L   Potassium 4.5  3.5 - 5.1 mEq/L   Chloride 101  96 - 112 mEq/L   CO2 27  19 - 32 mEq/L   Glucose, Bld 88  70 - 99 mg/dL   BUN 16  6 - 23 mg/dL   Creatinine, Ser 4.09  0.50 - 1.35 mg/dL   Calcium 9.4  8.4 - 81.1 mg/dL   Total Protein 7.1  6.0 - 8.3 g/dL   Albumin 4.1  3.5 - 5.2 g/dL   AST 37  0 - 37 U/L   ALT 51  0 - 53 U/L   Alkaline Phosphatase 91  39 - 117 U/L   Total Bilirubin 0.8  0.3 - 1.2 mg/dL   GFR calc non Af Amer 82 (*) >90 mL/min   GFR calc Af Amer >90  >90 mL/min  TROPONIN I      Result Value Range   Troponin I <0.30  <0.30 ng/mL   Dg Chest 2 View  03/03/2013   *RADIOLOGY REPORT*  Clinical Data: Left-sided chest pain for 3 months, no known injury  CHEST - 2 VIEW  Comparison: Prior chest x-ray 01/26/2013; prior PET CT 02/11/2012  Findings: Irregular appearing fracture of the posterolateral aspect of the left seventh rib.  There appears to be increased lucency and rarefaction of the bone compared to the prior chest x-ray raising concern for an underlying aggressive lytic lesion.  Cardiac and mediastinal contours within normal limits.  No pneumothorax, pleural effusion or suspicious pulmonary nodule.  The lungs are clear.  IMPRESSION:  Probable destructive lytic lesion and associated pathologic fracture involving the posterolateral aspect of the left seventh rib.  Given the patient's history of prior right ethmoid sinus plasmacytoma, this is concerning for metastatic plasmacytoma/multiple myeloma.  Consider further evaluation with dedicated non emergent chest CT or PET CT.   Original Report Authenticated By: Malachy Moan, M.D.   Ct Chest W Contrast  03/03/2013    *RADIOLOGY REPORT*  Clinical Data: Fall a few months ago with rib fracture on the left. New shortness of breath and left chest pain  CT CHEST WITH CONTRAST  Technique:  Multidetector CT imaging of the chest was performed following the standard protocol during bolus administration of intravenous contrast.  Contrast: 80mL OMNIPAQUE IOHEXOL 300 MG/ML  SOLN  Comparison: 02/11/2012 PET CT  Findings: Normal thyroid gland.  No evidence of mediastinal hemorrhage, adenopathy, or abnormal fluid.  No pericardial effusion.  No evidence of aortic injury.  Specifically no abrupt caliber change or intimal irregularity.  Mild atherosclerotic vascular calcifications of the aortic arch.  No pneumothorax or pleural effusion.  Clear lungs.  A pathologic left posterolateral 7th rib fracture is noted.  There is a soft tissue mass in the lytic process associated with the fractured ribs.  There is bony expansion of the lesion with cortical breakthrough.  No other obvious destructive bony lesions can be identified.  IMPRESSION: The fracture of the left seventh rib represents a pathologic fracture.  There is a destructive soft tissue mass at the site of the fracture worrisome for malignancy.  Otherwise, no acute process in the thorax.  The patient does have a history of head and neck plasmacytoma.   Original Report Authenticated By: Jolaine Click, M.D.  MDM  I personally performed the services described in this documentation, which was scribed in my presence. The recorded information has been reviewed and is accurate.  Labs, cxr.   Reviewed nursing notes and prior charts for additional history.   Pt has ride, does not have to drive. Dilaudid 1 mg im.   Date: 03/03/2013  Rate: 52  Rhythm: sinus bradycardia  QRS Axis: normal  Intervals: normal  ST/T Wave abnormalities: normal  Conduction Disutrbances:none  Narrative Interpretation:   Old EKG Reviewed: unchanged  pts pain appears very musculoskeletal/reproducible.   cxr w rib fx.   Ct c/cw pathologic rib fx.  Pt w hx plasmacytoma ethmoid sinus, tx w radiation tx in past.  States had bone marrow bx done then otherwise neg.   Discussed ct w pt and need heme/onc follow up.  Recheck pt comfortable, pain controlled. Stable for d/c.   Suzi Roots, MD 03/03/13 [phone]

## 2013-03-07 ENCOUNTER — Telehealth: Payer: Self-pay | Admitting: Oncology

## 2013-03-07 NOTE — Telephone Encounter (Signed)
pt called to r/s appt to dooner d/t due to ER finding mass that broke ribs.Marland KitchenMarland KitchenMarland KitchenDone

## 2013-03-08 ENCOUNTER — Other Ambulatory Visit (HOSPITAL_BASED_OUTPATIENT_CLINIC_OR_DEPARTMENT_OTHER): Payer: No Typology Code available for payment source

## 2013-03-08 DIAGNOSIS — D729 Disorder of white blood cells, unspecified: Secondary | ICD-10-CM

## 2013-03-08 DIAGNOSIS — D7289 Other specified disorders of white blood cells: Secondary | ICD-10-CM

## 2013-03-08 LAB — COMPREHENSIVE METABOLIC PANEL (CC13)
ALT: 56 U/L — ABNORMAL HIGH (ref 0–55)
AST: 32 U/L (ref 5–34)
Albumin: 3.8 g/dL (ref 3.5–5.0)
Alkaline Phosphatase: 86 U/L (ref 40–150)
Calcium: 9.2 mg/dL (ref 8.4–10.4)
Chloride: 105 mEq/L (ref 98–107)
Potassium: 3.9 mEq/L (ref 3.5–5.1)
Sodium: 139 mEq/L (ref 136–145)
Total Protein: 7 g/dL (ref 6.4–8.3)

## 2013-03-08 LAB — CBC WITH DIFFERENTIAL/PLATELET
BASO%: 0.7 % (ref 0.0–2.0)
Eosinophils Absolute: 0.1 10*3/uL (ref 0.0–0.5)
HCT: 45.2 % (ref 38.4–49.9)
LYMPH%: 18.6 % (ref 14.0–49.0)
MCHC: 36.3 g/dL — ABNORMAL HIGH (ref 32.0–36.0)
MONO#: 0.2 10*3/uL (ref 0.1–0.9)
NEUT#: 2.7 10*3/uL (ref 1.5–6.5)
Platelets: 120 10*3/uL — ABNORMAL LOW (ref 140–400)
RBC: 5.08 10*6/uL (ref 4.20–5.82)
WBC: 3.7 10*3/uL — ABNORMAL LOW (ref 4.0–10.3)
lymph#: 0.7 10*3/uL — ABNORMAL LOW (ref 0.9–3.3)

## 2013-03-10 ENCOUNTER — Other Ambulatory Visit (HOSPITAL_COMMUNITY)
Admission: RE | Admit: 2013-03-10 | Discharge: 2013-03-10 | Disposition: A | Discharge status: Home / Self Care | Payer: No Typology Code available for payment source | Source: Ambulatory Visit | Attending: Oncology | Admitting: Oncology

## 2013-03-10 ENCOUNTER — Encounter: Payer: Self-pay | Admitting: Oncology

## 2013-03-10 ENCOUNTER — Ambulatory Visit (HOSPITAL_BASED_OUTPATIENT_CLINIC_OR_DEPARTMENT_OTHER): Payer: No Typology Code available for payment source | Admitting: Oncology

## 2013-03-10 ENCOUNTER — Telehealth: Payer: Self-pay | Admitting: Oncology

## 2013-03-10 VITALS — BP 108/73 | HR 97 | Temp 97.6°F

## 2013-03-10 VITALS — BP 107/67 | HR 93 | Temp 97.5°F | Resp 18 | Ht 77.0 in | Wt 273.7 lb

## 2013-03-10 DIAGNOSIS — R634 Abnormal weight loss: Secondary | ICD-10-CM

## 2013-03-10 DIAGNOSIS — C888 Other malignant immunoproliferative diseases not having achieved remission: Secondary | ICD-10-CM

## 2013-03-10 DIAGNOSIS — D72819 Decreased white blood cell count, unspecified: Secondary | ICD-10-CM | POA: Insufficient documentation

## 2013-03-10 DIAGNOSIS — C9030 Solitary plasmacytoma not having achieved remission: Secondary | ICD-10-CM | POA: Insufficient documentation

## 2013-03-10 DIAGNOSIS — D729 Disorder of white blood cells, unspecified: Secondary | ICD-10-CM

## 2013-03-10 DIAGNOSIS — R110 Nausea: Secondary | ICD-10-CM

## 2013-03-10 DIAGNOSIS — D696 Thrombocytopenia, unspecified: Secondary | ICD-10-CM | POA: Insufficient documentation

## 2013-03-10 LAB — PROTEIN ELECTROPHORESIS, SERUM
Alpha-1-Globulin: 4.3 % (ref 2.9–4.9)
Alpha-2-Globulin: 8 % (ref 7.1–11.8)
Beta Globulin: 7.6 % — ABNORMAL HIGH (ref 4.7–7.2)
Total Protein, Serum Electrophoresis: 7 g/dL (ref 6.0–8.3)

## 2013-03-10 LAB — KAPPA/LAMBDA LIGHT CHAINS
Kappa free light chain: 37.6 mg/dL — ABNORMAL HIGH (ref 0.33–1.94)
Kappa:Lambda Ratio: 21.12 — ABNORMAL HIGH (ref 0.26–1.65)
Lambda Free Lght Chn: 1.78 mg/dL (ref 0.57–2.63)

## 2013-03-10 MED ORDER — MORPHINE SULFATE ER 15 MG PO TBCR: 15.0000 mg | EXTENDED_RELEASE_TABLET | Freq: Two times a day (BID) | ORAL | Status: DC

## 2013-03-10 MED ORDER — DEXAMETHASONE 4 MG PO TABS: 4.0000 mg | ORAL_TABLET | Freq: Four times a day (QID) | ORAL | Status: DC

## 2013-03-10 MED ORDER — HYDROCODONE-ACETAMINOPHEN 7.5-325 MG PO TABS: 1.0000 | ORAL_TABLET | Freq: Four times a day (QID) | ORAL | Status: DC | PRN

## 2013-03-10 MED ORDER — ONDANSETRON HCL 8 MG PO TABS: 8.0000 mg | ORAL_TABLET | Freq: Three times a day (TID) | ORAL | Status: DC | PRN

## 2013-03-10 NOTE — Progress Notes (Signed)
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   PAST THERAPY:  Radiation therapy 50.4 gray in 20 fractions by Dr. Basilio Cairo between 02/29/2012 and 04/08/2012.   CURRENT THERAPY:  Watchful observation.   INTERVAL HISTORY: Austin Griffith 52 y.o. male returns for a work in appointment with his wife.  Patient was seen today do to left rib pain. Pain has been going on for about 3 months. He initially thought the pain was due to a pulled muscle however his pain was worsening. He was seen in the emergency room about 3-4 weeks ago with a chest x-ray which showed a broken rib. He denies any injury to the area. His pain continued to worsen and he was seen in the emergency room on May 23rd 2014. A chest x-ray was initially done which again showed the left rib fracture, but there was concern for a lytic lesion. A CT scan was ordered showing a pathologic left posterior lateral seventh rib fracture. There was also soft tissue mass and a lytic process associated with the fractured ribs. He was given Percocet and advised to followup with oncology. The patient has been taking the Percocet, but this makes him nauseated causes vomiting. He still has intermittent nausea and needs a refill on nausea medication. He had hydrocodone/APAP 05/325 mg at home which she has been using. He tolerates this better, but pain is not completely controlled. To his pain, he is spending a lot of time in bed. He is not driving at this time due to the pain. He is having difficulty moving his left shoulder due to the radiation of pain up to this area. He reports that his appetite is good but he has lost 13 pounds since his last visit was in December 2013. Reports dyspnea on exertion. No shortness of breath at rest. No headaches, visual disturbances, dysphasia, diplopia or bleeding. The rest of  the 14 point reviews of system was negative.    Past Medical History  Diagnosis Date  . Hypertension   . Plasmacytoma      Right Ethmoid Sinus  . Hyperlipidemia     diet control   . AVN (avascular necrosis of bone)     bilateral hips; s/p hip replacement in 1991 and 2009  . Headache(784.0) 02/08/2012    Right Sided Headache Behind Right Eye  . Epistaxis 02/08/2012  . Blurred vision     Present for "many years"  . Fatigue   . Acid reflux   . History of radiation therapy 02/29/12-04/08/12    right  ethmoid sinus in 50.4Gy in 20 fxs  . Fatigue 05/25/12    "Mild"  . Thrombocytopenia 05/25/12    Past Surgical History  Procedure Laterality Date  . Bilateral hip replacement  1991; and 2009    due to bilateral hip AVN's.   . Mass biopsy  01/26/12    Right Ethmoid Mass - Plasma Cell Neoplasm    Current Outpatient Prescriptions  Medication Sig Dispense Refill  . cycloSPORINE (RESTASIS) 0.05 % ophthalmic emulsion Place 1 drop into both eyes 2 (two) times daily.      Marland Kitchen dexamethasone (DECADRON) 4 MG tablet Take 1 tablet (4 mg total) by mouth every 6 (six) hours.  120 tablet  0  . escitalopram (LEXAPRO) 20 MG tablet Take 10 mg by mouth daily.      Marland Kitchen  HYDROcodone-acetaminophen (NORCO) 7.5-325 MG per tablet Take 1 tablet by mouth every 6 (six) hours as needed for pain.  60 tablet  0  . ibuprofen (ADVIL,MOTRIN) 200 MG tablet Take 600 mg by mouth every 6 (six) hours as needed for pain.      Marland Kitchen lansoprazole (PREVACID) 15 MG capsule Take 15 mg by mouth daily.      Marland Kitchen LORazepam (ATIVAN) 1 MG tablet Take 1 mg by mouth every 8 (eight) hours as needed for anxiety.       Marland Kitchen morphine (MS CONTIN) 15 MG 12 hr tablet Take 1 tablet (15 mg total) by mouth 2 (two) times daily.  60 tablet  0  . naproxen sodium (ALEVE) 220 MG tablet Take 220 mg by mouth 2 (two) times daily as needed (Pain).      Marland Kitchen olmesartan (BENICAR) 20 MG tablet Take 20 mg by mouth daily.       . ondansetron (ZOFRAN) 8 MG tablet Take 1 tablet (8  mg total) by mouth every 8 (eight) hours as needed for nausea.  20 tablet  2  . oxyCODONE-acetaminophen (PERCOCET/ROXICET) 5-325 MG per tablet Take 1-2 tablets by mouth every 6 (six) hours as needed for pain.  30 tablet  0  . triamcinolone (NASACORT) 55 MCG/ACT nasal inhaler Place 2 sprays into the nose daily.       No current facility-administered medications for this visit.    ALLERGIES:  has No Known Allergies.  REVIEW OF SYSTEMS:  The rest of the 14-point review of system was negative.   Filed Vitals:   03/10/13 1006  BP: 107/67  Pulse: 93  Temp: 97.5 F (36.4 C)  Resp: 18   Wt Readings from Last 3 Encounters:  03/10/13 273 lb 11.2 oz (124.15 kg)  03/03/13 275 lb (124.739 kg)  01/26/13 295 lb (133.811 kg)   ECOG Performance status: 1-2  PHYSICAL EXAMINATION:   General:  well-nourished man, in obvious pain.  Eyes:  no scleral icterus.  ENT:  There were no oropharyngeal lesions.  Neck was without thyromegaly.  There was no sinus tenderness on percussion.   Lymphatics:  Negative cervical, supraclavicular or axillary adenopathy.  Respiratory: lungs were clear bilaterally without wheezing or crackles.  Cardiovascular:  Regular rate and rhythm, S1/S2, without murmur, rub or gallop.  There was no pedal edema.  GI:  abdomen was soft, flat, nontender, nondistended, without organomegaly.  Muscoloskeletal:  no spinal tenderness of palpation of vertebral spine.  Pain noted with palpation over the left anterior ribs. Skin exam was without echymosis, petichae.  Neuro exam was nonfocal.  CN II-XII grossly intact.  His extra occular movement was intact.  There was no nystagmus.  Patient was able to get on and off exam table without assistance.  Gait was normal.  Patient was alerted and oriented.  Attention was good.   Language was appropriate.  Mood was normal without depression.  Speech was not pressured.  Thought content was not tangential.    LABORATORY/RADIOLOGY DATA:  Lab Results  Component  Value Date   WBC 3.7* 03/08/2013   HGB 16.4 03/08/2013   HCT 45.2 03/08/2013   PLT 120* 03/08/2013   GLUCOSE 118* 03/08/2013   ALKPHOS 86 03/08/2013   ALT 56* 03/08/2013   AST 32 03/08/2013   NA 139 03/08/2013   K 3.9 03/08/2013   CL 105 03/08/2013   CREATININE 1.1 03/08/2013   BUN 15.7 03/08/2013   CO2 25 03/08/2013   SPEP is pending. Kappa:Lambda ratio  21.12.  IMAGING:    *RADIOLOGY REPORT*  Clinical Data: Fall a few months ago with rib fracture on the left.  New shortness of breath and left chest pain  CT CHEST WITH CONTRAST  Technique: Multidetector CT imaging of the chest was performed  following the standard protocol during bolus administration of  intravenous contrast.  Contrast: 80mL OMNIPAQUE IOHEXOL 300 MG/ML SOLN  Comparison: 02/11/2012 PET CT  Findings: Normal thyroid gland. No evidence of mediastinal  hemorrhage, adenopathy, or abnormal fluid. No pericardial  effusion. No evidence of aortic injury. Specifically no abrupt  caliber change or intimal irregularity. Mild atherosclerotic  vascular calcifications of the aortic arch.  No pneumothorax or pleural effusion.  Clear lungs.  A pathologic left posterolateral 7th rib fracture is noted. There  is a soft tissue mass in the lytic process associated with the  fractured ribs. There is bony expansion of the lesion with  cortical breakthrough. No other obvious destructive bony lesions  can be identified.  IMPRESSION:  The fracture of the left seventh rib represents a pathologic  fracture. There is a destructive soft tissue mass at the site of  the fracture worrisome for malignancy.  Otherwise, no acute process in the thorax. The patient does have a  history of head and neck plasmacytoma.  Original Report Authenticated By: Jolaine Click, M.D.  ASSESSMENT AND PLAN:    1. Solitary right ethmoid sinus IgA kappa plasmacytoma:  - s/p radiation with complete radiographic response on follow up CT scan.   - The patient now has a  new fracture of the left seventh rib which represents a pathologic fracture. There is also destructive soft tissue mass at the site of the fracture worrisome for malignancy. His SPEP is pending, but his Kappa: Lambda light chains are elevated. - Workup: I have requested a PET CT scan as soon as possible to evaluate for other areas of involvement. He will also have a bone scan as soon as possible. I have requested a 24-hour urine protein electrophoresis. He had a bone marrow biopsy by Dr Gaylyn Rong in our office today on an urgent basis. - Follow up:  He will followup with Dr Gaylyn Rong in our office next week to review the results of his bone marrow biopsy and scans if they are available.  2. Left rib pain; due to lytic lesion: I have referred him to Dr. Basilio Cairo for consideration of palliative radiation to this area. I have given a prescription for MS Contin 15 mg twice a day and have given him a prescription for hydrocodone/APAP 7.5/325 mg every 6 hours as needed. I have also started him on dexamethasone 4 mg every 6 hours.  3. Nausea: I have refilled his Zofran.  4. weight loss: He states that he has a good appetite. He will likely gain weight back on dexamethasone. We will consider referral to the dietitian for continued weight loss.  5.  HTN:  On olmesartan per PCP.  6. followup: Next week with Dr. Gaylyn Rong review the results of his tests.  Case reviewed with Dr Gaylyn Rong.   The length of time of the face-to-face encounter was 60  minutes. More than 50% of time was spent counseling and coordination of care.

## 2013-03-10 NOTE — Progress Notes (Signed)
See bone marrow biopsy noted dated 03/10/2013.

## 2013-03-10 NOTE — Procedures (Signed)
   [facility]  Telephone:([phone]) [phone] Fax:([phone]) [phone]   BONE MARROW BIOPSY AND ASPIRATION   INDICATION:  History of plasmacytoma; now with worsening light chain, bone pain, concerning for myeloma.   Procedure: After obtained from consent, ARWIN BISCEGLIA was placed in the prone position. Time out was performed verifying correct patient and procedure. The skin overlying the left posterior crest was prepped with Betadine and draped in the usual sterile fashion. The skin and periosteum were infiltrated with 10 mL of 2% lidocaine x3 as he was still uncomfortable with the first two 10mL. A small puncture wound was made with #11 scalpel blade.  Bone marrow aspirate was obtained on the first pass of the aspiration needle.  One separate core biopsy was obtained through the same incision.   The aspirate was sent for routine histology, flow cytometry, myeloma FISH panel, and cytogenetics.  Core biopsy was sent for routine histology.   [name] tolerated procedure well with minimal  blood loss and without immediate complication.   A sterile dressing was applied.   Jethro Bolus M.D. 03/10/2013

## 2013-03-10 NOTE — Patient Instructions (Addendum)
Bone Marrow Aspiration, Bone Marrow Biopsy Care After Read the instructions outlined below and refer to this sheet in the next few weeks. These discharge instructions provide you with general information on caring for yourself after you leave the hospital. Your caregiver may also give you specific instructions. While your treatment has been planned according to the most current medical practices available, unavoidable complications occasionally occur. If you have any problems or questions after discharge, call your caregiver. FINDING OUT THE RESULTS OF YOUR TEST Not all test results are available during your visit. If your test results are not back during the visit, make an appointment with your caregiver to find out the results. Do not assume everything is normal if you have not heard from your caregiver or the medical facility. It is important for you to follow up on all of your test results.  HOME CARE INSTRUCTIONS   Keep your dressing clean and dry. You may replace dressing with a bandage after 24 hours.  May use a bandaid.   Check bandage several times today and if any bleeding soaking through dressing, then lay on area for 30 minutes.  If bleeding continues call Dr. Lodema Pilot office [phone].   You may take a bath or shower after 24 hours.  Use an ice pack for 20 minutes every 2 hours while awake for pain as needed. SEEK MEDICAL CARE IF:   There is redness, swelling, or increasing pain at the biopsy site.  There is pus coming from the biopsy site.  There is drainage from a biopsy site lasting longer than one day.  An unexplained oral temperature above 102 F (38.9 C) develops. SEEK IMMEDIATE MEDICAL CARE IF:   You develop a rash.  You have difficulty breathing.  You develop any reaction or side effects to medications given.   Document Released: 04/17/2005 Document Revised: 12/21/2011 Document Reviewed: 09/25/2008 [facility] Patient Information 2014 [place], [place].

## 2013-03-13 ENCOUNTER — Telehealth: Payer: Self-pay | Admitting: *Deleted

## 2013-03-13 ENCOUNTER — Other Ambulatory Visit: Payer: Self-pay | Admitting: *Deleted

## 2013-03-13 NOTE — Telephone Encounter (Signed)
Call from [place] in Radiology,  States PET scan not approved by insurance company yet.  They are still working on the Pre Cert and have to cancel PET for tomorrow morning.  They will r/s PET to this Friday 6/06 in hopes they will have approval by then.   Called wife to notify of PET r/s to Friday.  May need to r/s office visit w/ Dr. Gaylyn Rong as well.  Asked her to check w/ desk nurse tomorrow after pt sees Dr. Basilio Cairo. She verbalized understanding.

## 2013-03-14 ENCOUNTER — Ambulatory Visit
Admission: RE | Admit: 2013-03-14 | Discharge: 2013-03-14 | Disposition: A | Discharge status: Home / Self Care | Payer: No Typology Code available for payment source | Source: Ambulatory Visit | Attending: Radiation Oncology | Admitting: Radiation Oncology

## 2013-03-14 ENCOUNTER — Telehealth: Payer: Self-pay | Admitting: Oncology

## 2013-03-14 ENCOUNTER — Encounter (HOSPITAL_COMMUNITY): Payer: No Typology Code available for payment source

## 2013-03-14 ENCOUNTER — Other Ambulatory Visit: Payer: Self-pay | Admitting: Oncology

## 2013-03-14 ENCOUNTER — Ambulatory Visit (HOSPITAL_COMMUNITY): Payer: No Typology Code available for payment source

## 2013-03-14 ENCOUNTER — Ambulatory Visit: Payer: No Typology Code available for payment source | Admitting: Lab

## 2013-03-14 ENCOUNTER — Encounter: Payer: Self-pay | Admitting: Radiation Oncology

## 2013-03-14 VITALS — BP 113/70 | HR 71 | Temp 97.7°F | Ht 77.0 in | Wt 278.0 lb

## 2013-03-14 DIAGNOSIS — C7951 Secondary malignant neoplasm of bone: Secondary | ICD-10-CM | POA: Insufficient documentation

## 2013-03-14 DIAGNOSIS — Z510 Encounter for antineoplastic radiation therapy: Secondary | ICD-10-CM | POA: Insufficient documentation

## 2013-03-14 DIAGNOSIS — Z79899 Other long term (current) drug therapy: Secondary | ICD-10-CM | POA: Insufficient documentation

## 2013-03-14 DIAGNOSIS — C9030 Solitary plasmacytoma not having achieved remission: Secondary | ICD-10-CM | POA: Insufficient documentation

## 2013-03-14 DIAGNOSIS — C311 Malignant neoplasm of ethmoidal sinus: Secondary | ICD-10-CM

## 2013-03-14 DIAGNOSIS — Z923 Personal history of irradiation: Secondary | ICD-10-CM | POA: Insufficient documentation

## 2013-03-14 DIAGNOSIS — Z8522 Personal history of malignant neoplasm of nasal cavities, middle ear, and accessory sinuses: Secondary | ICD-10-CM | POA: Insufficient documentation

## 2013-03-14 DIAGNOSIS — C9000 Multiple myeloma not having achieved remission: Secondary | ICD-10-CM

## 2013-03-14 DIAGNOSIS — M8448XA Pathological fracture, other site, initial encounter for fracture: Secondary | ICD-10-CM | POA: Insufficient documentation

## 2013-03-14 DIAGNOSIS — R079 Chest pain, unspecified: Secondary | ICD-10-CM | POA: Insufficient documentation

## 2013-03-14 DIAGNOSIS — D729 Disorder of white blood cells, unspecified: Secondary | ICD-10-CM

## 2013-03-14 NOTE — Progress Notes (Signed)
  Radiation Oncology         [phone]) [phone] ________________________________  Name: [name] MRN: [id number]  Date: 03/14/2013  DOB: 03-30-1961  SIMULATION AND TREATMENT PLANNING NOTE  outpatient  DIAGNOSIS:  Lytic 7th rib lesion (left)  NARRATIVE:  The patient was brought to the CT Simulation planning suite.  Identity was confirmed.  All relevant records and images related to the planned course of therapy were reviewed.  The patient freely provided informed written consent to proceed with treatment after reviewing the details related to the planned course of therapy. The consent form was witnessed and verified by the simulation staff.    Then, the patient was set-up in a stable reproducible  supine position for radiation therapy - arms in wingboard.  CT images were obtained.  Surface markings were placed.  The CT images were loaded into the planning software.    TREATMENT PLANNING NOTE: Treatment planning then occurred.  The radiation prescription was entered and confirmed.    A total of 2 medically necessary complex treatment devices were fabricated and supervised by me. I have requested : MLC's for 2 oblique fields to shield lung tissue ; and,  dose calc's/ isodose plan.  The patient will receive   25Gy in 10 fractions.   -----------------------------------  Lonie Peak, MD

## 2013-03-14 NOTE — Progress Notes (Signed)
Radiation Oncology         [phone]) [phone] ________________________________  Name: Austin Griffith MRN: [id number]  Date: 03/14/2013  DOB: 21-May-1961  Re-Consult Visit Note  Outpatient  CC: Colette Ribas, MD  Exie Parody, MD  Diagnosis:  Suspected multiple myeloma with new lytic lesion, posterior left 7th rib  Prior Radiation treatment dates: 02/29/2012-04/08/2012  Site/dose: Ethmoid sinus tumor/50.4 gray in 28 fractions   Narrative:  The patient returns today for reconsultation.       A few months ago he fell on his left side. A week later he developed pain in the mid left scapular region. He seems to hold a muscle. The chest x-ray showed a posterior left seventh rib fracture. There is concern for lytic lesion. CT scan was ordered of his chest which shows a left posterior seventh rib lesion that is lytic and concerning for pathologic fracture.    He was recently started on dexamethasone in medical oncology and his pain improved substantially right away. I spoke with medical oncology, Dr. Velna Ochs, he reports that the patient's kappa lambda chains are highly suspicious for systemic process. He says his bile marrow biopsy and the results of that are pending. He has a PET scan and a bone scan pending. Patient denies any weight loss. He denies any weakness. He denies any headaches. He was seen by an endocrinologist late last year and was given testosterone temporarily. The patient was also placed on a Decadron taper which eventually he was able to tolerate and get off of Decadron completely. He has had watery eyes and takes daily eyedrops as prescribed by his eye doctor. His headaches resolved when he got his glasses changed.   ALLERGIES:  has No Known Allergies.  Meds: Current Outpatient Prescriptions  Medication Sig Dispense Refill  . cycloSPORINE (RESTASIS) 0.05 % ophthalmic emulsion Place 1 drop into both eyes 2 (two) times daily.      Marland Kitchen dexamethasone (DECADRON) 4 MG tablet Take 1 tablet (4  mg total) by mouth every 6 (six) hours.  120 tablet  0  . escitalopram (LEXAPRO) 20 MG tablet Take 10 mg by mouth daily.      Marland Kitchen HYDROcodone-acetaminophen (NORCO) 7.5-325 MG per tablet Take 1 tablet by mouth every 6 (six) hours as needed for pain.  60 tablet  0  . ibuprofen (ADVIL,MOTRIN) 200 MG tablet Take 600 mg by mouth every 6 (six) hours as needed for pain.      Marland Kitchen lansoprazole (PREVACID) 15 MG capsule Take 15 mg by mouth daily.      Marland Kitchen LORazepam (ATIVAN) 1 MG tablet Take 1 mg by mouth every 8 (eight) hours as needed for anxiety.       Marland Kitchen morphine (MS CONTIN) 15 MG 12 hr tablet Take 1 tablet (15 mg total) by mouth 2 (two) times daily.  60 tablet  0  . naproxen sodium (ALEVE) 220 MG tablet Take 220 mg by mouth 2 (two) times daily as needed (Pain).      Marland Kitchen olmesartan (BENICAR) 20 MG tablet Take 20 mg by mouth daily.       . ondansetron (ZOFRAN) 8 MG tablet Take 1 tablet (8 mg total) by mouth every 8 (eight) hours as needed for nausea.  20 tablet  2  . triamcinolone (NASACORT) 55 MCG/ACT nasal inhaler Place 2 sprays into the nose daily.       No current facility-administered medications for this encounter.    Physical Findings: The patient is in no  acute distress. Patient is alert and oriented.  height is 6\' 5"  (1.956 m) and weight is 278 lb (126.1 kg). His temperature is 97.7 F (36.5 C). His blood pressure is 113/70 and his pulse is 71. .  General: Alert and oriented, in no acute distress HEENT: Head is normocephalic. Pupils are equally round and reactive to light.  No scleral erythema or icterus. Oropharynx is clear. Heart: Regular in rate and rhythm with no murmurs, rubs, or gallops. Chest: Clear to auscultation bilaterally, with no rhonchi, wheezes, or rales. Abdomen: Soft, nontender, nondistended, with no rigidity or guarding. Extremities: No cyanosis or edema. Skin: Diffuse erythematous hue Musculoskeletal: symmetric strength and muscle tone throughout. Tenderness to palpation in the  left seventh posterior rib, underneath the scapula  Neurologic: Cranial nerves II through XII are grossly intact. No obvious focalities. Speech is fluent. Coordination is intact. Psychiatric: Judgment and insight are intact. Affect is appropriate.    Lab Findings: Lab Results  Component Value Date   WBC 3.7* 03/08/2013   HGB 16.4 03/08/2013   HCT 45.2 03/08/2013   MCV 89.1 03/08/2013   PLT 120* 03/08/2013    CMP     Component Value Date/Time   NA 139 03/08/2013 0953   NA 138 03/03/2013 1434   NA 138 05/20/2012 1105   K 3.9 03/08/2013 0953   K 4.5 03/03/2013 1434   K 4.5 05/20/2012 1105   CL 105 03/08/2013 0953   CL 101 03/03/2013 1434   CL 100 05/20/2012 1105   CO2 25 03/08/2013 0953   CO2 27 03/03/2013 1434   CO2 27 05/20/2012 1105   GLUCOSE 118* 03/08/2013 0953   GLUCOSE 88 03/03/2013 1434   GLUCOSE 110 05/20/2012 1105   BUN 15.7 03/08/2013 0953   BUN 16 03/03/2013 1434   BUN 15 05/20/2012 1105   CREATININE 1.1 03/08/2013 0953   CREATININE 1.05 03/03/2013 1434   CREATININE 1.0 05/20/2012 1105   CALCIUM 9.2 03/08/2013 0953   CALCIUM 9.4 03/03/2013 1434   CALCIUM 9.0 05/20/2012 1105   PROT 7.0 03/08/2013 0953   PROT 7.1 03/03/2013 1434   PROT 6.8 05/20/2012 1105   ALBUMIN 3.8 03/08/2013 0953   ALBUMIN 4.1 03/03/2013 1434   AST 32 03/08/2013 0953   AST 37 03/03/2013 1434   AST 30 05/20/2012 1105   ALT 56* 03/08/2013 0953   ALT 51 03/03/2013 1434   ALKPHOS 86 03/08/2013 0953   ALKPHOS 91 03/03/2013 1434   ALKPHOS 58 05/20/2012 1105   BILITOT 0.89 03/08/2013 0953   BILITOT 0.8 03/03/2013 1434   BILITOT 0.90 05/20/2012 1105   GFRNONAA 82* 03/03/2013 1434   GFRAA >90 03/03/2013 1434      Radiographic Findings: Dg Chest 2 View  03/03/2013   *RADIOLOGY REPORT*  Clinical Data: Left-sided chest pain for 3 months, no known injury  CHEST - 2 VIEW  Comparison: Prior chest x-ray 01/26/2013; prior PET CT 02/11/2012  Findings: Irregular appearing fracture of the posterolateral aspect of the left seventh rib.  There appears  to be increased lucency and rarefaction of the bone compared to the prior chest x-ray raising concern for an underlying aggressive lytic lesion.  Cardiac and mediastinal contours within normal limits.  No pneumothorax, pleural effusion or suspicious pulmonary nodule.  The lungs are clear.  IMPRESSION:  Probable destructive lytic lesion and associated pathologic fracture involving the posterolateral aspect of the left seventh rib.  Given the patient's history of prior right ethmoid sinus plasmacytoma, this is concerning for metastatic  plasmacytoma/multiple myeloma.  Consider further evaluation with dedicated non emergent chest CT or PET CT.   Original Report Authenticated By: Malachy Moan, M.D.   Ct Chest W Contrast  03/03/2013   *RADIOLOGY REPORT*  Clinical Data: Fall a few months ago with rib fracture on the left. New shortness of breath and left chest pain  CT CHEST WITH CONTRAST  Technique:  Multidetector CT imaging of the chest was performed following the standard protocol during bolus administration of intravenous contrast.  Contrast: 80mL OMNIPAQUE IOHEXOL 300 MG/ML  SOLN  Comparison: 02/11/2012 PET CT  Findings: Normal thyroid gland.  No evidence of mediastinal hemorrhage, adenopathy, or abnormal fluid.  No pericardial effusion.  No evidence of aortic injury.  Specifically no abrupt caliber change or intimal irregularity.  Mild atherosclerotic vascular calcifications of the aortic arch.  No pneumothorax or pleural effusion.  Clear lungs.  A pathologic left posterolateral 7th rib fracture is noted.  There is a soft tissue mass in the lytic process associated with the fractured ribs.  There is bony expansion of the lesion with cortical breakthrough.  No other obvious destructive bony lesions can be identified.  IMPRESSION: The fracture of the left seventh rib represents a pathologic fracture.  There is a destructive soft tissue mass at the site of the fracture worrisome for malignancy.  Otherwise, no  acute process in the thorax.  The patient does have a history of head and neck plasmacytoma.   Original Report Authenticated By: Jolaine Click, M.D.    Impression/Plan:  This is a lovely 52 year old gentleman with a history of an ethmoid sinus plasmocytoma. He completed definitive radiotherapy almost a year ago. Unfortunately, the new lesion in his rib cage, which I could not localize when I looked back at his PET scan from a year ago, is suspicious for malignancy. There is concern of metastatic multiple myeloma. Patient did however have some trauma when he fell on his left side a week before he developed pain in this area.  The results of his bone marrow biopsy are pending. PET scan and bone scan pending. I will go ahead and schedule a simulation take place today and start the treatment planning process in anticipation with him starting his radiotherapy next week. However, if the tests that have been ordered end up being negative, and there is a question of whether this is actually a lesion due to an injury rather than myeloma, we may consider a biopsy of the rib which before starting treatment. The patient and his wife are pleased with this plan.  Anticipate he will receive  25 gray in 10 fractions. Consent form was signed; most common side effects would be mild fatigue and skin irritation.    I spent 25 minutes minutes face to face with the patient and more than 50% of that time was spent in counseling and/or coordination of care. _____________________________________   Lonie Peak, MD

## 2013-03-14 NOTE — Progress Notes (Signed)
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  Sites of Visceral and Bony Metastatic Disease:  Pathological fracture of the left posterior lateral seventh rib with a soft tiisue mass and a lytic process associated with the fractured ribs  Patient presented Pain left ribs 3 months ago months ago  Biopsies of None (if applicable) revealed:None   Location(s) of Symptomatic Metastases: Left 7th rib  Past/Anticipated chemotherapy by medical oncology, if any: FU  Pain on a scale of 0-10 is: 2 on as scale of 0-10    If Spine Met(s), symptoms, if any, include:  Bowel/Bladder retention or incontinence (please describe): constipation  Numbness or weakness in extremities (please describe): No  Current Decadron regimen, if applicable: 4 mg po QID  Ambulatory status? Walker? Wheelchair?: ambulatory  SAFETY ISSUES:  Prior radiation? 04/08/12 - 50.4 gray in 28 fractions to the ethmoid sinuses  Pacemaker/ICD? NO  Possible current pregnancy? No  Is the patient on methotrexate? No  Current Complaints / other details:  Pain left ribs

## 2013-03-14 NOTE — Telephone Encounter (Signed)
s.w. pt wife and advised on MD visit being moved to 6.6.14...ok and aware

## 2013-03-14 NOTE — Progress Notes (Signed)
Mr. Bushey here for assessment of bone mets left 7 th seventh rib.

## 2013-03-15 ENCOUNTER — Encounter: Payer: Self-pay | Admitting: Radiation Oncology

## 2013-03-15 ENCOUNTER — Encounter (HOSPITAL_COMMUNITY)
Admission: RE | Admit: 2013-03-15 | Discharge: 2013-03-15 | Disposition: A | Discharge status: Home / Self Care | Payer: No Typology Code available for payment source | Source: Ambulatory Visit | Attending: Oncology | Admitting: Oncology

## 2013-03-15 ENCOUNTER — Other Ambulatory Visit (HOSPITAL_COMMUNITY): Payer: No Typology Code available for payment source

## 2013-03-15 ENCOUNTER — Other Ambulatory Visit: Payer: Self-pay | Admitting: Radiation Oncology

## 2013-03-15 DIAGNOSIS — D7289 Other specified disorders of white blood cells: Secondary | ICD-10-CM | POA: Insufficient documentation

## 2013-03-15 DIAGNOSIS — D729 Disorder of white blood cells, unspecified: Secondary | ICD-10-CM

## 2013-03-15 MED ORDER — TECHNETIUM TC 99M MEDRONATE IV KIT
25.0000 | PACK | Freq: Once | INTRAVENOUS | Status: AC | PRN
  Administered 2013-03-15: 25 via INTRAVENOUS

## 2013-03-15 NOTE — Progress Notes (Signed)
I spoke w/ With Dr. Gaylyn Rong this morning. The patient's bone marrow biopsy results were negative. I reviewed his bone scan which shows abnormal activity in the left posterior seventh rib and also a smaller lesion in the left posterior second rib. Hard to know for sure if this is definitely due to his cancer, there is a small possibility that these are due to trauma when the patient fell. I called the patient's wife and talk to her about these results. His PET scan is still pending for Friday. Dr. Gaylyn Rong & I agree that it would be prudent to get a biopsy of the patient's 7th rib lesion to verify if there is evidence of cancer in this lesion. There is of course the chance of a nondiagnostic biopsy. Until   those results come back we will place the patient's radiotherapy on hold. The patient's wife is very appreciative of this update and she will communicate this information to Mr. Austin Griffith.   ________________________________  Lonie Peak, MD

## 2013-03-16 ENCOUNTER — Encounter (HOSPITAL_COMMUNITY): Payer: Self-pay | Admitting: Pharmacy Technician

## 2013-03-16 ENCOUNTER — Ambulatory Visit: Payer: No Typology Code available for payment source | Admitting: Oncology

## 2013-03-16 ENCOUNTER — Other Ambulatory Visit: Payer: Self-pay | Admitting: Radiology

## 2013-03-16 ENCOUNTER — Encounter: Payer: Self-pay | Admitting: Oncology

## 2013-03-16 NOTE — Addendum Note (Signed)
Encounter addended by: Najla Aughenbaugh Mintz Nam Vossler, RN on: 03/16/2013  5:23 PM<BR>     Documentation filed: Charges VN

## 2013-03-17 ENCOUNTER — Ambulatory Visit (HOSPITAL_BASED_OUTPATIENT_CLINIC_OR_DEPARTMENT_OTHER): Payer: No Typology Code available for payment source | Admitting: Oncology

## 2013-03-17 ENCOUNTER — Telehealth: Payer: Self-pay | Admitting: Oncology

## 2013-03-17 ENCOUNTER — Encounter (HOSPITAL_COMMUNITY)
Admission: RE | Admit: 2013-03-17 | Discharge: 2013-03-17 | Disposition: A | Discharge status: Home / Self Care | Payer: No Typology Code available for payment source | Source: Ambulatory Visit | Attending: Oncology | Admitting: Oncology

## 2013-03-17 ENCOUNTER — Encounter (HOSPITAL_COMMUNITY): Payer: Self-pay

## 2013-03-17 VITALS — BP 111/58 | HR 68 | Temp 97.9°F | Resp 18 | Ht 77.0 in | Wt 274.1 lb

## 2013-03-17 DIAGNOSIS — C888 Other malignant immunoproliferative diseases: Secondary | ICD-10-CM

## 2013-03-17 DIAGNOSIS — D7289 Other specified disorders of white blood cells: Secondary | ICD-10-CM | POA: Insufficient documentation

## 2013-03-17 DIAGNOSIS — K8689 Other specified diseases of pancreas: Secondary | ICD-10-CM

## 2013-03-17 DIAGNOSIS — C9000 Multiple myeloma not having achieved remission: Secondary | ICD-10-CM

## 2013-03-17 DIAGNOSIS — K869 Disease of pancreas, unspecified: Secondary | ICD-10-CM

## 2013-03-17 DIAGNOSIS — R160 Hepatomegaly, not elsewhere classified: Secondary | ICD-10-CM

## 2013-03-17 DIAGNOSIS — K5900 Constipation, unspecified: Secondary | ICD-10-CM

## 2013-03-17 DIAGNOSIS — G4700 Insomnia, unspecified: Secondary | ICD-10-CM

## 2013-03-17 DIAGNOSIS — D729 Disorder of white blood cells, unspecified: Secondary | ICD-10-CM

## 2013-03-17 LAB — UIFE/LIGHT CHAINS/TP QN, 24-HR UR
Albumin, U: DETECTED
Alpha 1, Urine: DETECTED — AB
Alpha 2, Urine: DETECTED — AB
Beta, Urine: DETECTED — AB
Free Kappa/Lambda Ratio: 205.65 ratio — ABNORMAL HIGH (ref 2.04–10.37)
Free Lt Chn Excr Rate: 946 mg/d
Total Protein, Urine-Ur/day: 966 mg/d — ABNORMAL HIGH (ref 10–140)
Total Protein, Urine: 48.3 mg/dL

## 2013-03-17 MED ORDER — FLUDEOXYGLUCOSE F - 18 (FDG) INJECTION
19.3000 | Freq: Once | INTRAVENOUS | Status: AC | PRN
  Administered 2013-03-17: 19.3 via INTRAVENOUS

## 2013-03-17 MED ORDER — LACTULOSE 10 GM/15ML PO SOLN: 30.0000 g | Freq: Three times a day (TID) | ORAL | Status: DC | PRN

## 2013-03-17 NOTE — Progress Notes (Signed)
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   PAST THERAPY: He completed 50.4 gray in 28 fractions on 04/08/2012  CURRENT THERAPY: discussing therapy for multiple plasmacytoma.   INTERVAL HISTORY: Austin Griffith 52 y.o. male returns for regular follow up with his wife.  With Dexamethasone, his left upper chest and left flank has have significantly improved.  He has been able to function, and be supine now which he could not do last week.  He has insomnia.  He still takes MS Contin 15mg  PO BID and Vicodin for break through pain about 3x/day.  He has not had a bowel movement for one week despite taking stool softener, and laxative Miralax.  He has not taken enema or Lactulose.  He has good appetite.  He feels easily agitated being on Dexamethasone.  He denied vertebral spine pain, lower extremity weakness, bleeding symptoms, paresthesia, bowel/bladder incontinence.    Past Medical History  Diagnosis Date  . Hypertension   . Plasmacytoma      Right Ethmoid Sinus; bone marrow biopsy on 03/10/13 showed normal myeloma FISH panel.   . Hyperlipidemia     diet control   . AVN (avascular necrosis of bone)     bilateral hips; s/p hip replacement in 1991 and 2009  . Headache(784.0) 02/08/2012    Right Sided Headache Behind Right Eye  . Epistaxis 02/08/2012  . Blurred vision     Present for "many years"  . Fatigue   . Acid reflux   . History of radiation therapy 02/29/12-04/08/12    right  ethmoid sinus in 50.4Gy in 20 fxs  . Fatigue 05/25/12    "Mild"  . Thrombocytopenia 05/25/12    Past Surgical History  Procedure Laterality Date  . Bilateral hip replacement  1991; and 2009    due to bilateral hip AVN's.   . Mass biopsy  01/26/12    Right Ethmoid Mass - Plasma Cell Neoplasm     Current Outpatient Prescriptions  Medication Sig Dispense Refill  . cycloSPORINE (RESTASIS) 0.05 % ophthalmic emulsion Place 1 drop into both eyes 2 (two) times daily.      Marland Kitchen dexamethasone (DECADRON) 4 MG tablet Take 1 tablet (4 mg total) by mouth every 6 (six) hours.  120 tablet  0  . escitalopram (LEXAPRO) 20 MG tablet Take 10 mg by mouth daily.      Marland Kitchen HYDROcodone-acetaminophen (NORCO) 7.5-325 MG per tablet Take 1 tablet by mouth every 6 (six) hours as needed for pain.  60 tablet  0  . ibuprofen (ADVIL,MOTRIN) 200 MG tablet Take 600 mg by mouth every 6 (six) hours as needed for pain.      Marland Kitchen lansoprazole (PREVACID) 15 MG capsule Take 15 mg by mouth daily.      Marland Kitchen LORazepam (ATIVAN) 1 MG tablet Take 1 mg by mouth every 8 (eight) hours as needed for anxiety.       Marland Kitchen morphine (MS CONTIN) 15 MG 12 hr tablet Take 1 tablet (15 mg total) by mouth 2 (two) times daily.  60 tablet  0  . naproxen sodium (ALEVE) 220 MG tablet Take 220 mg by mouth 2 (two) times daily as needed (Pain).      Marland Kitchen olmesartan (BENICAR) 20 MG tablet Take 20 mg by mouth every morning.       Marland Kitchen  ondansetron (ZOFRAN) 8 MG tablet Take 1 tablet (8 mg total) by mouth every 8 (eight) hours as needed for nausea.  20 tablet  2  . triamcinolone (NASACORT) 55 MCG/ACT nasal inhaler Place 2 sprays into the nose daily.      Marland Kitchen lactulose (CHRONULAC) 10 GM/15ML solution Take 45 mLs (30 g total) by mouth 3 (three) times daily as needed (severe constipation.).  240 mL  0   No current facility-administered medications for this visit.    ALLERGIES:  has No Known Allergies.  REVIEW OF SYSTEMS:  The rest of the 14-point review of system was negative.   Filed Vitals:   03/17/13 1558  BP: 111/58  Pulse: 68  Temp: 97.9 F (36.6 C)  Resp: 18   Wt Readings from Last 3 Encounters:  03/17/13 274 lb 1.6 oz (124.331 kg)  03/10/13 273 lb 11.2 oz (124.15 kg)  03/03/13 275 lb (124.739 kg)   ECOG Performance status: 0-1  PHYSICAL EXAMINATION:    General:  well-nourished man, in no acute distress.  Eyes:  no scleral icterus.  ENT:  There were no oropharyngeal lesions.  Neck was without thyromegaly.  Lymphatics:  Negative cervical, supraclavicular or axillary adenopathy.  Respiratory: lungs were clear bilaterally without wheezing or crackles.  Cardiovascular:  Regular rate and rhythm, S1/S2, without murmur, rub or gallop.  There was no pedal edema.  GI:  abdomen was soft, flat, nontender, nondistended, without organomegaly.  Muscoloskeletal:  no spinal tenderness of palpation of vertebral spine.  Skin exam was without echymosis, petichae.  Neuro exam was nonfocal.  Patient was able to get on and off exam table without assistance.  Gait was normal.  Patient was alert and oriented.  Attention was good.   Language was appropriate.  Mood was normal without depression.  Speech was not pressured.  Thought content was not tangential.       LABORATORY/RADIOLOGY DATA:  Lab Results  Component Value Date   WBC 3.7* 03/08/2013   HGB 16.4 03/08/2013   HCT 45.2 03/08/2013   PLT 120* 03/08/2013   GLUCOSE 118* 03/08/2013   ALKPHOS 86 03/08/2013   ALT 56* 03/08/2013   AST 32 03/08/2013   NA 139 03/08/2013   K 3.9 03/08/2013   CL 105 03/08/2013   CREATININE 1.1 03/08/2013   BUN 15.7 03/08/2013   CO2 25 03/08/2013   Blood kappa:lamdba ratio was 21.12 on 03/08/13.   Bone marrow biopsy on 03/10/13 showed only 2% plasma cell.  24hour urine collection showed monoclonal free kappa light chain (with ratio kappa:lambda at 205 (normal raditio 2.04-10.37).     RADIOLOGY:  I personally reviewed the following studies and showed them the images from PET scan.  Dg Chest 2 View  03/03/2013   *RADIOLOGY REPORT*  Clinical Data: Left-sided chest pain for 3 months, no known injury  CHEST - 2 VIEW  Comparison: Prior chest x-ray 01/26/2013; prior PET CT 02/11/2012  Findings: Irregular appearing fracture of the posterolateral aspect of the left seventh rib.  There appears to  be increased lucency and rarefaction of the bone compared to the prior chest x-ray raising concern for an underlying aggressive lytic lesion.  Cardiac and mediastinal contours within normal limits.  No pneumothorax, pleural effusion or suspicious pulmonary nodule.  The lungs are clear.  IMPRESSION:  Probable destructive lytic lesion and associated pathologic fracture involving the posterolateral aspect of the left seventh rib.  Given the patient's history of prior right ethmoid sinus plasmacytoma, this is concerning for metastatic  plasmacytoma/multiple myeloma.  Consider further evaluation with dedicated non emergent chest CT or PET CT.   Original Report Authenticated By: Malachy Moan, M.D.   Ct Chest W Contrast  03/03/2013   *RADIOLOGY REPORT*  Clinical Data: Fall a few months ago with rib fracture on the left. New shortness of breath and left chest pain  CT CHEST WITH CONTRAST  Technique:  Multidetector CT imaging of the chest was performed following the standard protocol during bolus administration of intravenous contrast.  Contrast: 80mL OMNIPAQUE IOHEXOL 300 MG/ML  SOLN  Comparison: 02/11/2012 PET CT  Findings: Normal thyroid gland.  No evidence of mediastinal hemorrhage, adenopathy, or abnormal fluid.  No pericardial effusion.  No evidence of aortic injury.  Specifically no abrupt caliber change or intimal irregularity.  Mild atherosclerotic vascular calcifications of the aortic arch.  No pneumothorax or pleural effusion.  Clear lungs.  A pathologic left posterolateral 7th rib fracture is noted.  There is a soft tissue mass in the lytic process associated with the fractured ribs.  There is bony expansion of the lesion with cortical breakthrough.  No other obvious destructive bony lesions can be identified.  IMPRESSION: The fracture of the left seventh rib represents a pathologic fracture.  There is a destructive soft tissue mass at the site of the fracture worrisome for malignancy.  Otherwise, no acute  process in the thorax.  The patient does have a history of head and neck plasmacytoma.   Original Report Authenticated By: Jolaine Click, M.D.   Nm Bone Scan Whole Body  03/15/2013   *RADIOLOGY REPORT*  Clinical Data: Plasmacytoma  NUCLEAR MEDICINE WHOLE BODY BONE SCINTIGRAPHY  Technique:  Whole body anterior and posterior images were obtained approximately 3 hours after intravenous injection of radiopharmaceutical.  Radiopharmaceutical: CURIE TC-MDP TECHNETIUM TC 67M MEDRONATE IV KIT  Comparison: 03/02/2012  Findings: Large focus of intense radiotracer uptake localizes to the approximate left seventh rib posteriorly.  A smaller focus of increased uptake localizes to the posterior aspect of the left second rib.  Corresponding lytic lesions are identified on CT from 03/03/2013.  No additional foci of increased uptake identified within the axial or appendicular skeleton. Normal physiologic tracer activity noted within the kidneys and urinary bladder.  IMPRESSION:  1.  Abnormal uptake localizes to the left posterior ribs consistent with multifocal plasmocytomas.   Original Report Authenticated By: Signa Kell, M.D.   Nm Pet Image Restag (ps) Skull Base To Thigh  03/17/2013   *RADIOLOGY REPORT*  Clinical Data: Subsequent treatment strategy for plasmacytoma. New lytic rib lesions.  NUCLEAR MEDICINE PET SKULL BASE TO THIGH  Fasting Blood Glucose:  100  Technique:  19.3 mCi F-18 FDG was injected intravenously. CT data was obtained and used for attenuation correction and anatomic localization only.  (This was not acquired as a diagnostic CT examination.) Additional exam technical data entered on technologist worksheet.  Comparison:  Chest CT 03/03/2013 and nuclear medicine whole-body bone scan 03/15/2013.  PET CT 02/11/2012.  Findings:  Neck: I do not see any residual metabolically active tumor in the region of the sphenoid sinus and I do not see any definite destructive bone lesions involving the clivus/skull  base.  No neck adenopathy.  Chest:  No hypermetabolic mediastinal or hilar nodes.  No suspicious pulmonary nodules on the CT scan.  Abdomen/Pelvis:  There are two lesions in the pancreas.  One is in the head and one is in the tail and splenic hilum.  These demonstrate FDG uptake with SUV max of  13.8 in the tail of the pancreas and 7.8 in the head of the pancreas.  Three liver lesions are also suspected.  These  have SUV max of 7.8.  MRI abdomen without and with contrast is suggested for further evaluation.  No adenopathy.  Skeleton:  There are numerous lytic myelomatous bone lesions involving thoracic and lumbar vertebral bodies, left sided ribs the left iliac bone.  No spinal canal compromise.  IMPRESSION:  1.  Numerous lytic myelomatous bone lesions involving the spine, left ribs and left iliac bone. 2.  Two pancreatic lesions and possibly three liver lesions suspicious for metastatic disease.  MRI abdomen without and with contrast may be helpful for further evaluation. 3.  No residual or recurrent tumor in the sphenoid sinus or skull base.   Original Report Authenticated By: Rudie Meyer, M.D.     ASSESSMENT AND PLAN:   1.  Diagnosis:  Multiple plasmacytoma (was solitary last year).  No evidence of multiple myeloma (meaning it's not in the bone marrow, nor is it causing anemia, kidney problem, or high calcium).  Work up:  I do recommend rib biopsy to prove plasmacytoma since we only have suggestion of plasma cell disorder on blood work and UPEP.   1a.  Pancreas mass:  Most likely plasmacytoma.  However, we do not want to miss pancreas cancer.  I strongly recommend referral to GI for ERCP to biopsy the pancreas mass.   2.  Treatment:    * Local therapy with radiation to the spots with most pain.    * Start chemo therapy after finish radiation followed by bone marrow transplant.  Without treatment with chemo, plasmacytoma can spread to many other organs causing symptoms in the future.   3.  Options  for chemo:  *  Oral + SQ chemo:  Revlimid (days 1-21); Velcade SQ once a week (3 weeks on, 1 week off);  Dexamethasone (once a week); every 4 week-cycle.  *  Clinical trial CTSU E1A11:  Same regimen as above except for randomization between Velcade and carfilzomib.  Carfilzomib is very effective in patients with relapsed myeloma.  It is not approved for front line therapy.  The clinical trial looks at whether Velcade or carfilzomib is better in the frontline setting.   *  In patients who are reasonably in good health, we may consider autologous bone marrow transplant to improve chance of cure. Evaluation by [facility] Dr. Donnie Coffin within 1-2 weeks for opinion regarding chemo and bone marrow transplant.   4.  Potential side effects of these chemo:  Low blood count, bleeding, infection, neuropathy, skin rash, blood clot.   5.  Chance of response:  Upward to 70-80% depending on risk stratification.    6.  How to follow response:   serum light chain should decrease with chemo.    7.  For constipation:  I advised him to take Lactulose which I prescribed today in addition to enema.    8.  For pain control:  I advised him to stop MS contin and only take Vicodin prn every 6-8 hours.  I advised him to taper Dexamethasone 4mg  PO to only BID (from TID).   9.   Follow up:  In about 7-10 days to discuss result of rib biopsy for proof of plasmacytoma and start therapy.      The length of time of the face-to-face encounter was 45 minutes. More than 50% of time was spent counseling and coordination of care.  Burney Calzadilla T. Gaylyn Rong, M.D.

## 2013-03-17 NOTE — Patient Instructions (Addendum)
1.  Diagnosis:  Multiple plasmacytoma (was solitary last year).  No evidence of multiple myeloma (meaning it's not in the bone marrow, nor is it causing anemia, kidney problem, or high calcium)  1a.  Pancreas mass:  Most likely plasmacytoma.  However, we do not want to miss pancreas cancer.  I strongly recommend referral to GI for ERCP to biopsy the pancreas mass.   2.  Treatment:    * Local therapy with radiation to the spots with most pain.  * Start chemo therapy after finish radiation followed by bone marrow transplant.  Without treatment with chemo, plasmacytoma can spread to many other organs causing symptoms in the future.   3.  Options for chemo:  *  Oral + SQ chemo:  Revlimid (days 1-21); Velcade SQ once a week (3 weeks on, 1 week off);  Dexamethasone (once a week); every 4 week-cycle.  *  Clinical trial CTSU E1A11:  Same regimen as above except for randomization between Velcade and carfilzomib.  Carfilzomib is very effective in patients with relapsed myeloma.  It is not approved for front line therapy.  The clinical trial looks at whether Velcade or carfilzomib is better in the frontline setting.   *  In patients who are reasonably in good health, we may consider autologous bone marrow transplant to improve chance of cure. Evaluation by [facility] Dr. Donnie Coffin within 1-2 weeks for opinion regarding chemo and bone marrow transplant.   4.  Potential side effects of these chemo:  Low blood count, bleeding, infection, neuropathy, skin rash, blood clot.   5.  Chance of response:  Upward to 70-80% depending on risk stratification.    6.  How to follow response:   serum light chain should decrease with chemo.

## 2013-03-17 NOTE — Telephone Encounter (Signed)
Gave pt appt for lab and MD on 6/13 , called Tina @ IR for U/S biopsy, will called Dr. Elnoria Howard on Monday and gave referral to [facility] to for Dr. Barbaraann Boys

## 2013-03-20 ENCOUNTER — Telehealth: Payer: Self-pay | Admitting: Oncology

## 2013-03-20 LAB — TISSUE HYBRIDIZATION (BONE MARROW)-NCBH

## 2013-03-20 NOTE — Telephone Encounter (Signed)
Gave pt appt for for Dr. Elnoria Howard on 03/22/13 2:45pm

## 2013-03-20 NOTE — Telephone Encounter (Signed)
Faxed pt medical records to Dr. Jeani Hawking

## 2013-03-21 ENCOUNTER — Ambulatory Visit
Admission: RE | Admit: 2013-03-21 | Payer: No Typology Code available for payment source | Source: Ambulatory Visit | Admitting: Radiation Oncology

## 2013-03-21 ENCOUNTER — Ambulatory Visit (HOSPITAL_COMMUNITY)
Admission: RE | Admit: 2013-03-21 | Discharge: 2013-03-21 | Disposition: A | Discharge status: Home / Self Care | Payer: No Typology Code available for payment source | Source: Ambulatory Visit | Attending: Radiation Oncology | Admitting: Radiation Oncology

## 2013-03-21 ENCOUNTER — Encounter (HOSPITAL_COMMUNITY): Payer: Self-pay

## 2013-03-21 DIAGNOSIS — M899 Disorder of bone, unspecified: Secondary | ICD-10-CM | POA: Insufficient documentation

## 2013-03-21 DIAGNOSIS — M949 Disorder of cartilage, unspecified: Secondary | ICD-10-CM | POA: Insufficient documentation

## 2013-03-21 DIAGNOSIS — I10 Essential (primary) hypertension: Secondary | ICD-10-CM | POA: Insufficient documentation

## 2013-03-21 DIAGNOSIS — D729 Disorder of white blood cells, unspecified: Secondary | ICD-10-CM

## 2013-03-21 DIAGNOSIS — C9000 Multiple myeloma not having achieved remission: Secondary | ICD-10-CM | POA: Insufficient documentation

## 2013-03-21 DIAGNOSIS — E785 Hyperlipidemia, unspecified: Secondary | ICD-10-CM | POA: Insufficient documentation

## 2013-03-21 LAB — CBC
HCT: 41.4 % (ref 39.0–52.0)
Hemoglobin: 14.8 g/dL (ref 13.0–17.0)
MCH: 30.9 pg (ref 26.0–34.0)
MCHC: 35.7 g/dL (ref 30.0–36.0)

## 2013-03-21 MED ORDER — SODIUM CHLORIDE 0.9 % IV SOLN: INTRAVENOUS | Status: DC

## 2013-03-21 MED ORDER — MIDAZOLAM HCL 2 MG/2ML IJ SOLN
INTRAMUSCULAR | Status: AC | PRN
  Administered 2013-03-21: 1 mg via INTRAVENOUS

## 2013-03-21 MED ORDER — MIDAZOLAM HCL 2 MG/2ML IJ SOLN
INTRAMUSCULAR | Status: AC
  Filled 2013-03-21: qty 6

## 2013-03-21 MED ORDER — FENTANYL CITRATE 0.05 MG/ML IJ SOLN
INTRAMUSCULAR | Status: AC
  Filled 2013-03-21: qty 6

## 2013-03-21 MED ORDER — HYDROCODONE-ACETAMINOPHEN 5-325 MG PO TABS
1.0000 | ORAL_TABLET | ORAL | Status: DC | PRN
  Filled 2013-03-21: qty 2

## 2013-03-21 MED ORDER — MIDAZOLAM HCL 5 MG/5ML IJ SOLN
INTRAMUSCULAR | Status: AC | PRN
  Administered 2013-03-21: 1 mg via INTRAVENOUS

## 2013-03-21 MED ORDER — FENTANYL CITRATE 0.05 MG/ML IJ SOLN
INTRAMUSCULAR | Status: AC | PRN
  Administered 2013-03-21: 100 ug via INTRAVENOUS

## 2013-03-21 NOTE — H&P (Signed)
Austin Griffith is an 52 y.o. male.   Chief Complaint: "I'm having a rib biopsy" HPI: Patient with history of solitary plasmacytoma of right ethmoid sinus, now with multiple plasmacytoma and hypermetabolic bony lesions on recent PET presents today for CT guided left posterior seventh rib biopsy.  Past Medical History  Diagnosis Date  . Hypertension   . Plasmacytoma      Right Ethmoid Sinus; bone marrow biopsy on 03/10/13 showed normal myeloma FISH panel.   . Hyperlipidemia     diet control   . AVN (avascular necrosis of bone)     bilateral hips; s/p hip replacement in 1991 and 2009  . Headache(784.0) 02/08/2012    Right Sided Headache Behind Right Eye  . Epistaxis 02/08/2012  . Blurred vision     Present for "many years"  . Fatigue   . Acid reflux   . History of radiation therapy 02/29/12-04/08/12    right  ethmoid sinus in 50.4Gy in 20 fxs  . Fatigue 05/25/12    "Mild"  . Thrombocytopenia 05/25/12    Past Surgical History  Procedure Laterality Date  . Bilateral hip replacement  1991; and 2009    due to bilateral hip AVN's.   . Mass biopsy  01/26/12    Right Ethmoid Mass - Plasma Cell Neoplasm    Family History  Problem Relation Age of Onset  . Adopted: Yes   Social History:  reports that he has never smoked. He has never used smokeless tobacco. He reports that he does not drink alcohol or use illicit drugs.  Allergies: No Known Allergies  Current outpatient prescriptions:cycloSPORINE (RESTASIS) 0.05 % ophthalmic emulsion, Place 1 drop into both eyes 2 (two) times daily., Disp: , Rfl: ;  dexamethasone (DECADRON) 4 MG tablet, Take 1 tablet (4 mg total) by mouth every 6 (six) hours., Disp: 120 tablet, Rfl: 0;  escitalopram (LEXAPRO) 20 MG tablet, Take 10 mg by mouth daily., Disp: , Rfl:  HYDROcodone-acetaminophen (NORCO) 7.5-325 MG per tablet, Take 1 tablet by mouth every 6 (six) hours as needed for pain., Disp: 60 tablet, Rfl: 0;  ibuprofen (ADVIL,MOTRIN) 200 MG tablet, Take 600  mg by mouth every 6 (six) hours as needed for pain., Disp: , Rfl: ;  lansoprazole (PREVACID) 15 MG capsule, Take 15 mg by mouth daily., Disp: , Rfl:  LORazepam (ATIVAN) 1 MG tablet, Take 1 mg by mouth every 8 (eight) hours as needed for anxiety. , Disp: , Rfl: ;  olmesartan (BENICAR) 20 MG tablet, Take 20 mg by mouth every morning. , Disp: , Rfl: ;  triamcinolone (NASACORT) 55 MCG/ACT nasal inhaler, Place 2 sprays into the nose daily., Disp: , Rfl:  lactulose (CHRONULAC) 10 GM/15ML solution, Take 45 mLs (30 g total) by mouth 3 (three) times daily as needed (severe constipation.)., Disp: 240 mL, Rfl: 0;  morphine (MS CONTIN) 15 MG 12 hr tablet, Take 1 tablet (15 mg total) by mouth 2 (two) times daily., Disp: 60 tablet, Rfl: 0;  naproxen sodium (ALEVE) 220 MG tablet, Take 220 mg by mouth 2 (two) times daily as needed (Pain)., Disp: , Rfl:  ondansetron (ZOFRAN) 8 MG tablet, Take 1 tablet (8 mg total) by mouth every 8 (eight) hours as needed for nausea., Disp: 20 tablet, Rfl: 2 Current facility-administered medications:0.9 %  sodium chloride infusion, , Intravenous, Continuous, Brayton El, PA-C Facility-Administered Medications Ordered in Other Encounters: fentaNYL (SUBLIMAZE) 0.05 MG/ML injection, , , ,  No results found. 03/21/13 LABS PENDING Review of Systems  Constitutional:  Negative for fever and chills.  Respiratory: Negative for cough and shortness of breath.   Cardiovascular: Negative for chest pain.  Gastrointestinal: Negative for nausea, vomiting and abdominal pain.  Musculoskeletal: Negative for back pain.  Neurological: Negative for headaches.  Endo/Heme/Allergies: Does not bruise/bleed easily.   Vitals:  BP 133/79  HR 81  R 18  TEMP 98.4  O2 SATS 99%RA Physical Exam  Constitutional: He is oriented to person, place, and time. He appears well-developed and well-nourished.  Cardiovascular: Normal rate and regular rhythm.   Respiratory: Effort normal and breath sounds normal.  GI: Soft.  Bowel sounds are normal. There is no tenderness.  Musculoskeletal: Normal range of motion. He exhibits no edema.  Neurological: He is alert and oriented to person, place, and time.     Assessment/Plan Pt with hx multiple plasmacytoma and hypermetabolic bony lesions on recent PET. Plan is for CT guided left posterior seventh rib lesion biopsy today. Details/risks of procedure d/w pt/family with their understanding and consent.  Arlester Keehan,D KEVIN 03/21/2013, 9:58 AM

## 2013-03-21 NOTE — Procedures (Signed)
Successful CT guided LT 7TH RIB LESION CORE BX NO COMP STABLE FULL REPORT IN PACS

## 2013-03-22 ENCOUNTER — Encounter: Payer: Self-pay | Admitting: Oncology

## 2013-03-22 ENCOUNTER — Ambulatory Visit: Payer: No Typology Code available for payment source

## 2013-03-22 ENCOUNTER — Telehealth: Payer: Self-pay | Admitting: Oncology

## 2013-03-22 ENCOUNTER — Other Ambulatory Visit: Payer: Self-pay | Admitting: Oncology

## 2013-03-22 ENCOUNTER — Other Ambulatory Visit: Payer: Self-pay | Admitting: Gastroenterology

## 2013-03-22 DIAGNOSIS — R160 Hepatomegaly, not elsewhere classified: Secondary | ICD-10-CM

## 2013-03-22 DIAGNOSIS — K8689 Other specified diseases of pancreas: Secondary | ICD-10-CM

## 2013-03-22 NOTE — Telephone Encounter (Signed)
Talkedn to pt and gave her appt for lab and MD on 6/19 per MD appt with MD moved from 6/13 to 6/19 due to Biopsy

## 2013-03-23 ENCOUNTER — Ambulatory Visit: Payer: No Typology Code available for payment source

## 2013-03-24 ENCOUNTER — Encounter (HOSPITAL_COMMUNITY): Payer: Self-pay

## 2013-03-24 ENCOUNTER — Ambulatory Visit (HOSPITAL_COMMUNITY)
Admission: RE | Admit: 2013-03-24 | Discharge: 2013-03-24 | Disposition: A | Discharge status: Home / Self Care | Payer: No Typology Code available for payment source | Source: Ambulatory Visit | Attending: Gastroenterology | Admitting: Gastroenterology

## 2013-03-24 ENCOUNTER — Encounter (HOSPITAL_COMMUNITY)
Admission: RE | Disposition: A | Discharge status: Home / Self Care | Payer: Self-pay | Source: Ambulatory Visit | Attending: Gastroenterology

## 2013-03-24 ENCOUNTER — Ambulatory Visit: Payer: No Typology Code available for payment source | Admitting: Oncology

## 2013-03-24 ENCOUNTER — Ambulatory Visit: Payer: No Typology Code available for payment source

## 2013-03-24 ENCOUNTER — Other Ambulatory Visit (HOSPITAL_COMMUNITY): Payer: No Typology Code available for payment source

## 2013-03-24 ENCOUNTER — Other Ambulatory Visit: Payer: No Typology Code available for payment source | Admitting: Lab

## 2013-03-24 DIAGNOSIS — M949 Disorder of cartilage, unspecified: Secondary | ICD-10-CM | POA: Insufficient documentation

## 2013-03-24 DIAGNOSIS — K219 Gastro-esophageal reflux disease without esophagitis: Secondary | ICD-10-CM | POA: Insufficient documentation

## 2013-03-24 DIAGNOSIS — C9030 Solitary plasmacytoma not having achieved remission: Secondary | ICD-10-CM | POA: Insufficient documentation

## 2013-03-24 DIAGNOSIS — R948 Abnormal results of function studies of other organs and systems: Secondary | ICD-10-CM | POA: Insufficient documentation

## 2013-03-24 DIAGNOSIS — Z79899 Other long term (current) drug therapy: Secondary | ICD-10-CM | POA: Insufficient documentation

## 2013-03-24 DIAGNOSIS — Z96649 Presence of unspecified artificial hip joint: Secondary | ICD-10-CM | POA: Insufficient documentation

## 2013-03-24 DIAGNOSIS — K5900 Constipation, unspecified: Secondary | ICD-10-CM | POA: Insufficient documentation

## 2013-03-24 DIAGNOSIS — I10 Essential (primary) hypertension: Secondary | ICD-10-CM | POA: Insufficient documentation

## 2013-03-24 DIAGNOSIS — M899 Disorder of bone, unspecified: Secondary | ICD-10-CM | POA: Insufficient documentation

## 2013-03-24 DIAGNOSIS — E785 Hyperlipidemia, unspecified: Secondary | ICD-10-CM | POA: Insufficient documentation

## 2013-03-24 DIAGNOSIS — G4700 Insomnia, unspecified: Secondary | ICD-10-CM | POA: Insufficient documentation

## 2013-03-24 DIAGNOSIS — K869 Disease of pancreas, unspecified: Secondary | ICD-10-CM | POA: Insufficient documentation

## 2013-03-24 HISTORY — PX: FINE NEEDLE ASPIRATION: SHX5430

## 2013-03-24 HISTORY — PX: EUS: SHX5427

## 2013-03-24 SURGERY — UPPER ENDOSCOPIC ULTRASOUND (EUS) LINEAR
Anesthesia: Moderate Sedation

## 2013-03-24 MED ORDER — FENTANYL CITRATE 0.05 MG/ML IJ SOLN
INTRAMUSCULAR | Status: AC
  Filled 2013-03-24: qty 2

## 2013-03-24 MED ORDER — MIDAZOLAM HCL 10 MG/2ML IJ SOLN
INTRAMUSCULAR | Status: DC | PRN
  Administered 2013-03-24: 2 mg via INTRAVENOUS
  Administered 2013-03-24 (×2): 1 mg via INTRAVENOUS
  Administered 2013-03-24: 2 mg via INTRAVENOUS

## 2013-03-24 MED ORDER — DIPHENHYDRAMINE HCL 50 MG/ML IJ SOLN
INTRAMUSCULAR | Status: AC
  Filled 2013-03-24: qty 1

## 2013-03-24 MED ORDER — MIDAZOLAM HCL 10 MG/2ML IJ SOLN
INTRAMUSCULAR | Status: AC
  Filled 2013-03-24: qty 2

## 2013-03-24 MED ORDER — DIPHENHYDRAMINE HCL 50 MG/ML IJ SOLN
INTRAMUSCULAR | Status: DC | PRN
  Administered 2013-03-24: 25 mg via INTRAVENOUS

## 2013-03-24 MED ORDER — SODIUM CHLORIDE 0.9 % IV SOLN
INTRAVENOUS | Status: DC
  Administered 2013-03-24: 500 mL via INTRAVENOUS

## 2013-03-24 MED ORDER — FENTANYL CITRATE 0.05 MG/ML IJ SOLN
INTRAMUSCULAR | Status: DC | PRN
  Administered 2013-03-24 (×3): 25 ug via INTRAVENOUS

## 2013-03-24 NOTE — Op Note (Signed)
[facility] [address] [place], [address]   ENDOSCOPIC ULTRASOUND PROCEDURE REPORT  PATIENT: Austin, Griffith  MR#: [id number] BIRTHDATE: Nov 21, 1960  GENDER: Male ENDOSCOPIST: Jeani Hawking, MD REFERRED BY: PROCEDURE DATE:  03/24/2013 PROCEDURE:   Upper EUS w/FNA ASA CLASS:      Class III INDICATIONS:   1.  abnormal PET scan of the GI tract. MEDICATIONS: Versed 6 mg IV, Fentanyl 75 mcg IV, and Benadryl 25 mg   DESCRIPTION OF PROCEDURE:   After the risks benefits and alternatives of the procedure were  explained, informed consent was obtained. The patient was then placed in the left, lateral, decubitus postion and IV sedation was administered. Throughout the procedure, the patients blood pressure, pulse and oxygen saturations were monitored continuously.  Under direct visualization, the     endoscope was introduced through the mouth and advanced to the second portion of the duodenum .  Water was used as necessary to provide an acoustic interface.  Upon completion of the imaging, water was removed and the patient was sent to the recovery room in satisfactory condition.      FINDINGS:  The body and tail of the pancreas were difficult to identify, but with tracing the vasculature towards the tail of the pancreas an isoechoic lesions with a hyperechoic rim was identified.  The lesion measured 10 mm  x 16 mm and three passes with the 25 gauge FNA needle was performed.  A second head of the pancreas lesion was identified.  It was hypoechoic, irregular measuring 15 mm x 15 mm, approximately.  Four passes with a 22 gauge needle was performed.   The scope was then withdrawn from the patient and the procedure completed.  COMPLICATIONS: There were no complications. ENDOSCOPIC VISUALIZATION:  ULTRASONIC VISUALIZATION:  STAGING:  ENDOSCOPIC IMPRESSION: 1) Isoechoic 10 mm x 16 mm tail of the pancreas mass. 2) Hypoechoic, irregular 15 mm x 15 mm head of the  pancreas mass.  RECOMMENDATIONS: 1) Await FNA results.  _______________________________ eSignedJeani Hawking, MD 03/24/2013 10:05 AM   CC:

## 2013-03-24 NOTE — H&P (View-Only) (Signed)
 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   PAST THERAPY: He completed 50.4 gray in 28 fractions on 04/08/2012  CURRENT THERAPY: discussing therapy for multiple plasmacytoma.   INTERVAL HISTORY: Austin Griffith 51 y.o. male returns for regular follow up with his wife.  With Dexamethasone, his left upper chest and left flank has have significantly improved.  He has been able to function, and be supine now which he could not do last week.  He has insomnia.  He still takes MS Contin 15mg PO BID and Vicodin for break through pain about 3x/day.  He has not had a bowel movement for one week despite taking stool softener, and laxative Miralax.  He has not taken enema or Lactulose.  He has good appetite.  He feels easily agitated being on Dexamethasone.  He denied vertebral spine pain, lower extremity weakness, bleeding symptoms, paresthesia, bowel/bladder incontinence.    Past Medical History  Diagnosis Date  . Hypertension   . Plasmacytoma      Right Ethmoid Sinus; bone marrow biopsy on 03/10/13 showed normal myeloma FISH panel.   . Hyperlipidemia     diet control   . AVN (avascular necrosis of bone)     bilateral hips; s/p hip replacement in 1991 and 2009  . Headache(784.0) 02/08/2012    Right Sided Headache Behind Right Eye  . Epistaxis 02/08/2012  . Blurred vision     Present for "many years"  . Fatigue   . Acid reflux   . History of radiation therapy 02/29/12-04/08/12    right  ethmoid sinus in 50.4Gy in 20 fxs  . Fatigue 05/25/12    "Mild"  . Thrombocytopenia 05/25/12    Past Surgical History  Procedure Laterality Date  . Bilateral hip replacement  1991; and 2009    due to bilateral hip AVN's.   . Mass biopsy  01/26/12    Right Ethmoid Mass - Plasma Cell Neoplasm     Current Outpatient Prescriptions  Medication Sig Dispense Refill  . cycloSPORINE (RESTASIS) 0.05 % ophthalmic emulsion Place 1 drop into both eyes 2 (two) times daily.      . dexamethasone (DECADRON) 4 MG tablet Take 1 tablet (4 mg total) by mouth every 6 (six) hours.  120 tablet  0  . escitalopram (LEXAPRO) 20 MG tablet Take 10 mg by mouth daily.      . HYDROcodone-acetaminophen (NORCO) 7.5-325 MG per tablet Take 1 tablet by mouth every 6 (six) hours as needed for pain.  60 tablet  0  . ibuprofen (ADVIL,MOTRIN) 200 MG tablet Take 600 mg by mouth every 6 (six) hours as needed for pain.      . lansoprazole (PREVACID) 15 MG capsule Take 15 mg by mouth daily.      . LORazepam (ATIVAN) 1 MG tablet Take 1 mg by mouth every 8 (eight) hours as needed for anxiety.       . morphine (MS CONTIN) 15 MG 12 hr tablet Take 1 tablet (15 mg total) by mouth 2 (two) times daily.  60 tablet  0  . naproxen sodium (ALEVE) 220 MG tablet Take 220 mg by mouth 2 (two) times daily as needed (Pain).      . olmesartan (BENICAR) 20 MG tablet Take 20 mg by mouth every morning.       .   ondansetron (ZOFRAN) 8 MG tablet Take 1 tablet (8 mg total) by mouth every 8 (eight) hours as needed for nausea.  20 tablet  2  . triamcinolone (NASACORT) 55 MCG/ACT nasal inhaler Place 2 sprays into the nose daily.      . lactulose (CHRONULAC) 10 GM/15ML solution Take 45 mLs (30 g total) by mouth 3 (three) times daily as needed (severe constipation.).  240 mL  0   No current facility-administered medications for this visit.    ALLERGIES:  has No Known Allergies.  REVIEW OF SYSTEMS:  The rest of the 14-point review of system was negative.   Filed Vitals:   03/17/13 1558  BP: 111/58  Pulse: 68  Temp: 97.9 F (36.6 C)  Resp: 18   Wt Readings from Last 3 Encounters:  03/17/13 274 lb 1.6 oz (124.331 kg)  03/10/13 273 lb 11.2 oz (124.15 kg)  03/03/13 275 lb (124.739 kg)   ECOG Performance status: 0-1  PHYSICAL EXAMINATION:    General:  well-nourished man, in no acute distress.  Eyes:  no scleral icterus.  ENT:  There were no oropharyngeal lesions.  Neck was without thyromegaly.  Lymphatics:  Negative cervical, supraclavicular or axillary adenopathy.  Respiratory: lungs were clear bilaterally without wheezing or crackles.  Cardiovascular:  Regular rate and rhythm, S1/S2, without murmur, rub or gallop.  There was no pedal edema.  GI:  abdomen was soft, flat, nontender, nondistended, without organomegaly.  Muscoloskeletal:  no spinal tenderness of palpation of vertebral spine.  Skin exam was without echymosis, petichae.  Neuro exam was nonfocal.  Patient was able to get on and off exam table without assistance.  Gait was normal.  Patient was alert and oriented.  Attention was good.   Language was appropriate.  Mood was normal without depression.  Speech was not pressured.  Thought content was not tangential.       LABORATORY/RADIOLOGY DATA:  Lab Results  Component Value Date   WBC 3.7* 03/08/2013   HGB 16.4 03/08/2013   HCT 45.2 03/08/2013   PLT 120* 03/08/2013   GLUCOSE 118* 03/08/2013   ALKPHOS 86 03/08/2013   ALT 56* 03/08/2013   AST 32 03/08/2013   NA 139 03/08/2013   K 3.9 03/08/2013   CL 105 03/08/2013   CREATININE 1.1 03/08/2013   BUN 15.7 03/08/2013   CO2 25 03/08/2013   Blood kappa:lamdba ratio was 21.12 on 03/08/13.   Bone marrow biopsy on 03/10/13 showed only 2% plasma cell.  24hour urine collection showed monoclonal free kappa light chain (with ratio kappa:lambda at 205 (normal raditio 2.04-10.37).     RADIOLOGY:  I personally reviewed the following studies and showed them the images from PET scan.  Dg Chest 2 View  03/03/2013   *RADIOLOGY REPORT*  Clinical Data: Left-sided chest pain for 3 months, no known injury  CHEST - 2 VIEW  Comparison: Prior chest x-ray 01/26/2013; prior PET CT 02/11/2012  Findings: Irregular appearing fracture of the posterolateral aspect of the left seventh rib.  There appears to  be increased lucency and rarefaction of the bone compared to the prior chest x-ray raising concern for an underlying aggressive lytic lesion.  Cardiac and mediastinal contours within normal limits.  No pneumothorax, pleural effusion or suspicious pulmonary nodule.  The lungs are clear.  IMPRESSION:  Probable destructive lytic lesion and associated pathologic fracture involving the posterolateral aspect of the left seventh rib.  Given the patient's history of prior right ethmoid sinus plasmacytoma, this is concerning for metastatic   plasmacytoma/multiple myeloma.  Consider further evaluation with dedicated non emergent chest CT or PET CT.   Original Report Authenticated By: Heath McCullough, M.D.   Ct Chest W Contrast  03/03/2013   *RADIOLOGY REPORT*  Clinical Data: Fall a few months ago with rib fracture on the left. New shortness of breath and left chest pain  CT CHEST WITH CONTRAST  Technique:  Multidetector CT imaging of the chest was performed following the standard protocol during bolus administration of intravenous contrast.  Contrast: 80mL OMNIPAQUE IOHEXOL 300 MG/ML  SOLN  Comparison: 02/11/2012 PET CT  Findings: Normal thyroid gland.  No evidence of mediastinal hemorrhage, adenopathy, or abnormal fluid.  No pericardial effusion.  No evidence of aortic injury.  Specifically no abrupt caliber change or intimal irregularity.  Mild atherosclerotic vascular calcifications of the aortic arch.  No pneumothorax or pleural effusion.  Clear lungs.  A pathologic left posterolateral 7th rib fracture is noted.  There is a soft tissue mass in the lytic process associated with the fractured ribs.  There is bony expansion of the lesion with cortical breakthrough.  No other obvious destructive bony lesions can be identified.  IMPRESSION: The fracture of the left seventh rib represents a pathologic fracture.  There is a destructive soft tissue mass at the site of the fracture worrisome for malignancy.  Otherwise, no acute  process in the thorax.  The patient does have a history of head and neck plasmacytoma.   Original Report Authenticated By: Arthur Hoss, M.D.   Nm Bone Scan Whole Body  03/15/2013   *RADIOLOGY REPORT*  Clinical Data: Plasmacytoma  NUCLEAR MEDICINE WHOLE BODY BONE SCINTIGRAPHY  Technique:  Whole body anterior and posterior images were obtained approximately 3 hours after intravenous injection of radiopharmaceutical.  Radiopharmaceutical: 25MILLI CURIE TC-MDP TECHNETIUM TC 99M MEDRONATE IV KIT  Comparison: 03/02/2012  Findings: Large focus of intense radiotracer uptake localizes to the approximate left seventh rib posteriorly.  A smaller focus of increased uptake localizes to the posterior aspect of the left second rib.  Corresponding lytic lesions are identified on CT from 03/03/2013.  No additional foci of increased uptake identified within the axial or appendicular skeleton. Normal physiologic tracer activity noted within the kidneys and urinary bladder.  IMPRESSION:  1.  Abnormal uptake localizes to the left posterior ribs consistent with multifocal plasmocytomas.   Original Report Authenticated By: Taylor Stroud, M.D.   Nm Pet Image Restag (ps) Skull Base To Thigh  03/17/2013   *RADIOLOGY REPORT*  Clinical Data: Subsequent treatment strategy for plasmacytoma. New lytic rib lesions.  NUCLEAR MEDICINE PET SKULL BASE TO THIGH  Fasting Blood Glucose:  100  Technique:  19.3 mCi F-18 FDG was injected intravenously. CT data was obtained and used for attenuation correction and anatomic localization only.  (This was not acquired as a diagnostic CT examination.) Additional exam technical data entered on technologist worksheet.  Comparison:  Chest CT 03/03/2013 and nuclear medicine whole-body bone scan 03/15/2013.  PET CT 02/11/2012.  Findings:  Neck: I do not see any residual metabolically active tumor in the region of the sphenoid sinus and I do not see any definite destructive bone lesions involving the clivus/skull  base.  No neck adenopathy.  Chest:  No hypermetabolic mediastinal or hilar nodes.  No suspicious pulmonary nodules on the CT scan.  Abdomen/Pelvis:  There are two lesions in the pancreas.  One is in the head and one is in the tail and splenic hilum.  These demonstrate FDG uptake with SUV max of   13.8 in the tail of the pancreas and 7.8 in the head of the pancreas.  Three liver lesions are also suspected.  These  have SUV max of 7.8.  MRI abdomen without and with contrast is suggested for further evaluation.  No adenopathy.  Skeleton:  There are numerous lytic myelomatous bone lesions involving thoracic and lumbar vertebral bodies, left sided ribs the left iliac bone.  No spinal canal compromise.  IMPRESSION:  1.  Numerous lytic myelomatous bone lesions involving the spine, left ribs and left iliac bone. 2.  Two pancreatic lesions and possibly three liver lesions suspicious for metastatic disease.  MRI abdomen without and with contrast may be helpful for further evaluation. 3.  No residual or recurrent tumor in the sphenoid sinus or skull base.   Original Report Authenticated By: P. Gallerani, M.D.     ASSESSMENT AND PLAN:   1.  Diagnosis:  Multiple plasmacytoma (was solitary last year).  No evidence of multiple myeloma (meaning it's not in the bone marrow, nor is it causing anemia, kidney problem, or high calcium).  Work up:  I do recommend rib biopsy to prove plasmacytoma since we only have suggestion of plasma cell disorder on blood work and UPEP.   1a.  Pancreas mass:  Most likely plasmacytoma.  However, we do not want to miss pancreas cancer.  I strongly recommend referral to GI for ERCP to biopsy the pancreas mass.   2.  Treatment:    * Local therapy with radiation to the spots with most pain.    * Start chemo therapy after finish radiation followed by bone marrow transplant.  Without treatment with chemo, plasmacytoma can spread to many other organs causing symptoms in the future.   3.  Options  for chemo:  *  Oral + SQ chemo:  Revlimid (days 1-21); Velcade SQ once a week (3 weeks on, 1 week off);  Dexamethasone (once a week); every 4 week-cycle.  *  Clinical trial CTSU [id number]:  Same regimen as above except for randomization between Velcade and carfilzomib.  Carfilzomib is very effective in patients with relapsed myeloma.  It is not approved for front line therapy.  The clinical trial looks at whether Velcade or carfilzomib is better in the frontline setting.   *  In patients who are reasonably in good health, we may consider autologous bone marrow transplant to improve chance of cure. Evaluation by [facility] Dr. Gasperetto within 1-2 weeks for opinion regarding chemo and bone marrow transplant.   4.  Potential side effects of these chemo:  Low blood count, bleeding, infection, neuropathy, skin rash, blood clot.   5.  Chance of response:  Upward to 70-80% depending on risk stratification.    6.  How to follow response:   serum light chain should decrease with chemo.    7.  For constipation:  I advised him to take Lactulose which I prescribed today in addition to enema.    8.  For pain control:  I advised him to stop MS contin and only take Vicodin prn every 6-8 hours.  I advised him to taper Dexamethasone 4mg PO to only BID (from TID).   9.   Follow up:  In about 7-10 days to discuss result of rib biopsy for proof of plasmacytoma and start therapy.      The length of time of the face-to-face encounter was 45 minutes. More than 50% of time was spent counseling and coordination of care.          Huan T. Ha, M.D.     

## 2013-03-24 NOTE — Interval H&P Note (Signed)
History and Physical Interval Note:  03/24/2013 8:39 AM  Austin Griffith  has presented today for surgery, with the diagnosis of pancreatic mass  The various methods of treatment have been discussed with the patient and family. After consideration of risks, benefits and other options for treatment, the patient has consented to  Procedure(s): UPPER ENDOSCOPIC ULTRASOUND (EUS) LINEAR (N/A) FINE NEEDLE ASPIRATION (FNA) LINEAR (N/A) as a surgical intervention .  The patient's history has been reviewed, patient examined, no change in status, stable for surgery.  I have reviewed the patient's chart and labs.  Questions were answered to the patient's satisfaction.     Shanelle Clontz D

## 2013-03-27 ENCOUNTER — Ambulatory Visit: Payer: No Typology Code available for payment source

## 2013-03-28 ENCOUNTER — Telehealth: Payer: Self-pay | Admitting: Oncology

## 2013-03-28 ENCOUNTER — Encounter: Payer: Self-pay | Admitting: Radiation Oncology

## 2013-03-28 ENCOUNTER — Ambulatory Visit: Payer: No Typology Code available for payment source

## 2013-03-28 ENCOUNTER — Ambulatory Visit
Admit: 2013-03-28 | Discharge: 2013-03-28 | Disposition: A | Discharge status: Home / Self Care | Payer: No Typology Code available for payment source | Attending: Radiation Oncology | Admitting: Radiation Oncology

## 2013-03-28 ENCOUNTER — Ambulatory Visit
Admission: RE | Admit: 2013-03-28 | Discharge: 2013-03-28 | Disposition: A | Discharge status: Home / Self Care | Payer: No Typology Code available for payment source | Source: Ambulatory Visit | Attending: Radiation Oncology | Admitting: Radiation Oncology

## 2013-03-28 VITALS — BP 114/83 | HR 80 | Temp 99.1°F | Ht 77.0 in | Wt 275.9 lb

## 2013-03-28 DIAGNOSIS — C7951 Secondary malignant neoplasm of bone: Secondary | ICD-10-CM

## 2013-03-28 DIAGNOSIS — D729 Disorder of white blood cells, unspecified: Secondary | ICD-10-CM

## 2013-03-28 DIAGNOSIS — Z923 Personal history of irradiation: Secondary | ICD-10-CM

## 2013-03-28 HISTORY — DX: Personal history of irradiation: Z92.3

## 2013-03-28 NOTE — Progress Notes (Addendum)
Weekly Management Note Current Dose: 8  Gy  Projected Dose: 8 Gy   Narrative:  The patient presents for routine under treatment assessment.  CBCT/MVCT images/Port film x-rays were reviewed.  The chart was checked. Doing well. Taking 1 hydrocodone per day with good control of pain.   Physical Findings: Weight: 275 lb 14.4 oz (125.147 kg). Alert and oriented.  Impression:  The patient tolerated his single fraction of radiation well.  Plan:  Follow up in 1 month. Call with questions in the interim. Has f/u scheduled with medical oncology as well.

## 2013-03-28 NOTE — Telephone Encounter (Signed)
Pt appt. With Dr. Barbaraann Boys @ [facility] 05/03/13@10 :00. Pt is aware by vm

## 2013-03-28 NOTE — Progress Notes (Signed)
  Radiation Oncology         [phone]) [phone] ________________________________  Name: Austin Griffith MRN: [id number]  Date: 03/28/2013  DOB: 12/29/1960  Simulation Verification Note  Status: outpatient  NARRATIVE: The patient was brought to the treatment unit and placed in the planned treatment position. The clinical setup was verified. Then port films were obtained and uploaded to the radiation oncology medical record software.  The treatment beams were carefully compared against the planned radiation fields. The position location and shape of the radiation fields was reviewed. They targeted volume of tissue appears to be appropriately covered by the radiation beams. Organs at risk appear to be excluded as planned.  Based on my personal review, I approved the simulation verification. The patient's treatment will proceed as planned.  -----------------------------------  Billie Lade, PhD, MD

## 2013-03-29 ENCOUNTER — Ambulatory Visit: Payer: No Typology Code available for payment source

## 2013-03-30 ENCOUNTER — Ambulatory Visit: Payer: No Typology Code available for payment source

## 2013-03-30 ENCOUNTER — Other Ambulatory Visit: Payer: No Typology Code available for payment source | Admitting: Lab

## 2013-03-30 ENCOUNTER — Telehealth: Payer: Self-pay | Admitting: Oncology

## 2013-03-30 ENCOUNTER — Telehealth: Payer: Self-pay | Admitting: *Deleted

## 2013-03-30 ENCOUNTER — Other Ambulatory Visit (HOSPITAL_BASED_OUTPATIENT_CLINIC_OR_DEPARTMENT_OTHER): Payer: No Typology Code available for payment source | Admitting: Lab

## 2013-03-30 ENCOUNTER — Ambulatory Visit (HOSPITAL_BASED_OUTPATIENT_CLINIC_OR_DEPARTMENT_OTHER): Payer: No Typology Code available for payment source | Admitting: Oncology

## 2013-03-30 ENCOUNTER — Encounter: Payer: Self-pay | Admitting: Oncology

## 2013-03-30 VITALS — BP 130/81 | HR 88 | Temp 98.0°F | Resp 18 | Ht 75.0 in | Wt 276.6 lb

## 2013-03-30 DIAGNOSIS — K8689 Other specified diseases of pancreas: Secondary | ICD-10-CM

## 2013-03-30 DIAGNOSIS — C9000 Multiple myeloma not having achieved remission: Secondary | ICD-10-CM

## 2013-03-30 DIAGNOSIS — D729 Disorder of white blood cells, unspecified: Secondary | ICD-10-CM

## 2013-03-30 DIAGNOSIS — R160 Hepatomegaly, not elsewhere classified: Secondary | ICD-10-CM

## 2013-03-30 DIAGNOSIS — B370 Candidal stomatitis: Secondary | ICD-10-CM

## 2013-03-30 DIAGNOSIS — D7289 Other specified disorders of white blood cells: Secondary | ICD-10-CM

## 2013-03-30 DIAGNOSIS — K5900 Constipation, unspecified: Secondary | ICD-10-CM

## 2013-03-30 DIAGNOSIS — C888 Other malignant immunoproliferative diseases: Secondary | ICD-10-CM

## 2013-03-30 LAB — COMPREHENSIVE METABOLIC PANEL (CC13)
AST: 16 U/L (ref 5–34)
Alkaline Phosphatase: 91 U/L (ref 40–150)
BUN: 22.2 mg/dL (ref 7.0–26.0)
Creatinine: 1 mg/dL (ref 0.7–1.3)
Potassium: 4.2 mEq/L (ref 3.5–5.1)
Total Bilirubin: 0.93 mg/dL (ref 0.20–1.20)

## 2013-03-30 LAB — CBC WITH DIFFERENTIAL/PLATELET
Basophils Absolute: 0 10*3/uL (ref 0.0–0.1)
EOS%: 0.4 % (ref 0.0–7.0)
HGB: 14.1 g/dL (ref 13.0–17.1)
MCH: 31.1 pg (ref 27.2–33.4)
MCV: 90 fL (ref 79.3–98.0)
MONO%: 6.2 % (ref 0.0–14.0)
NEUT%: 83.6 % — ABNORMAL HIGH (ref 39.0–75.0)
RDW: 14.5 % (ref 11.0–14.6)

## 2013-03-30 MED ORDER — LENALIDOMIDE 25 MG PO CAPS: 25.0000 mg | ORAL_CAPSULE | Freq: Every day | ORAL | Status: DC

## 2013-03-30 MED ORDER — ENOXAPARIN SODIUM 40 MG/0.4ML ~~LOC~~ SOLN: 40.0000 mg | SUBCUTANEOUS | Status: DC

## 2013-03-30 MED ORDER — ACYCLOVIR 400 MG PO TABS: 400.0000 mg | ORAL_TABLET | Freq: Two times a day (BID) | ORAL | Status: DC

## 2013-03-30 MED ORDER — PROCHLORPERAZINE MALEATE 10 MG PO TABS: 10.0000 mg | ORAL_TABLET | Freq: Four times a day (QID) | ORAL | Status: DC | PRN

## 2013-03-30 MED ORDER — DEXAMETHASONE 4 MG PO TABS: 40.0000 mg | ORAL_TABLET | ORAL | Status: DC

## 2013-03-30 MED ORDER — FLUCONAZOLE 100 MG PO TABS: 100.0000 mg | ORAL_TABLET | Freq: Every day | ORAL | Status: DC

## 2013-03-30 NOTE — Patient Instructions (Addendum)
1.  Oral thrush:  Take Diflucan 100mg  by mouth once daily x 10 days.  2.  Multiple plasmacytoma:  *  Revlimid 25mg  by mouth d1-21 every 28 day-cycle;  Velcade SQ by chemo nurse once a week, 3 weeks on , 1 week of.  Dexamethasone 40mg  by mouth once a week including the week off of chemo.  ALL to be started in about 10 days.   *  Acyclovir once daily to decrease risk of viral infection.  *  Lovenox 40mg  subcutaneous once daily to decrease risk of blood clot.    3.  To do list:  *  Get these medications filled.  *  Chemo class within 1 week.  *  START Diflucan now for oral thrush.  *  DO NOT START the other medications until we see each other in clinic in about 10 days.

## 2013-03-30 NOTE — Telephone Encounter (Signed)
Per staff message and POF I have scheduled appts.  JMW  

## 2013-03-30 NOTE — Progress Notes (Signed)
  Radiation Oncology         [phone]) [phone] ________________________________  Name: Austin Griffith MRN: [id number]  Date: 03/28/2013  DOB: 04/17/1961  End of Treatment Note  Diagnosis:   Multiple Myeloma, Rib metastasis  Indication for treatment:  palliative       Radiation treatment dates:   03/28/2013  Site/dose:   Left posterior 7th Rib / 8Gy in 1 fraction  Beams/energy:   Obliques /10 and 15 MV photons  Narrative: The patient tolerated radiation treatment relatively well.    Plan: The patient has completed radiation treatment. The patient will return to radiation oncology clinic for routine followup in one month. I advised them to call or return sooner if they have any questions or concerns related to their recovery or treatment.  -----------------------------------  Lonie Peak, MD

## 2013-03-30 NOTE — Progress Notes (Signed)
Patient and wife came in office. They have insurance and feel they are close to deductible and then they will be paid at 100%. I gave the billing ph# to inquire details. They are well over [phone] for sure.

## 2013-03-31 ENCOUNTER — Ambulatory Visit
Admission: RE | Admit: 2013-03-31 | Discharge: 2013-03-31 | Disposition: A | Discharge status: Home / Self Care | Payer: No Typology Code available for payment source | Source: Ambulatory Visit | Attending: Oncology | Admitting: Oncology

## 2013-03-31 ENCOUNTER — Ambulatory Visit: Payer: No Typology Code available for payment source

## 2013-03-31 DIAGNOSIS — C7951 Secondary malignant neoplasm of bone: Secondary | ICD-10-CM

## 2013-03-31 DIAGNOSIS — K869 Disease of pancreas, unspecified: Secondary | ICD-10-CM

## 2013-03-31 DIAGNOSIS — K8689 Other specified diseases of pancreas: Secondary | ICD-10-CM

## 2013-03-31 DIAGNOSIS — R160 Hepatomegaly, not elsewhere classified: Secondary | ICD-10-CM

## 2013-03-31 DIAGNOSIS — K769 Liver disease, unspecified: Secondary | ICD-10-CM

## 2013-03-31 HISTORY — DX: Liver disease, unspecified: K76.9

## 2013-03-31 HISTORY — DX: Secondary malignant neoplasm of bone: C79.51

## 2013-03-31 HISTORY — DX: Disease of pancreas, unspecified: K86.9

## 2013-03-31 MED ORDER — GADOBENATE DIMEGLUMINE 529 MG/ML IV SOLN
20.0000 mL | Freq: Once | INTRAVENOUS | Status: AC | PRN
  Administered 2013-03-31: 20 mL via INTRAVENOUS

## 2013-03-31 NOTE — Progress Notes (Signed)
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   PAST THERAPY: He completed 50.4 gray in 28 fractions on 04/08/2012  CURRENT THERAPY: discussing therapy for multiple plasmacytoma.   INTERVAL HISTORY: Austin Griffith 52 y.o. male returns for regular follow up with his wife.  He no longer has rib pain.  He has been on Decadron only once daily.  He has been having oral white discharge and moderate mucositis for the last two days.  These symptoms have been progressing.  He denies fever, anorexia, weight loss, fatigue, headache, visual changes, confusion, drenching night sweats, palpable lymph node swelling, odynophagia, dysphagia, nausea vomiting, jaundice, chest pain, palpitation, shortness of breath, dyspnea on exertion, productive cough, gum bleeding, epistaxis, hematemesis, hemoptysis, abdominal pain, abdominal swelling, early satiety, melena, hematochezia, hematuria, skin rash, spontaneous bleeding, joint swelling, joint pain, heat or cold intolerance, bowel bladder incontinence, focal motor weakness, paresthesia, depression.    Past Medical History  Diagnosis Date  . Hypertension   . Plasmacytoma      Right Ethmoid Sinus; bone marrow biopsy on 03/10/13 showed normal myeloma FISH panel and cytogenetics.   . Hyperlipidemia     diet control   . AVN (avascular necrosis of bone)     bilateral hips; s/p hip replacement in 1991 and 2009  . Headache(784.0) 02/08/2012    Right Sided Headache Behind Right Eye  . Epistaxis 02/08/2012  . Blurred vision     Present for "many years"  . Fatigue   . Acid reflux   . History of radiation therapy 02/29/12-04/08/12    right  ethmoid sinus in 50.4Gy in 20 fxs  . Fatigue 05/25/12    "Mild"  . Thrombocytopenia 05/25/12    Past Surgical History    Procedure Laterality Date  . Bilateral hip replacement  1991; and 2009    due to bilateral hip AVN's.   . Mass biopsy  01/26/12    Right Ethmoid Mass - Plasma Cell Neoplasm  . Eus N/A 03/24/2013    Procedure: UPPER ENDOSCOPIC ULTRASOUND (EUS) LINEAR;  Surgeon: Theda Belfast, MD;  Location: [facility];  Service: Endoscopy;  Laterality: N/A;  . Fine needle aspiration N/A 03/24/2013    Procedure: FINE NEEDLE ASPIRATION (FNA) LINEAR;  Surgeon: Theda Belfast, MD;  Location: [facility];  Service: Endoscopy;  Laterality: N/A;    Current Outpatient Prescriptions  Medication Sig Dispense Refill  . cycloSPORINE (RESTASIS) 0.05 % ophthalmic emulsion Place 1 drop into both eyes 2 (two) times daily.      Marland Kitchen dexamethasone (DECADRON) 4 MG tablet Take 1 tablet (4 mg total) by mouth every 6 (six) hours.  120 tablet  0  . escitalopram (LEXAPRO) 20 MG tablet Take 10 mg by mouth daily.      Marland Kitchen HYDROcodone-acetaminophen (NORCO) 7.5-325 MG per tablet Take 1 tablet by mouth every 6 (six) hours as needed for pain.  60 tablet  0  . ibuprofen (ADVIL,MOTRIN) 200 MG tablet Take 600 mg by mouth every 6 (six) hours as needed for pain.      Marland Kitchen lactulose (CHRONULAC) 10 GM/15ML solution Take 45 mLs (30 g total) by mouth 3 (three) times daily as needed (severe constipation.).  240 mL  0  . lansoprazole (PREVACID) 15 MG capsule Take 15 mg by mouth daily.      Marland Kitchen  lenalidomide (REVLIMID) 25 MG capsule Take 1 capsule (25 mg total) by mouth daily.  21 capsule  0  . LORazepam (ATIVAN) 1 MG tablet Take 1 mg by mouth every 8 (eight) hours as needed for anxiety.       Marland Kitchen morphine (MS CONTIN) 15 MG 12 hr tablet Take 1 tablet (15 mg total) by mouth 2 (two) times daily.  60 tablet  0  . naproxen sodium (ALEVE) 220 MG tablet Take 220 mg by mouth 2 (two) times daily as needed (Pain).      Marland Kitchen olmesartan (BENICAR) 20 MG tablet Take 20 mg by mouth every morning.       . ondansetron (ZOFRAN) 8 MG tablet Take 1 tablet (8 mg total) by mouth  every 8 (eight) hours as needed for nausea.  20 tablet  2  . triamcinolone (NASACORT) 55 MCG/ACT nasal inhaler Place 2 sprays into the nose daily.      Marland Kitchen acyclovir (ZOVIRAX) 400 MG tablet Take 1 tablet (400 mg total) by mouth 2 (two) times daily.  60 tablet  3  . dexamethasone (DECADRON) 4 MG tablet Take 10 tablets (40 mg total) by mouth once a week. Take daily starting the day after chemotherapy for 2 days. Take with food.  100 tablet  1  . enoxaparin (LOVENOX) 40 MG/0.4ML injection Inject 0.4 mLs (40 mg total) into the skin daily.  30 Syringe  3  . fluconazole (DIFLUCAN) 100 MG tablet Take 1 tablet (100 mg total) by mouth daily.  10 tablet  0  . prochlorperazine (COMPAZINE) 10 MG tablet Take 1 tablet (10 mg total) by mouth every 6 (six) hours as needed (Nausea or vomiting).  30 tablet  1   No current facility-administered medications for this visit.    ALLERGIES:  has No Known Allergies.  REVIEW OF SYSTEMS:  The rest of the 14-point review of system was negative.   Filed Vitals:   03/30/13 1354  BP: 130/81  Pulse: 88  Temp: 98 F (36.7 C)  Resp: 18   Wt Readings from Last 3 Encounters:  03/30/13 276 lb 9.6 oz (125.465 kg)  03/28/13 275 lb 14.4 oz (125.147 kg)  03/21/13 275 lb (124.739 kg)   ECOG Performance status: 0-1  PHYSICAL EXAMINATION:   General:  well-nourished man, in no acute distress.  Eyes:  no scleral icterus.  ENT:  There was white exudative discharge in the mouth.  Neck was without thyromegaly.  Lymphatics:  Negative cervical, supraclavicular or axillary adenopathy.  Respiratory: lungs were clear bilaterally without wheezing or crackles.  Cardiovascular:  Regular rate and rhythm, S1/S2, without murmur, rub or gallop.  There was no pedal edema.  GI:  abdomen was soft, flat, nontender, nondistended, without organomegaly.  Muscoloskeletal:  no spinal tenderness of palpation of vertebral spine.  Skin exam was without echymosis, petichae.  Neuro exam was nonfocal.  Patient  was able to get on and off exam table without assistance.  Gait was normal.  Patient was alert and oriented.  Attention was good.   Language was appropriate.  Mood was normal without depression.  Speech was not pressured.  Thought content was not tangential.       LABORATORY/RADIOLOGY DATA:  Lab Results  Component Value Date   WBC 6.2 03/30/2013   HGB 14.1 03/30/2013   HCT 40.9 03/30/2013   PLT 107 confirmed both analyzers* 03/30/2013   GLUCOSE 120* 03/30/2013   ALKPHOS 91 03/30/2013   ALT 42 03/30/2013   AST 16  03/30/2013   NA 138 03/30/2013   K 4.2 03/30/2013   CL 105 03/30/2013   CREATININE 1.0 03/30/2013   BUN 22.2 03/30/2013   CO2 23 03/30/2013   INR 0.99 03/21/2013    ASSESSMENT AND PLAN:   1.  Recurrent multiple plasmacytoma:   - Given the rapid rate of recurrence after less than 1 year from radiation treatment for solitary plasmacytoma, I recommended systemic treatment.  - I again went over the detail of the following treatment.   *  Oral + SQ chemo:  Revlimid (days 1-21); Velcade SQ once a week (3 weeks on, 1 week off);  Dexamethasone (once a week); every 4 week-cycle.  *  The chemo can cause the following potential side effects which include but not limited to cytopenia, bleeding, infection, neuropathy, skin rash, blood clot, diarrhea/constipation, hypokalemia, birth defect.   *  Mr. Narine expressed informed understanding and agreed to start.   *  For supportive care, I advised him to take Acyclovir to prevent viral infection and Lovenox 40mg  SQ daily to prevent thrombosis.   *  He has appointment with [facility] in July 2014.   2.  Pancreas mass:  The head was biopsied and was plasmacytoma.  The tail was negative on biopsy.  We will continue to monitor the response with repeat PET scan.  3.  Potential liver masses:  Most likely plasmacytoma as well.  He will have abdominal MRI for further characterization and biopsy if deemed safe by IR.   4.  Oral thrush:  I started him on  Diflucan 100mg  PO daily x 10.   5.  For pain control: He has Vicodin prn every 6-8 hours.  I advised him to stop the taper Dexamethasone for the pain and only take Dexamethasone along with chemo ponce a week.   6.   Follow up:  In about 7-10 days to go over last minute questions before starting chemo with Velcade that day.      The length of time of the face-to-face encounter was 25 minutes. More than 50% of time was spent counseling and coordination of care.        Manika Hast T. Gaylyn Rong, M.D.

## 2013-04-03 ENCOUNTER — Other Ambulatory Visit: Payer: Self-pay | Admitting: Oncology

## 2013-04-03 ENCOUNTER — Ambulatory Visit: Payer: No Typology Code available for payment source

## 2013-04-04 ENCOUNTER — Telehealth: Payer: Self-pay | Admitting: Oncology

## 2013-04-04 ENCOUNTER — Telehealth: Payer: Self-pay | Admitting: *Deleted

## 2013-04-04 ENCOUNTER — Ambulatory Visit: Payer: No Typology Code available for payment source

## 2013-04-04 ENCOUNTER — Other Ambulatory Visit: Payer: No Typology Code available for payment source

## 2013-04-04 ENCOUNTER — Encounter: Payer: Self-pay | Admitting: Oncology

## 2013-04-04 NOTE — Telephone Encounter (Signed)
No other note. 

## 2013-04-05 ENCOUNTER — Encounter: Payer: Self-pay | Admitting: Oncology

## 2013-04-05 ENCOUNTER — Other Ambulatory Visit: Payer: Self-pay

## 2013-04-05 ENCOUNTER — Ambulatory Visit: Payer: No Typology Code available for payment source

## 2013-04-05 ENCOUNTER — Other Ambulatory Visit: Payer: Self-pay | Admitting: Oncology

## 2013-04-05 DIAGNOSIS — D729 Disorder of white blood cells, unspecified: Secondary | ICD-10-CM

## 2013-04-05 NOTE — Progress Notes (Signed)
Per [facility] patient's revlimid copay is 0.

## 2013-04-06 ENCOUNTER — Other Ambulatory Visit: Payer: No Typology Code available for payment source

## 2013-04-06 ENCOUNTER — Ambulatory Visit: Payer: No Typology Code available for payment source | Admitting: Oncology

## 2013-04-06 ENCOUNTER — Other Ambulatory Visit: Payer: Self-pay | Admitting: *Deleted

## 2013-04-06 ENCOUNTER — Encounter: Payer: Self-pay | Admitting: *Deleted

## 2013-04-06 NOTE — Progress Notes (Signed)
Request received for ICD-9 code from [employer] for Revlimid.  Faxed over office note and code 238.6 Plasmacytoma to [employer] at fax #[phone].

## 2013-04-10 ENCOUNTER — Telehealth: Payer: Self-pay | Admitting: Oncology

## 2013-04-10 ENCOUNTER — Ambulatory Visit (HOSPITAL_BASED_OUTPATIENT_CLINIC_OR_DEPARTMENT_OTHER): Payer: No Typology Code available for payment source | Admitting: Oncology

## 2013-04-10 ENCOUNTER — Ambulatory Visit (HOSPITAL_BASED_OUTPATIENT_CLINIC_OR_DEPARTMENT_OTHER): Payer: No Typology Code available for payment source

## 2013-04-10 ENCOUNTER — Encounter: Payer: Self-pay | Admitting: Oncology

## 2013-04-10 ENCOUNTER — Other Ambulatory Visit (HOSPITAL_BASED_OUTPATIENT_CLINIC_OR_DEPARTMENT_OTHER): Payer: No Typology Code available for payment source

## 2013-04-10 VITALS — BP 110/75 | HR 90 | Temp 98.4°F | Resp 18 | Ht 75.0 in | Wt 273.6 lb

## 2013-04-10 DIAGNOSIS — C888 Other malignant immunoproliferative diseases not having achieved remission: Secondary | ICD-10-CM

## 2013-04-10 DIAGNOSIS — D729 Disorder of white blood cells, unspecified: Secondary | ICD-10-CM

## 2013-04-10 DIAGNOSIS — B370 Candidal stomatitis: Secondary | ICD-10-CM

## 2013-04-10 DIAGNOSIS — C9022 Extramedullary plasmacytoma in relapse: Secondary | ICD-10-CM

## 2013-04-10 DIAGNOSIS — Z5112 Encounter for antineoplastic immunotherapy: Secondary | ICD-10-CM

## 2013-04-10 HISTORY — DX: Extramedullary plasmacytoma in relapse: C90.22

## 2013-04-10 LAB — BASIC METABOLIC PANEL (CC13)
BUN: 12.1 mg/dL (ref 7.0–26.0)
Creatinine: 1 mg/dL (ref 0.7–1.3)

## 2013-04-10 LAB — CBC WITH DIFFERENTIAL/PLATELET
Basophils Absolute: 0 10*3/uL (ref 0.0–0.1)
EOS%: 2.2 % (ref 0.0–7.0)
HCT: 37.6 % — ABNORMAL LOW (ref 38.4–49.9)
HGB: 13.3 g/dL (ref 13.0–17.1)
MCH: 31.8 pg (ref 27.2–33.4)
MCV: 89.6 fL (ref 79.3–98.0)
MONO%: 9.7 % (ref 0.0–14.0)
NEUT%: 71.7 % (ref 39.0–75.0)
Platelets: 111 10*3/uL — ABNORMAL LOW (ref 140–400)

## 2013-04-10 MED ORDER — BORTEZOMIB CHEMO SQ INJECTION 3.5 MG (2.5MG/ML)
1.3000 mg/m2 | Freq: Once | INTRAMUSCULAR | Status: AC
  Administered 2013-04-10: 3.25 mg via SUBCUTANEOUS
  Filled 2013-04-10: qty 3.25

## 2013-04-10 MED ORDER — ONDANSETRON HCL 8 MG PO TABS
8.0000 mg | ORAL_TABLET | Freq: Once | ORAL | Status: AC
  Administered 2013-04-10: 8 mg via ORAL

## 2013-04-10 NOTE — Telephone Encounter (Signed)
, °

## 2013-04-10 NOTE — Progress Notes (Signed)
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   PAST THERAPY: He completed 50.4 gray in 28 fractions on 04/08/2012  CURRENT THERAPY: due to start chemo Rev/Vel/Dex today 04/10/13.   INTERVAL HISTORY: Austin Griffith 52 y.o. male returns for regular follow up with his wife.  His oral thrush completley resolved after a few days of Diflucan.  He no longer has mucositis pain.  He still has mild left rib pain but it is nothing compared to last month. He feels anxious about starting new therapy.  He still works full time at his own business.   Patient denies fever, anorexia, weight loss, fatigue, headache, visual changes, confusion, drenching night sweats, palpable lymph node swelling, mucositis, odynophagia, dysphagia, nausea vomiting, jaundice, chest pain, palpitation, shortness of breath, dyspnea on exertion, productive cough, gum bleeding, epistaxis, hematemesis, hemoptysis, abdominal pain, abdominal swelling, early satiety, melena, hematochezia, hematuria, skin rash, spontaneous bleeding, joint swelling, heat or cold intolerance, bowel bladder incontinence, focal motor weakness, paresthesia.     Past Medical History  Diagnosis Date  . Hypertension   . Plasmacytoma      Right Ethmoid Sinus; bone marrow biopsy on 03/10/13 showed normal myeloma FISH panel and cytogenetics.   . Hyperlipidemia     diet control   . AVN (avascular necrosis of bone)     bilateral hips; s/p hip replacement in 1991 and 2009  . Headache(784.0) 02/08/2012    Right Sided Headache Behind Right Eye  . Epistaxis 02/08/2012  . Blurred vision     Present for "many years"  . Fatigue   . Acid reflux   . History of radiation therapy 02/29/12-04/08/12    right  ethmoid sinus in 50.4Gy in 20 fxs  . Fatigue 05/25/12   "Mild"  . Thrombocytopenia 05/25/12    Past Surgical History  Procedure Laterality Date  . Bilateral hip replacement  1991; and 2009    due to bilateral hip AVN's.   . Mass biopsy  01/26/12    Right Ethmoid Mass - Plasma Cell Neoplasm  . Eus N/A 03/24/2013    Procedure: UPPER ENDOSCOPIC ULTRASOUND (EUS) LINEAR;  Surgeon: Theda Belfast, MD;  Location: [facility];  Service: Endoscopy;  Laterality: N/A;  . Fine needle aspiration N/A 03/24/2013    Procedure: FINE NEEDLE ASPIRATION (FNA) LINEAR;  Surgeon: Theda Belfast, MD;  Location: [facility];  Service: Endoscopy;  Laterality: N/A;    Current Outpatient Prescriptions  Medication Sig Dispense Refill  . acyclovir (ZOVIRAX) 400 MG tablet Take 1 tablet (400 mg total) by mouth 2 (two) times daily.  60 tablet  3  . cycloSPORINE (RESTASIS) 0.05 % ophthalmic emulsion Place 1 drop into both eyes 2 (two) times daily.      Marland Kitchen dexamethasone (DECADRON) 4 MG tablet Take 40 mg by mouth once a week.      . enoxaparin (LOVENOX) 40 MG/0.4ML injection Inject 0.4 mLs (40 mg total) into the skin daily.  30 Syringe  3  . escitalopram (LEXAPRO) 20 MG tablet Take 10 mg by mouth daily.      . fluconazole (DIFLUCAN) 100 MG tablet Take 1 tablet (100 mg total) by mouth daily.  10 tablet  0  . HYDROcodone-acetaminophen (NORCO) 7.5-325 MG per tablet Take 1 tablet by mouth every 6 (six) hours as needed for  pain.  60 tablet  0  . ibuprofen (ADVIL,MOTRIN) 200 MG tablet Take 600 mg by mouth every 6 (six) hours as needed for pain.      Marland Kitchen lactulose (CHRONULAC) 10 GM/15ML solution Take 45 mLs (30 g total) by mouth 3 (three) times daily as needed (severe constipation.).  240 mL  0  . lansoprazole (PREVACID) 15 MG capsule Take 15 mg by mouth daily.      Marland Kitchen lenalidomide (REVLIMID) 25 MG capsule Take 1 capsule (25 mg total) by mouth daily.  21 capsule  0  . LORazepam (ATIVAN) 1 MG tablet Take 1 mg by mouth every 8 (eight) hours as needed for anxiety.       Marland Kitchen morphine (MS CONTIN)  15 MG 12 hr tablet Take 1 tablet (15 mg total) by mouth 2 (two) times daily.  60 tablet  0  . naproxen sodium (ALEVE) 220 MG tablet Take 220 mg by mouth 2 (two) times daily as needed (Pain).      Marland Kitchen olmesartan (BENICAR) 20 MG tablet Take 20 mg by mouth every morning.       . ondansetron (ZOFRAN) 8 MG tablet Take 1 tablet (8 mg total) by mouth every 8 (eight) hours as needed for nausea.  20 tablet  2  . prochlorperazine (COMPAZINE) 10 MG tablet Take 1 tablet (10 mg total) by mouth every 6 (six) hours as needed (Nausea or vomiting).  30 tablet  1  . triamcinolone (NASACORT) 55 MCG/ACT nasal inhaler Place 2 sprays into the nose daily.       No current facility-administered medications for this visit.    ALLERGIES:  has No Known Allergies.  REVIEW OF SYSTEMS:  The rest of the 14-point review of system was negative.   Filed Vitals:   04/10/13 1227  BP: 110/75  Pulse: 90  Temp: 98.4 F (36.9 C)  Resp: 18   Wt Readings from Last 3 Encounters:  04/10/13 273 lb 9.6 oz (124.104 kg)  03/30/13 276 lb 9.6 oz (125.465 kg)  03/28/13 275 lb 14.4 oz (125.147 kg)   ECOG Performance status: 0-1  PHYSICAL EXAMINATION:   General:  well-nourished man, in no acute distress.  Eyes:  no scleral icterus.  ENT:  Oropharynx was clear without any oral thrush.  Neck was without thyromegaly.  Lymphatics:  Negative cervical, supraclavicular or axillary adenopathy.  Respiratory: lungs were clear bilaterally without wheezing or crackles.  Cardiovascular:  Regular rate and rhythm, S1/S2, without murmur, rub or gallop.  There was no pedal edema.  GI:  abdomen was soft, flat, nontender, nondistended, without organomegaly.  Muscoloskeletal:  no spinal tenderness of palpation of vertebral spine.  Skin exam was without echymosis, petichae.  Neuro exam was nonfocal.  Patient was able to get on and off exam table without assistance.  Gait was normal.  Patient was alert and oriented.  Attention was good.   Language was  appropriate.  Mood was normal without depression.  Speech was not pressured.  Thought content was not tangential.       LABORATORY/RADIOLOGY DATA:  Lab Results  Component Value Date   WBC 3.0* 04/10/2013   HGB 13.3 04/10/2013   HCT 37.6* 04/10/2013   PLT 111* 04/10/2013   GLUCOSE 96 04/10/2013   ALKPHOS 91 03/30/2013   ALT 42 03/30/2013   AST 16 03/30/2013   NA 137 04/10/2013   K 3.9 04/10/2013   CL 105 03/30/2013   CREATININE 1.0 04/10/2013   BUN 12.1 04/10/2013  CO2 26 04/10/2013   INR 0.99 03/21/2013    ASSESSMENT AND PLAN:   1.  Recurrent multiple plasmacytoma:   - Given the rapid rate of recurrence after less than 1 year from radiation treatment for solitary plasmacytoma, I recommended systemic treatment.  - I again went over the detail of the following treatment plan and potential side effects.   *  Oral + SQ chemo:  Revlimid (days 1-21); Velcade SQ once a week (3 weeks on, 1 week off);  Dexamethasone (once a week); every 4 week-cycle.  *  The chemo can cause the following potential side effects which include but not limited to cytopenia, bleeding, infection, neuropathy, skin rash, blood clot, diarrhea/constipation, hypokalemia, birth defect.   *  Mr. Merriweather had resolution of his oral thrush and would like to start chemo today.  His Revlimid will arrive late today and he will start this and weekly Dex tomorrow.   *  For supportive care, I advised him to take Acyclovir to prevent viral infection and Lovenox 40mg  SQ daily to prevent thrombosis.   *  He has appointment with [facility] in late July 2014.   2.  Pancreas mass:  The head was biopsied and was plasmacytoma.  The tail was negative on biopsy.  We will continue to monitor the response with repeat PET scan.  3.  Potential liver masses:  Most likely plasmacytoma as well. Will continue to monitor this on follow up PET scan.  I see   4.  Oral thrush:  I started him on Diflucan 100mg  PO daily x 10.   5.  For pain control: He has  Vicodin prn every 6-8 hours.  I advised him to stop the taper Dexamethasone for the pain and only take Dexamethasone along with chemo ponce a week.   6.   Follow up:  In about 1 week for cycle #1, day #8 of chemo.      The length of time of the face-to-face encounter was 15 minutes. More than 50% of time was spent counseling and coordination of care.        Talor Cheema T. Gaylyn Rong, M.D.

## 2013-04-10 NOTE — Patient Instructions (Addendum)
[facility] Discharge Instructions for Patients Receiving Chemotherapy  Today you received the following chemotherapy agents: Velcade.  To help prevent nausea and vomiting after your treatment, we encourage you to take your nausea medication as prescribed.   If you develop nausea and vomiting that is not controlled by your nausea medication, call the clinic.   BELOW ARE SYMPTOMS THAT SHOULD BE REPORTED IMMEDIATELY:  *FEVER GREATER THAN 100.5 F  *CHILLS WITH OR WITHOUT FEVER  NAUSEA AND VOMITING THAT IS NOT CONTROLLED WITH YOUR NAUSEA MEDICATION  *UNUSUAL SHORTNESS OF BREATH  *UNUSUAL BRUISING OR BLEEDING  TENDERNESS IN MOUTH AND THROAT WITH OR WITHOUT PRESENCE OF ULCERS  *URINARY PROBLEMS  *BOWEL PROBLEMS  UNUSUAL RASH Items with * indicate a potential emergency and should be followed up as soon as possible.  Feel free to call the clinic you have any questions or concerns. The clinic phone number is [phone].    

## 2013-04-11 ENCOUNTER — Telehealth: Payer: Self-pay | Admitting: *Deleted

## 2013-04-11 ENCOUNTER — Encounter: Payer: Self-pay | Admitting: *Deleted

## 2013-04-11 NOTE — Telephone Encounter (Signed)
Message copied by Augusto Garbe on Tue Apr 11, 2013  3:19 PM ------      Message from: Lenn Sink I      Created: Mon Apr 10, 2013  2:46 PM      Regarding: follow up call       First time Velcade (SQ). No reaction. Dr. Gaylyn Rong.  ------

## 2013-04-11 NOTE — Progress Notes (Signed)
Fax from [employer], they shipped pt's Revlimid on 6/30 for delivery today.

## 2013-04-11 NOTE — Telephone Encounter (Signed)
Called [facility] at [phone] number(s).  Messge left requesting a return call for chemotherapy follow up.  Awaiting return call from patient.

## 2013-04-12 ENCOUNTER — Telehealth: Payer: Self-pay | Admitting: *Deleted

## 2013-04-12 NOTE — Telephone Encounter (Signed)
Wife left VM returning chemo f/u call.  States pt is doing very well with "no side effects" from the chemotherapy.  He is taking his anti emetics as directed and has not had any nausea. They will call if he has any problems.

## 2013-04-13 ENCOUNTER — Telehealth: Payer: Self-pay | Admitting: *Deleted

## 2013-04-13 NOTE — Telephone Encounter (Signed)
ELECTRONIC ORDER RECEIVED FROM DR.HA. NOTIFIED PT.'S WIFE TO HAVE PT. DRINK PLENTY OF FLUIDS AT LEAST 60 OUNCES PER DAY. ALSO PT. IS TO STOP HIS BENICAR. ENCOURAGED PT.'S WIFE TO UPDATE HER HUSBAND'S PRIMARY CARE PHYSICIAN CONCERNING THE BENICAR. INFORMED PT.'S WIFE THAT THERE WILL BE A PHYSICIAN ON CALL THE 4TH OF July HOLIDAY AND THIS WEEKEND IF NEEDED. SHE VOICES UNDERSTANDING.

## 2013-04-13 NOTE — Telephone Encounter (Signed)
PT. HAS BEEN LIGHTHEADED AND DIZZY FOR AN HOUR. FLUID INTAKE HAS BEEN 32 OUNCES TODAY NO OTHER PROBLEMS. PT. RECEIVED VELCADE SQ 04/10/13 AND STARTED REVLIMID ON 04/11/13. HE IS ALSO ON BENICAR 20MG  EVERY DAY. THIS NOTE WAS ROUTED TO DR.HA

## 2013-04-13 NOTE — Telephone Encounter (Signed)
Please advise him to drink plenty of fluid at least 60-oz/day.  STOP his Benicar.

## 2013-04-17 ENCOUNTER — Ambulatory Visit (HOSPITAL_BASED_OUTPATIENT_CLINIC_OR_DEPARTMENT_OTHER): Payer: No Typology Code available for payment source

## 2013-04-17 ENCOUNTER — Other Ambulatory Visit (HOSPITAL_BASED_OUTPATIENT_CLINIC_OR_DEPARTMENT_OTHER): Payer: No Typology Code available for payment source | Admitting: Lab

## 2013-04-17 ENCOUNTER — Telehealth: Payer: Self-pay | Admitting: *Deleted

## 2013-04-17 ENCOUNTER — Ambulatory Visit (HOSPITAL_BASED_OUTPATIENT_CLINIC_OR_DEPARTMENT_OTHER): Payer: No Typology Code available for payment source | Admitting: Oncology

## 2013-04-17 ENCOUNTER — Other Ambulatory Visit: Payer: No Typology Code available for payment source | Admitting: Lab

## 2013-04-17 VITALS — BP 127/87 | HR 100 | Temp 98.1°F | Resp 18 | Ht 75.0 in | Wt 274.6 lb

## 2013-04-17 DIAGNOSIS — C888 Other malignant immunoproliferative diseases not having achieved remission: Secondary | ICD-10-CM

## 2013-04-17 DIAGNOSIS — Z5112 Encounter for antineoplastic immunotherapy: Secondary | ICD-10-CM

## 2013-04-17 DIAGNOSIS — K869 Disease of pancreas, unspecified: Secondary | ICD-10-CM

## 2013-04-17 DIAGNOSIS — C9022 Extramedullary plasmacytoma in relapse: Secondary | ICD-10-CM

## 2013-04-17 DIAGNOSIS — D729 Disorder of white blood cells, unspecified: Secondary | ICD-10-CM

## 2013-04-17 LAB — CBC WITH DIFFERENTIAL/PLATELET
BASO%: 0.2 % (ref 0.0–2.0)
Eosinophils Absolute: 0.1 10*3/uL (ref 0.0–0.5)
MCHC: 35.8 g/dL (ref 32.0–36.0)
MCV: 90.2 fL (ref 79.3–98.0)
MONO#: 0.3 10*3/uL (ref 0.1–0.9)
MONO%: 6.4 % (ref 0.0–14.0)
NEUT#: 3.8 10*3/uL (ref 1.5–6.5)
RBC: 4.35 10*6/uL (ref 4.20–5.82)
RDW: 14.2 % (ref 11.0–14.6)
WBC: 4.7 10*3/uL (ref 4.0–10.3)

## 2013-04-17 LAB — BASIC METABOLIC PANEL (CC13)
Glucose: 116 mg/dl (ref 70–140)
Potassium: 3.7 mEq/L (ref 3.5–5.1)
Sodium: 138 mEq/L (ref 136–145)

## 2013-04-17 MED ORDER — HYDROCODONE-ACETAMINOPHEN 7.5-325 MG PO TABS: 1.0000 | ORAL_TABLET | Freq: Four times a day (QID) | ORAL | Status: DC | PRN

## 2013-04-17 MED ORDER — BORTEZOMIB CHEMO SQ INJECTION 3.5 MG (2.5MG/ML)
1.3000 mg/m2 | Freq: Once | INTRAMUSCULAR | Status: AC
  Administered 2013-04-17: 3.25 mg via SUBCUTANEOUS
  Filled 2013-04-17: qty 3.25

## 2013-04-17 MED ORDER — ONDANSETRON HCL 8 MG PO TABS
8.0000 mg | ORAL_TABLET | Freq: Once | ORAL | Status: AC
  Administered 2013-04-17: 8 mg via ORAL

## 2013-04-17 NOTE — Patient Instructions (Signed)
[facility] Discharge Instructions for Patients Receiving Chemotherapy  Today you received the following chemotherapy agents: Velcade.  To help prevent nausea and vomiting after your treatment, we encourage you to take your nausea medication as prescribed.   If you develop nausea and vomiting that is not controlled by your nausea medication, call the clinic.   BELOW ARE SYMPTOMS THAT SHOULD BE REPORTED IMMEDIATELY:  *FEVER GREATER THAN 100.5 F  *CHILLS WITH OR WITHOUT FEVER  NAUSEA AND VOMITING THAT IS NOT CONTROLLED WITH YOUR NAUSEA MEDICATION  *UNUSUAL SHORTNESS OF BREATH  *UNUSUAL BRUISING OR BLEEDING  TENDERNESS IN MOUTH AND THROAT WITH OR WITHOUT PRESENCE OF ULCERS  *URINARY PROBLEMS  *BOWEL PROBLEMS  UNUSUAL RASH Items with * indicate a potential emergency and should be followed up as soon as possible.  Feel free to call the clinic you have any questions or concerns. The clinic phone number is [phone].    

## 2013-04-17 NOTE — Progress Notes (Signed)
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   PAST THERAPY: He completed 50.4 gray in 28 fractions on 04/08/2012  CURRENT THERAPY: started chemo Rev/Vel/Dex on 04/10/13.   INTERVAL HISTORY: Austin Griffith 52 y.o. male returns for regular follow up with his wife.  He started chemo last week.  He has not had problem.  He still has mild pain the left rib area which he only takes one or twice daily prn Lortab.  Pain is much better compared to two months ago.    Patient denies fever, anorexia, weight loss, fatigue, headache, visual changes, confusion, drenching night sweats, palpable lymph node swelling, mucositis, odynophagia, dysphagia, nausea vomiting, jaundice, chest pain, palpitation, shortness of breath, dyspnea on exertion, productive cough, gum bleeding, epistaxis, hematemesis, hemoptysis, abdominal pain, abdominal swelling, early satiety, melena, hematochezia, hematuria, skin rash, spontaneous bleeding, joint swelling, joint pain, heat or cold intolerance, bowel bladder incontinence, back pain, focal motor weakness, paresthesia.     Past Medical History  Diagnosis Date  . Hypertension   . Plasmacytoma      Right Ethmoid Sinus; bone marrow biopsy on 03/10/13 showed normal myeloma FISH panel and cytogenetics.   . Hyperlipidemia     diet control   . AVN (avascular necrosis of bone)     bilateral hips; s/p hip replacement in 1991 and 2009  . Headache(784.0) 02/08/2012    Right Sided Headache Behind Right Eye  . Epistaxis 02/08/2012  . Blurred vision     Present for "many years"  . Fatigue   . Acid reflux   . History of radiation therapy 02/29/12-04/08/12    right  ethmoid sinus in 50.4Gy in 20 fxs  . Fatigue 05/25/12    "Mild"  . Thrombocytopenia 05/25/12  . 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 Bilateral hip replacement  1991; and 2009    due to bilateral hip AVN's.   . Mass biopsy  01/26/12    Right Ethmoid Mass - Plasma Cell Neoplasm  . Eus N/A 03/24/2013    Procedure: UPPER ENDOSCOPIC ULTRASOUND (EUS) LINEAR;  Surgeon: Theda Belfast, MD;  Location: [facility];  Service: Endoscopy;  Laterality: N/A;  . Fine needle aspiration N/A 03/24/2013    Procedure: FINE NEEDLE ASPIRATION (FNA) LINEAR;  Surgeon: Theda Belfast, MD;  Location: [facility];  Service: Endoscopy;  Laterality: N/A;    Current Outpatient Prescriptions  Medication Sig Dispense Refill  . acyclovir (ZOVIRAX) 400 MG tablet Take 1 tablet (400 mg total) by mouth 2 (two) times daily.  60 tablet  3  . cycloSPORINE (RESTASIS) 0.05 % ophthalmic emulsion Place 1 drop into both eyes 2 (two) times daily.      Marland Kitchen dexamethasone (DECADRON) 4 MG tablet Take 40 mg by mouth once a week.      . enoxaparin (LOVENOX) 40 MG/0.4ML injection Inject 0.4 mLs (40 mg total) into the skin daily.  30 Syringe  3  . escitalopram (LEXAPRO) 20 MG tablet Take 10 mg by mouth daily.      . fluconazole (DIFLUCAN) 100 MG tablet Take 1 tablet (100 mg total) by mouth daily.  10 tablet  0  . HYDROcodone-acetaminophen (NORCO) 7.5-325 MG per tablet Take 1 tablet by mouth every 6 (six) hours as  needed for pain.  60 tablet  0  . ibuprofen (ADVIL,MOTRIN) 200 MG tablet Take 600 mg by mouth every 6 (six) hours as needed for pain.      Marland Kitchen lactulose (CHRONULAC) 10 GM/15ML solution Take 45 mLs (30 g total) by mouth 3 (three) times daily as needed (severe constipation.).  240 mL  0  . lansoprazole (PREVACID) 15 MG capsule Take 15 mg by mouth daily.      Marland Kitchen lenalidomide (REVLIMID) 25 MG capsule Take 1 capsule (25 mg total) by mouth daily.  21 capsule  0  . LORazepam (ATIVAN) 1 MG tablet Take 1 mg by mouth every 8 (eight) hours as needed for anxiety.       Marland Kitchen morphine (MS CONTIN)  15 MG 12 hr tablet Take 1 tablet (15 mg total) by mouth 2 (two) times daily.  60 tablet  0  . naproxen sodium (ALEVE) 220 MG tablet Take 220 mg by mouth 2 (two) times daily as needed (Pain).      Marland Kitchen olmesartan (BENICAR) 20 MG tablet Take 20 mg by mouth every morning.       . ondansetron (ZOFRAN) 8 MG tablet Take 1 tablet (8 mg total) by mouth every 8 (eight) hours as needed for nausea.  20 tablet  2  . prochlorperazine (COMPAZINE) 10 MG tablet Take 1 tablet (10 mg total) by mouth every 6 (six) hours as needed (Nausea or vomiting).  30 tablet  1  . triamcinolone (NASACORT) 55 MCG/ACT nasal inhaler Place 2 sprays into the nose daily.       No current facility-administered medications for this visit.    ALLERGIES:  has No Known Allergies.  REVIEW OF SYSTEMS:  The rest of the 14-point review of system was negative.   Filed Vitals:   04/17/13 0820  BP: 127/87  Pulse: 100  Temp: 98.1 F (36.7 C)  Resp: 18   Wt Readings from Last 3 Encounters:  04/17/13 274 lb 9.6 oz (124.558 kg)  04/10/13 273 lb 9.6 oz (124.104 kg)  03/30/13 276 lb 9.6 oz (125.465 kg)   ECOG Performance status: 0-1  PHYSICAL EXAMINATION:   General:  well-nourished man, in no acute distress.  Eyes:  no scleral icterus.  ENT:  Oropharynx was clear without any oral thrush.  Neck was without thyromegaly.  Lymphatics:  Negative cervical, supraclavicular or axillary adenopathy.  Respiratory: lungs were clear bilaterally without wheezing or crackles.  Cardiovascular:  Regular rate and rhythm, S1/S2, without murmur, rub or gallop.  There was no pedal edema.  GI:  abdomen was soft, flat, nontender, nondistended, without organomegaly.  Muscoloskeletal:  no spinal tenderness of palpation of vertebral spine.  Skin exam was without echymosis, petichae.  Neuro exam was nonfocal.  Patient was able to get on and off exam table without assistance.  Gait was normal.  Patient was alert and oriented.  Attention was good.   Language was  appropriate.  Mood was normal without depression.  Speech was not pressured.  Thought content was not tangential.       LABORATORY/RADIOLOGY DATA:  Lab Results  Component Value Date   WBC 4.7 04/17/2013   HGB 14.0 04/17/2013   HCT 39.2 04/17/2013   PLT 123* 04/17/2013   GLUCOSE 116 04/17/2013   ALKPHOS 91 03/30/2013   ALT 42 03/30/2013   AST 16 03/30/2013   NA 138 04/17/2013   K 3.7 04/17/2013   CL 105 03/30/2013   CREATININE 1.0 04/17/2013   BUN 10.6 04/17/2013  CO2 27 04/17/2013   INR 0.99 03/21/2013    ASSESSMENT AND PLAN:   1.  Recurrent multiple plasmacytoma:   - he is on cycle #1, day #8 of chemo today.  He has not had dose limiting toxicity.  I advised him to continue Velcade/Revlimid/Dex as previously prescribed.  - I advised him to continue Acyclovir to decrease risk of viral infection and Lovenox to decrease risk of thrombosis on Revlimid.  - He has eval for autoSCT with Dr. Donnie Coffin in a few weeks at [facility].   2.  Pancreas mass:  The head was biopsied and was plasmacytoma.  The tail was negative on biopsy.  We will continue to monitor the response with repeat PET scan.  3.  Potential liver masses:  Most likely plasmacytoma as well. Will continue to monitor this on follow up PET scan.   4.  For pain control: He has Vicodin prn every 8-12 hours.  I made this refill today.   5.   Follow up:  In about 1 week for cycle #1, day #15 of chemo.  I will see him later next week to assess response to chemo with serum light chain.      The length of time of the face-to-face encounter was 15 minutes. More than 50% of time was spent counseling and coordination of care.        Huan T. Gaylyn Rong, M.D.

## 2013-04-17 NOTE — Telephone Encounter (Signed)
gv and printed appt sched and avs for pt....MW added tx   °

## 2013-04-17 NOTE — Telephone Encounter (Signed)
Per staff phone call and POF I have schedueld appts.  JMW  

## 2013-04-24 ENCOUNTER — Ambulatory Visit (HOSPITAL_BASED_OUTPATIENT_CLINIC_OR_DEPARTMENT_OTHER): Payer: No Typology Code available for payment source

## 2013-04-24 ENCOUNTER — Other Ambulatory Visit (HOSPITAL_BASED_OUTPATIENT_CLINIC_OR_DEPARTMENT_OTHER): Payer: No Typology Code available for payment source | Admitting: Lab

## 2013-04-24 VITALS — BP 117/77 | HR 79 | Temp 97.7°F | Resp 18

## 2013-04-24 DIAGNOSIS — Z5112 Encounter for antineoplastic immunotherapy: Secondary | ICD-10-CM

## 2013-04-24 DIAGNOSIS — D729 Disorder of white blood cells, unspecified: Secondary | ICD-10-CM

## 2013-04-24 DIAGNOSIS — C888 Other malignant immunoproliferative diseases not having achieved remission: Secondary | ICD-10-CM

## 2013-04-24 DIAGNOSIS — C9022 Extramedullary plasmacytoma in relapse: Secondary | ICD-10-CM

## 2013-04-24 LAB — BASIC METABOLIC PANEL (CC13)
CO2: 25 mEq/L (ref 22–29)
Calcium: 9 mg/dL (ref 8.4–10.4)
Chloride: 102 mEq/L (ref 98–109)
Glucose: 133 mg/dl (ref 70–140)
Sodium: 139 mEq/L (ref 136–145)

## 2013-04-24 LAB — CBC WITH DIFFERENTIAL/PLATELET
BASO%: 0.2 % (ref 0.0–2.0)
Eosinophils Absolute: 0.1 10*3/uL (ref 0.0–0.5)
HCT: 33.7 % — ABNORMAL LOW (ref 38.4–49.9)
LYMPH%: 9.4 % — ABNORMAL LOW (ref 14.0–49.0)
MCHC: 35.2 g/dL (ref 32.0–36.0)
MONO#: 0.8 10*3/uL (ref 0.1–0.9)
NEUT#: 5.6 10*3/uL (ref 1.5–6.5)
Platelets: 132 10*3/uL — ABNORMAL LOW (ref 140–400)
RBC: 3.69 10*6/uL — ABNORMAL LOW (ref 4.20–5.82)
WBC: 7.1 10*3/uL (ref 4.0–10.3)
lymph#: 0.7 10*3/uL — ABNORMAL LOW (ref 0.9–3.3)

## 2013-04-24 MED ORDER — ONDANSETRON HCL 8 MG PO TABS
8.0000 mg | ORAL_TABLET | Freq: Once | ORAL | Status: AC
  Administered 2013-04-24: 8 mg via ORAL

## 2013-04-24 MED ORDER — BORTEZOMIB CHEMO SQ INJECTION 3.5 MG (2.5MG/ML)
1.3000 mg/m2 | Freq: Once | INTRAMUSCULAR | Status: AC
  Administered 2013-04-24: 3.25 mg via SUBCUTANEOUS
  Filled 2013-04-24: qty 3.25

## 2013-04-24 NOTE — Patient Instructions (Addendum)
[facility] Discharge Instructions for Patients Receiving Chemotherapy  Today you received the following chemotherapy agents :  Velcade.  To help prevent nausea and vomiting after your treatment, we encourage you to take your nausea medication as instructed by your physician.   If you develop nausea and vomiting that is not controlled by your nausea medication, call the clinic.   BELOW ARE SYMPTOMS THAT SHOULD BE REPORTED IMMEDIATELY:  *FEVER GREATER THAN 100.5 F  *CHILLS WITH OR WITHOUT FEVER  NAUSEA AND VOMITING THAT IS NOT CONTROLLED WITH YOUR NAUSEA MEDICATION  *UNUSUAL SHORTNESS OF BREATH  *UNUSUAL BRUISING OR BLEEDING  TENDERNESS IN MOUTH AND THROAT WITH OR WITHOUT PRESENCE OF ULCERS  *URINARY PROBLEMS  *BOWEL PROBLEMS  UNUSUAL RASH Items with * indicate a potential emergency and should be followed up as soon as possible.  Feel free to call the clinic you have any questions or concerns. The clinic phone number is [phone].    

## 2013-04-25 LAB — KAPPA/LAMBDA LIGHT CHAINS: Kappa:Lambda Ratio: 1.09 (ref 0.26–1.65)

## 2013-04-28 ENCOUNTER — Other Ambulatory Visit: Payer: No Typology Code available for payment source | Admitting: Lab

## 2013-04-28 ENCOUNTER — Telehealth: Payer: Self-pay | Admitting: *Deleted

## 2013-04-28 ENCOUNTER — Telehealth: Payer: Self-pay | Admitting: Oncology

## 2013-04-28 ENCOUNTER — Ambulatory Visit (HOSPITAL_BASED_OUTPATIENT_CLINIC_OR_DEPARTMENT_OTHER): Payer: No Typology Code available for payment source | Admitting: Oncology

## 2013-04-28 VITALS — BP 134/88 | HR 82 | Temp 98.4°F | Resp 18 | Ht 75.0 in | Wt 276.4 lb

## 2013-04-28 DIAGNOSIS — C9030 Solitary plasmacytoma not having achieved remission: Secondary | ICD-10-CM

## 2013-04-28 DIAGNOSIS — C9022 Extramedullary plasmacytoma in relapse: Secondary | ICD-10-CM

## 2013-04-28 NOTE — Telephone Encounter (Signed)
Per staff message and POF I have scheduled appts.  JMW  

## 2013-04-28 NOTE — Telephone Encounter (Signed)
gv and printed appt sched and avs for pt...emailed MW to add tx... °

## 2013-05-01 ENCOUNTER — Other Ambulatory Visit: Payer: No Typology Code available for payment source | Admitting: Lab

## 2013-05-01 ENCOUNTER — Ambulatory Visit: Payer: No Typology Code available for payment source

## 2013-05-02 ENCOUNTER — Telehealth: Payer: Self-pay | Admitting: Radiation Oncology

## 2013-05-02 ENCOUNTER — Other Ambulatory Visit: Payer: Self-pay | Admitting: *Deleted

## 2013-05-02 DIAGNOSIS — D729 Disorder of white blood cells, unspecified: Secondary | ICD-10-CM

## 2013-05-02 MED ORDER — LENALIDOMIDE 25 MG PO CAPS: 25.0000 mg | ORAL_CAPSULE | Freq: Every day | ORAL | Status: DC

## 2013-05-02 NOTE — Addendum Note (Signed)
Addended by: Arvilla Meres on: 05/02/2013 03:39 PM   Modules accepted: Orders

## 2013-05-02 NOTE — Telephone Encounter (Signed)
Faxed recon 03/14/13, PUT 03/28/13 to [facility], [phone].  OK per SES.  Received confirmation.

## 2013-05-02 NOTE — Telephone Encounter (Signed)
THIS REFILL REQUEST FOR REVLIMID WAS PLACED ON KRISTIN CURCIO,NP'S DESK.

## 2013-05-03 ENCOUNTER — Telehealth: Payer: Self-pay | Admitting: Radiation Oncology

## 2013-05-03 NOTE — Telephone Encounter (Signed)
Per SES, faxed EOT 03/28/13 to [facility], [phone].  Received confirmation.

## 2013-05-07 NOTE — Progress Notes (Signed)
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   PAST THERAPY: He completed 50.4 gray in 28 fractions on 04/08/2012  CURRENT THERAPY: started chemo Rev/Vel/Dex on 04/10/13.   INTERVAL HISTORY: Austin Griffith 52 y.o. male returns for regular follow up with his wife.  He reports doing well.  His rib pain is minimal and only comes on with heavy exertion.  He denied fever, mucositis, SOB, nausea/vomiting, bleeding, chest pain, PND, orthopnea, abdominal pain, neuropathy, lower extremity weakness, bowel/bladder incontinence, skin rash.  The rest of the 14-point review of system was negative.      Past Medical History  Diagnosis Date  . Hypertension   . Plasmacytoma      Right Ethmoid Sinus; bone marrow biopsy on 03/10/13 showed normal myeloma FISH panel and cytogenetics.   . Hyperlipidemia     diet control   . AVN (avascular necrosis of bone)     bilateral hips; s/p hip replacement in 1991 and 2009  . Headache(784.0) 02/08/2012    Right Sided Headache Behind Right Eye  . Epistaxis 02/08/2012  . Blurred vision     Present for "many years"  . Fatigue   . Acid reflux   . History of radiation therapy 02/29/12-04/08/12    right  ethmoid sinus in 50.4Gy in 20 fxs  . Fatigue 05/25/12    "Mild"  . Thrombocytopenia 05/25/12  . Extramedullary plasmacytoma in relapse 04/10/2013    Past Surgical History  Procedure Laterality Date  . Bilateral hip replacement  1991; and 2009    due to bilateral hip AVN's.   . Mass biopsy  01/26/12    Right Ethmoid Mass - Plasma Cell Neoplasm  . Eus N/A 03/24/2013    Procedure: UPPER ENDOSCOPIC ULTRASOUND (EUS) LINEAR;  Surgeon: Theda Belfast, MD;  Location: [facility];  Service: Endoscopy;  Laterality: N/A;  . Fine needle aspiration N/A 03/24/2013   Procedure: FINE NEEDLE ASPIRATION (FNA) LINEAR;  Surgeon: Theda Belfast, MD;  Location: [facility];  Service: Endoscopy;  Laterality: N/A;    Current Outpatient Prescriptions  Medication Sig Dispense Refill  . acyclovir (ZOVIRAX) 400 MG tablet Take 1 tablet (400 mg total) by mouth 2 (two) times daily.  60 tablet  3  . cycloSPORINE (RESTASIS) 0.05 % ophthalmic emulsion Place 1 drop into both eyes 2 (two) times daily.      Marland Kitchen dexamethasone (DECADRON) 4 MG tablet Take 40 mg by mouth once a week.      . enoxaparin (LOVENOX) 40 MG/0.4ML injection Inject 0.4 mLs (40 mg total) into the skin daily.  30 Syringe  3  . escitalopram (LEXAPRO) 20 MG tablet Take 10 mg by mouth daily.      . fluconazole (DIFLUCAN) 100 MG tablet Take 1 tablet (100 mg total) by mouth daily.  10 tablet  0  . HYDROcodone-acetaminophen (NORCO) 7.5-325 MG per tablet Take 1 tablet by mouth every 6 (six) hours as needed for pain.  60 tablet  0  . ibuprofen (ADVIL,MOTRIN) 200 MG tablet Take 600 mg by mouth every 6 (six) hours as needed for pain.      Marland Kitchen lactulose (CHRONULAC) 10 GM/15ML solution Take 45 mLs (30 g total) by mouth 3 (three) times daily as needed (severe constipation.).  240 mL  0  . lansoprazole (PREVACID) 15  MG capsule Take 15 mg by mouth daily.      Marland Kitchen LORazepam (ATIVAN) 1 MG tablet Take 1 mg by mouth every 8 (eight) hours as needed for anxiety.       Marland Kitchen morphine (MS CONTIN) 15 MG 12 hr tablet Take 1 tablet (15 mg total) by mouth 2 (two) times daily.  60 tablet  0  . naproxen sodium (ALEVE) 220 MG tablet Take 220 mg by mouth 2 (two) times daily as needed (Pain).      Marland Kitchen olmesartan (BENICAR) 20 MG tablet Take 20 mg by mouth every morning.       . ondansetron (ZOFRAN) 8 MG tablet Take 1 tablet (8 mg total) by mouth every 8 (eight) hours as needed for nausea.  20 tablet  2  . prochlorperazine (COMPAZINE) 10 MG tablet Take 1 tablet (10 mg total) by mouth every 6 (six) hours as needed (Nausea or vomiting).  30 tablet  1  .  triamcinolone (NASACORT) 55 MCG/ACT nasal inhaler Place 2 sprays into the nose daily.      Marland Kitchen lenalidomide (REVLIMID) 25 MG capsule Take 1 capsule (25 mg total) by mouth daily.  21 capsule  0   No current facility-administered medications for this visit.    ALLERGIES:  has No Known Allergies.  REVIEW OF SYSTEMS:  The rest of the 14-point review of system was negative.   Filed Vitals:   04/28/13 1240  BP: 134/88  Pulse: 82  Temp: 98.4 F (36.9 C)  Resp: 18   Wt Readings from Last 3 Encounters:  04/28/13 276 lb 6.4 oz (125.374 kg)  04/17/13 274 lb 9.6 oz (124.558 kg)  04/10/13 273 lb 9.6 oz (124.104 kg)   ECOG Performance status: 0-1  PHYSICAL EXAMINATION:   General:  well-nourished man, in no acute distress.  Eyes:  no scleral icterus.  ENT:  Oropharynx was clear without any oral thrush.  Neck was without thyromegaly.  Lymphatics:  Negative cervical, supraclavicular or axillary adenopathy.  Respiratory: lungs were clear bilaterally without wheezing or crackles.  Cardiovascular:  Regular rate and rhythm, S1/S2, without murmur, rub or gallop.  There was no pedal edema.  GI:  abdomen was soft, flat, nontender, nondistended, without organomegaly.  Muscoloskeletal:  no spinal tenderness of palpation of vertebral spine.  Skin exam was without echymosis, petichae.  Neuro exam was nonfocal.  Patient was able to get on and off exam table without assistance.  Gait was normal.  Patient was alert and oriented.  Attention was good.   Language was appropriate.  Mood was normal without depression.  Speech was not pressured.  Thought content was not tangential.       LABORATORY/RADIOLOGY DATA:  Lab Results  Component Value Date   WBC 7.1 04/24/2013   HGB 11.8* 04/24/2013   HCT 33.7* 04/24/2013   PLT 132* 04/24/2013   GLUCOSE 133 04/24/2013   ALKPHOS 91 03/30/2013   ALT 42 03/30/2013   AST 16 03/30/2013   NA 139 04/24/2013   K 3.7 04/24/2013   CL 105 03/30/2013   CREATININE 1.0 04/24/2013   BUN 11.4  04/24/2013   CO2 25 04/24/2013   INR 0.99 03/21/2013     I reviewed with the patient and his wife the resul t of his CBC, CMET and serum light chain.  ASSESSMENT AND PLAN:   1.  Recurrent multiple plasmacytoma:   - he is now of the week off of the 3 week on, 1 week off of Velcade/Revlimid/Dex chemo  today.  He has not had dose limiting toxicity. Per serum light chain, he has achieved very good partial response.  Since he had multiple[le plasmacytoma, PET scan would be required in addition to bone marrow biopsy to document depth of response prior to autoSCT if this is what he and BMT decide to do.  - I advised him to continue Acyclovir to decrease risk of viral infection and Lovenox to decrease risk of thrombosis on Revlimid.  - He has eval for autoSCT with Dr. Donnie Coffin in a few weeks at [facility].   2.  Pancreas mass:  The head was biopsied and was plasmacytoma.  The tail was negative on biopsy.  We will continue to monitor the response with repeat PET scan.  3.  Potential liver masses:  Most likely plasmacytoma as well. This will need to be monitored on follow up PET scan.   4.  For pain control: He has Vicodin prn every 8-12 hours.    5.   Follow up:  In about 1 week for cycle #2, day #1 of chemo.    I reminded Mr. Perleberg and his wife that today is my last day with the practice.  [facility] will [facility] for him to see another provider when he returns.   The length of time of the face-to-face encounter was 15 minutes. More than 50% of time was spent counseling and coordination of care.        Nathalya Wolanski T. Gaylyn Rong, M.D.

## 2013-05-08 ENCOUNTER — Ambulatory Visit: Payer: No Typology Code available for payment source

## 2013-05-08 ENCOUNTER — Telehealth: Payer: Self-pay | Admitting: *Deleted

## 2013-05-08 ENCOUNTER — Other Ambulatory Visit: Payer: No Typology Code available for payment source | Admitting: Lab

## 2013-05-08 NOTE — Telephone Encounter (Signed)
Wife called to report pt has "stomach virus" today w/ nausea/ vomiting and diarrhea.  No fevers.  He is taking his anti emetics and trying to push fluids.  Wife had stomach virus x 24 hrs a few days ago and she thinks he has the same thing now.  He cannot come in for Velcade today as scheduled and would like to r/s to tomorrow and r/s all subsequent visits to Tuesdays.   Tuesdays work better for pt and wife. Instructed wife to call back if pt's symptoms worsen or persist. She verbalized understanding.   POF sent to r/s all appts to Tuesdays including office visit in 2 weeks.  Left VM for Marcelino Duster in scheduling to r/s todays lab/chemo to tomorrow.

## 2013-05-08 NOTE — Telephone Encounter (Signed)
This is fine. Please be sure that all lab/visit/chemo appts are adjusted accordingly.

## 2013-05-08 NOTE — Telephone Encounter (Signed)
Patient's wife called and left voice message that patient is sick. They want to move appt from today to tomorrow. I have forwarded the message to the desk RN, sicne the treatment is weekly.

## 2013-05-09 ENCOUNTER — Other Ambulatory Visit (HOSPITAL_BASED_OUTPATIENT_CLINIC_OR_DEPARTMENT_OTHER): Payer: No Typology Code available for payment source | Admitting: Lab

## 2013-05-09 ENCOUNTER — Ambulatory Visit (HOSPITAL_BASED_OUTPATIENT_CLINIC_OR_DEPARTMENT_OTHER): Payer: No Typology Code available for payment source

## 2013-05-09 VITALS — BP 127/77 | HR 62 | Temp 98.6°F

## 2013-05-09 DIAGNOSIS — Z5112 Encounter for antineoplastic immunotherapy: Secondary | ICD-10-CM

## 2013-05-09 DIAGNOSIS — D729 Disorder of white blood cells, unspecified: Secondary | ICD-10-CM

## 2013-05-09 DIAGNOSIS — C888 Other malignant immunoproliferative diseases: Secondary | ICD-10-CM

## 2013-05-09 LAB — CBC WITH DIFFERENTIAL/PLATELET
BASO%: 1.5 % (ref 0.0–2.0)
Eosinophils Absolute: 0.1 10*3/uL (ref 0.0–0.5)
HCT: 33 % — ABNORMAL LOW (ref 38.4–49.9)
LYMPH%: 21.2 % (ref 14.0–49.0)
MCHC: 35.8 g/dL (ref 32.0–36.0)
MCV: 89.9 fL (ref 79.3–98.0)
MONO#: 0.6 10*3/uL (ref 0.1–0.9)
NEUT%: 63.9 % (ref 39.0–75.0)
Platelets: 151 10*3/uL (ref 140–400)
WBC: 5.3 10*3/uL (ref 4.0–10.3)

## 2013-05-09 LAB — BASIC METABOLIC PANEL (CC13)
CO2: 24 mEq/L (ref 22–29)
Chloride: 108 mEq/L (ref 98–109)
Glucose: 84 mg/dl (ref 70–140)
Potassium: 3.6 mEq/L (ref 3.5–5.1)
Sodium: 140 mEq/L (ref 136–145)

## 2013-05-09 MED ORDER — BORTEZOMIB CHEMO SQ INJECTION 3.5 MG (2.5MG/ML)
1.3000 mg/m2 | Freq: Once | INTRAMUSCULAR | Status: AC
  Administered 2013-05-09: 3.25 mg via SUBCUTANEOUS
  Filled 2013-05-09: qty 3.25

## 2013-05-09 MED ORDER — ONDANSETRON HCL 8 MG PO TABS
8.0000 mg | ORAL_TABLET | Freq: Once | ORAL | Status: AC
  Administered 2013-05-09: 8 mg via ORAL

## 2013-05-09 NOTE — Patient Instructions (Addendum)
[facility] Discharge Instructions for Patients Receiving Chemotherapy  Today you received the following chemotherapy agents velcade.  To help prevent nausea and vomiting after your treatment, we encourage you to take your nausea medication zofran or compazine.   If you develop nausea and vomiting that is not controlled by your nausea medication, call the clinic.   BELOW ARE SYMPTOMS THAT SHOULD BE REPORTED IMMEDIATELY:  *FEVER GREATER THAN 100.5 F  *CHILLS WITH OR WITHOUT FEVER  NAUSEA AND VOMITING THAT IS NOT CONTROLLED WITH YOUR NAUSEA MEDICATION  *UNUSUAL SHORTNESS OF BREATH  *UNUSUAL BRUISING OR BLEEDING  TENDERNESS IN MOUTH AND THROAT WITH OR WITHOUT PRESENCE OF ULCERS  *URINARY PROBLEMS  *BOWEL PROBLEMS  UNUSUAL RASH Items with * indicate a potential emergency and should be followed up as soon as possible.  Feel free to call the clinic you have any questions or concerns. The clinic phone number is [phone].

## 2013-05-11 ENCOUNTER — Encounter: Payer: Self-pay | Admitting: Radiation Oncology

## 2013-05-12 ENCOUNTER — Ambulatory Visit
Admission: RE | Admit: 2013-05-12 | Payer: No Typology Code available for payment source | Source: Ambulatory Visit | Admitting: Radiation Oncology

## 2013-05-12 HISTORY — DX: Disease of pancreas, unspecified: K86.9

## 2013-05-12 HISTORY — DX: Long term (current) use of other immunomodulators and immunosuppressants: Z79.69

## 2013-05-12 HISTORY — DX: Secondary malignant neoplasm of bone: C79.51

## 2013-05-12 HISTORY — DX: Other long term (current) drug therapy: Z79.899

## 2013-05-12 HISTORY — DX: Liver disease, unspecified: K76.9

## 2013-05-15 ENCOUNTER — Ambulatory Visit: Payer: No Typology Code available for payment source

## 2013-05-15 ENCOUNTER — Other Ambulatory Visit: Payer: No Typology Code available for payment source | Admitting: Lab

## 2013-05-16 ENCOUNTER — Ambulatory Visit (HOSPITAL_BASED_OUTPATIENT_CLINIC_OR_DEPARTMENT_OTHER): Payer: No Typology Code available for payment source

## 2013-05-16 ENCOUNTER — Other Ambulatory Visit (HOSPITAL_BASED_OUTPATIENT_CLINIC_OR_DEPARTMENT_OTHER): Payer: No Typology Code available for payment source | Admitting: Lab

## 2013-05-16 VITALS — BP 136/88 | HR 91 | Temp 99.6°F | Resp 18

## 2013-05-16 DIAGNOSIS — C888 Other malignant immunoproliferative diseases: Secondary | ICD-10-CM

## 2013-05-16 DIAGNOSIS — D729 Disorder of white blood cells, unspecified: Secondary | ICD-10-CM

## 2013-05-16 DIAGNOSIS — Z5112 Encounter for antineoplastic immunotherapy: Secondary | ICD-10-CM

## 2013-05-16 LAB — CBC WITH DIFFERENTIAL/PLATELET
BASO%: 0.6 % (ref 0.0–2.0)
EOS%: 1.7 % (ref 0.0–7.0)
HCT: 37.2 % — ABNORMAL LOW (ref 38.4–49.9)
LYMPH%: 8.7 % — ABNORMAL LOW (ref 14.0–49.0)
MCH: 32.3 pg (ref 27.2–33.4)
MCHC: 35.3 g/dL (ref 32.0–36.0)
NEUT%: 85.9 % — ABNORMAL HIGH (ref 39.0–75.0)
Platelets: 116 10*3/uL — ABNORMAL LOW (ref 140–400)

## 2013-05-16 LAB — BASIC METABOLIC PANEL (CC13)
Calcium: 9 mg/dL (ref 8.4–10.4)
Creatinine: 0.9 mg/dL (ref 0.7–1.3)

## 2013-05-16 MED ORDER — BORTEZOMIB CHEMO SQ INJECTION 3.5 MG (2.5MG/ML)
1.3000 mg/m2 | Freq: Once | INTRAMUSCULAR | Status: AC
  Administered 2013-05-16: 3.25 mg via SUBCUTANEOUS
  Filled 2013-05-16: qty 3.25

## 2013-05-16 MED ORDER — ONDANSETRON HCL 8 MG PO TABS
8.0000 mg | ORAL_TABLET | Freq: Once | ORAL | Status: AC
  Administered 2013-05-16: 8 mg via ORAL

## 2013-05-16 NOTE — Patient Instructions (Signed)

## 2013-05-17 ENCOUNTER — Encounter: Payer: Self-pay | Admitting: Radiation Oncology

## 2013-05-19 ENCOUNTER — Ambulatory Visit
Admission: RE | Admit: 2013-05-19 | Payer: No Typology Code available for payment source | Source: Ambulatory Visit | Admitting: Radiation Oncology

## 2013-05-19 ENCOUNTER — Telehealth: Payer: Self-pay | Admitting: Hematology and Oncology

## 2013-05-19 HISTORY — DX: Personal history of irradiation: Z92.3

## 2013-05-19 NOTE — Telephone Encounter (Signed)
pt called and wanted to move nxt vist to 8.14.14...done...emailed MB to move tx from 8.12

## 2013-05-22 ENCOUNTER — Other Ambulatory Visit: Payer: Self-pay

## 2013-05-22 ENCOUNTER — Ambulatory Visit: Payer: No Typology Code available for payment source

## 2013-05-22 ENCOUNTER — Other Ambulatory Visit: Payer: No Typology Code available for payment source | Admitting: Lab

## 2013-05-22 DIAGNOSIS — C9022 Extramedullary plasmacytoma in relapse: Secondary | ICD-10-CM

## 2013-05-22 DIAGNOSIS — D729 Disorder of white blood cells, unspecified: Secondary | ICD-10-CM

## 2013-05-22 MED ORDER — HYDROCODONE-ACETAMINOPHEN 7.5-325 MG PO TABS: 1.0000 | ORAL_TABLET | Freq: Four times a day (QID) | ORAL | Status: DC | PRN

## 2013-05-23 ENCOUNTER — Telehealth: Payer: Self-pay | Admitting: *Deleted

## 2013-05-23 ENCOUNTER — Other Ambulatory Visit: Payer: No Typology Code available for payment source | Admitting: Lab

## 2013-05-23 ENCOUNTER — Other Ambulatory Visit: Payer: No Typology Code available for payment source

## 2013-05-23 ENCOUNTER — Ambulatory Visit: Payer: No Typology Code available for payment source | Admitting: Lab

## 2013-05-23 ENCOUNTER — Ambulatory Visit: Payer: No Typology Code available for payment source

## 2013-05-23 NOTE — Telephone Encounter (Signed)
Note on nurse's desk asking to add lab appt on before Velcade appt today.  Pt added on for lab at 10:15 am by Selena Batten in Lab.  Called pt and s/w wife who understands for pt to come at 10:15 for lab prior to chemo appt.

## 2013-05-23 NOTE — Telephone Encounter (Signed)
Notified wife of refill for Hydrocodone waiting for pick up as requested.  Instructed pt can pick up when he comes in for chemo appt today.  She verbalized understanding.

## 2013-05-23 NOTE — Telephone Encounter (Signed)
Per staff message and POF I have scheduled appts. Advised scheduler tro move lab   JMW

## 2013-05-23 NOTE — Telephone Encounter (Signed)
Per staff message I have moved appt from today to tomorrow 

## 2013-05-24 ENCOUNTER — Other Ambulatory Visit (HOSPITAL_BASED_OUTPATIENT_CLINIC_OR_DEPARTMENT_OTHER): Payer: No Typology Code available for payment source | Admitting: Lab

## 2013-05-24 ENCOUNTER — Ambulatory Visit (HOSPITAL_BASED_OUTPATIENT_CLINIC_OR_DEPARTMENT_OTHER): Payer: No Typology Code available for payment source

## 2013-05-24 VITALS — BP 145/93 | HR 96 | Temp 98.4°F | Resp 20

## 2013-05-24 DIAGNOSIS — D729 Disorder of white blood cells, unspecified: Secondary | ICD-10-CM

## 2013-05-24 DIAGNOSIS — C888 Other malignant immunoproliferative diseases: Secondary | ICD-10-CM

## 2013-05-24 DIAGNOSIS — Z5112 Encounter for antineoplastic immunotherapy: Secondary | ICD-10-CM

## 2013-05-24 DIAGNOSIS — C9022 Extramedullary plasmacytoma in relapse: Secondary | ICD-10-CM

## 2013-05-24 LAB — COMPREHENSIVE METABOLIC PANEL (CC13)
Albumin: 3.2 g/dL — ABNORMAL LOW (ref 3.5–5.0)
BUN: 8.2 mg/dL (ref 7.0–26.0)
CO2: 25 mEq/L (ref 22–29)
Calcium: 8.8 mg/dL (ref 8.4–10.4)
Chloride: 105 mEq/L (ref 98–109)
Creatinine: 1 mg/dL (ref 0.7–1.3)
Glucose: 113 mg/dl (ref 70–140)
Potassium: 3.4 mEq/L — ABNORMAL LOW (ref 3.5–5.1)

## 2013-05-24 LAB — CBC WITH DIFFERENTIAL/PLATELET
Basophils Absolute: 0 10*3/uL (ref 0.0–0.1)
EOS%: 7.3 % — ABNORMAL HIGH (ref 0.0–7.0)
Eosinophils Absolute: 0.3 10*3/uL (ref 0.0–0.5)
HGB: 13 g/dL (ref 13.0–17.1)
NEUT#: 2.8 10*3/uL (ref 1.5–6.5)
RDW: 14.2 % (ref 11.0–14.6)
lymph#: 0.8 10*3/uL — ABNORMAL LOW (ref 0.9–3.3)

## 2013-05-24 MED ORDER — ONDANSETRON HCL 8 MG PO TABS
8.0000 mg | ORAL_TABLET | Freq: Once | ORAL | Status: AC
  Administered 2013-05-24: 8 mg via ORAL

## 2013-05-24 MED ORDER — BORTEZOMIB CHEMO SQ INJECTION 3.5 MG (2.5MG/ML)
1.3000 mg/m2 | Freq: Once | INTRAMUSCULAR | Status: AC
  Administered 2013-05-24: 3.25 mg via SUBCUTANEOUS
  Filled 2013-05-24: qty 3.25

## 2013-05-25 ENCOUNTER — Ambulatory Visit: Payer: No Typology Code available for payment source

## 2013-05-25 ENCOUNTER — Ambulatory Visit: Payer: No Typology Code available for payment source | Admitting: Hematology and Oncology

## 2013-05-25 ENCOUNTER — Other Ambulatory Visit: Payer: No Typology Code available for payment source | Admitting: Lab

## 2013-05-25 LAB — KAPPA/LAMBDA LIGHT CHAINS
Kappa free light chain: 3.26 mg/dL — ABNORMAL HIGH (ref 0.33–1.94)
Kappa:Lambda Ratio: 0.81 (ref 0.26–1.65)
Lambda Free Lght Chn: 4.03 mg/dL — ABNORMAL HIGH (ref 0.57–2.63)

## 2013-05-26 ENCOUNTER — Other Ambulatory Visit: Payer: Self-pay | Admitting: *Deleted

## 2013-05-30 ENCOUNTER — Other Ambulatory Visit: Payer: Self-pay | Admitting: *Deleted

## 2013-05-30 DIAGNOSIS — D729 Disorder of white blood cells, unspecified: Secondary | ICD-10-CM

## 2013-05-30 NOTE — Telephone Encounter (Signed)
THIS REFILL REQUEST FOR REVLIMID WAS GIVEN TO DR.ROSTAPSHOV'S NURSE, CAMEO PORTER,RN. 

## 2013-05-31 ENCOUNTER — Other Ambulatory Visit: Payer: Self-pay | Admitting: *Deleted

## 2013-05-31 MED ORDER — LORAZEPAM 1 MG PO TABS: 1.0000 mg | ORAL_TABLET | Freq: Three times a day (TID) | ORAL | Status: DC | PRN

## 2013-05-31 MED ORDER — LENALIDOMIDE 25 MG PO CAPS: 25.0000 mg | ORAL_CAPSULE | Freq: Every day | ORAL | Status: DC

## 2013-05-31 NOTE — Addendum Note (Signed)
Addended by: Laroy Apple E on: 05/31/2013 03:07 PM   Modules accepted: Orders

## 2013-05-31 NOTE — Telephone Encounter (Signed)
R/S appt from last week. Needs to be seen to set up next round of chemo. Patient has seen Dr Donnie Coffin last week.

## 2013-06-04 ENCOUNTER — Telehealth: Payer: Self-pay | Admitting: Hematology and Oncology

## 2013-06-04 NOTE — Telephone Encounter (Signed)
Per 8/19 pof lmonvm for wife Marylene Land re appts for 8/26.

## 2013-06-05 ENCOUNTER — Other Ambulatory Visit: Payer: No Typology Code available for payment source | Admitting: Lab

## 2013-06-05 NOTE — Telephone Encounter (Signed)
RECEIVED A FAX FROM [employer] CONCERNING A CONFIRMATION OF PRESCRIPTION SHIPMENT FOR REVLIMID ON 06/02/13.

## 2013-06-06 ENCOUNTER — Telehealth: Payer: Self-pay | Admitting: *Deleted

## 2013-06-06 ENCOUNTER — Other Ambulatory Visit (HOSPITAL_BASED_OUTPATIENT_CLINIC_OR_DEPARTMENT_OTHER): Payer: No Typology Code available for payment source | Admitting: Lab

## 2013-06-06 ENCOUNTER — Ambulatory Visit (HOSPITAL_BASED_OUTPATIENT_CLINIC_OR_DEPARTMENT_OTHER): Payer: No Typology Code available for payment source

## 2013-06-06 ENCOUNTER — Telehealth: Payer: Self-pay | Admitting: Hematology and Oncology

## 2013-06-06 ENCOUNTER — Ambulatory Visit (HOSPITAL_BASED_OUTPATIENT_CLINIC_OR_DEPARTMENT_OTHER): Payer: No Typology Code available for payment source | Admitting: Hematology and Oncology

## 2013-06-06 VITALS — BP 114/71 | HR 78 | Temp 98.3°F | Resp 20 | Ht 75.0 in | Wt 271.6 lb

## 2013-06-06 DIAGNOSIS — Z5112 Encounter for antineoplastic immunotherapy: Secondary | ICD-10-CM

## 2013-06-06 DIAGNOSIS — C888 Other malignant immunoproliferative diseases not having achieved remission: Secondary | ICD-10-CM

## 2013-06-06 DIAGNOSIS — D729 Disorder of white blood cells, unspecified: Secondary | ICD-10-CM

## 2013-06-06 DIAGNOSIS — Z923 Personal history of irradiation: Secondary | ICD-10-CM

## 2013-06-06 DIAGNOSIS — C9022 Extramedullary plasmacytoma in relapse: Secondary | ICD-10-CM

## 2013-06-06 DIAGNOSIS — K769 Liver disease, unspecified: Secondary | ICD-10-CM

## 2013-06-06 DIAGNOSIS — D7289 Other specified disorders of white blood cells: Secondary | ICD-10-CM

## 2013-06-06 LAB — CBC WITH DIFFERENTIAL/PLATELET
BASO%: 2.1 % — ABNORMAL HIGH (ref 0.0–2.0)
EOS%: 1.4 % (ref 0.0–7.0)
HCT: 35.9 % — ABNORMAL LOW (ref 38.4–49.9)
MCH: 32.6 pg (ref 27.2–33.4)
MCHC: 35.2 g/dL (ref 32.0–36.0)
MONO#: 0.6 10*3/uL (ref 0.1–0.9)
NEUT%: 61.3 % (ref 39.0–75.0)
RBC: 3.88 10*6/uL — ABNORMAL LOW (ref 4.20–5.82)
WBC: 4.9 10*3/uL (ref 4.0–10.3)
lymph#: 1.1 10*3/uL (ref 0.9–3.3)

## 2013-06-06 LAB — BASIC METABOLIC PANEL (CC13)
CO2: 23 mEq/L (ref 22–29)
Calcium: 8.8 mg/dL (ref 8.4–10.4)
Chloride: 107 mEq/L (ref 98–109)
Creatinine: 0.9 mg/dL (ref 0.7–1.3)
Sodium: 139 mEq/L (ref 136–145)

## 2013-06-06 MED ORDER — BORTEZOMIB CHEMO SQ INJECTION 3.5 MG (2.5MG/ML)
1.3000 mg/m2 | Freq: Once | INTRAMUSCULAR | Status: AC
  Administered 2013-06-06: 3.25 mg via SUBCUTANEOUS
  Filled 2013-06-06: qty 3.25

## 2013-06-06 MED ORDER — ONDANSETRON HCL 8 MG PO TABS
8.0000 mg | ORAL_TABLET | Freq: Once | ORAL | Status: AC
  Administered 2013-06-06: 8 mg via ORAL

## 2013-06-06 NOTE — Telephone Encounter (Signed)
Per staff message and POF I have scheduled appts.  JMW  

## 2013-06-06 NOTE — Progress Notes (Signed)
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   PAST THERAPY: He completed 50.4 gray in 28 fractions on 04/08/2012   CURRENT THERAPY: started chemo Rev/Vel/Dex on 04/10/13.   INTERVAL HISTORY:  Austin Griffith 52 y.o. male returns for regular follow up visit with his wife. He reports doing well. His appetite is good and weight is stable.  Patient has blurry vision since radiation. He has some bloody nasal discharge. He feels fatigue. The patient denied fever, chills, night sweats.\ He denied headaches, double vision, nasal congestion, hearing problems, odynophagia or dysphagia. No chest pain, palpitations, dyspnea, cough, abdominal pain, nausea, vomiting, diarrhea, constipation, hematochezia. The patient denied dysuria, nocturia, polyuria, hematuria, myalgia, numbness, tingling, psychiatric problems.  Review of Systems  Constitutional: Positive for malaise/fatigue. Negative for fever, chills, weight loss and diaphoresis.  HENT: Positive for nosebleeds. Negative for hearing loss, congestion, sore throat, neck pain, tinnitus and ear discharge.   Eyes: Positive for blurred vision. Negative for double vision, photophobia and pain.  Respiratory: Negative for cough, hemoptysis, sputum production, shortness of breath and wheezing.   Cardiovascular: Negative for chest pain, palpitations, orthopnea, leg swelling and PND.  Gastrointestinal: Negative for heartburn, nausea, vomiting, abdominal pain, diarrhea, constipation and blood in stool.  Genitourinary: Negative for dysuria, urgency, frequency and hematuria.  Musculoskeletal: Negative for myalgias, back pain and joint pain.  Skin: Negative for itching and rash.  Neurological: Negative  for dizziness, tingling, sensory change, seizures, loss of consciousness, weakness and headaches.  Endo/Heme/Allergies: Does not bruise/bleed easily.  Psychiatric/Behavioral: Negative.     PAST MEDICAL HISTORY: Past Medical History  Diagnosis Date  . Hypertension   . Plasmacytoma      Right Ethmoid Sinus; bone marrow biopsy on 03/10/13 showed normal myeloma FISH panel and cytogenetics.   . Hyperlipidemia     diet control   . AVN (avascular necrosis of bone)     bilateral hips; s/p hip replacement in 1991 and 2009  . Headache(784.0) 02/08/2012    Right Sided Headache Behind Right Eye  . Epistaxis 02/08/2012  . Blurred vision     Present for "many years"  . Fatigue   . Acid reflux   . History of radiation therapy 02/29/12-04/08/12    right  ethmoid sinus in 50.4Gy in 20 fxs  . Fatigue 05/25/12    "Mild"  . Thrombocytopenia 05/25/12  . 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 Bone metastases 03/31/13     MR Abdomen -Lower Thoracic and Upper Lumbar spine  . Liver lesion 03/31/13    MR Abdomen  . Pancreatic lesion 03/31/13     MR Abdomen - Ucinate Process  . On antineoplastic chemotherapy started 04/08/13    Revlimid/ Velcade/ Dex  . S/P radiation therapy 03/28/2013    Left posterior 7th Rib / 8Gy in 1 fraction    PAST SURGICAL HISTORY: Past Surgical History  Procedure Laterality Date  . Bilateral hip replacement  1991; and 2009    due to bilateral hip AVN's.   . Mass biopsy  01/26/12    Right Ethmoid Mass - Plasma Cell Neoplasm  . Eus N/A 03/24/2013    Procedure: UPPER ENDOSCOPIC ULTRASOUND (EUS) LINEAR;  Surgeon: Theda Belfast, MD;  Location: [facility];  Service: Endoscopy;  Laterality: N/A;  .  Fine needle aspiration N/A 03/24/2013    Procedure: FINE NEEDLE ASPIRATION (FNA) LINEAR;  Surgeon: Theda Belfast, MD;  Location: [facility];  Service: Endoscopy;  Laterality: N/A;    FAMILY HISTORY Family History  Problem Relation Age of Onset  . Adopted: Yes     HEALTH MAINTENANCE: History  Substance Use Topics  . Smoking status: Never Smoker   . Smokeless tobacco: Never Used  . Alcohol Use: No     Comment: Hx of Alcohol Abuse. Reported No further intake on 05/25/12    No Known Allergies  Current Outpatient Prescriptions  Medication Sig Dispense Refill  . acyclovir (ZOVIRAX) 400 MG tablet Take 1 tablet (400 mg total) by mouth 2 (two) times daily.  60 tablet  3  . cycloSPORINE (RESTASIS) 0.05 % ophthalmic emulsion Place 1 drop into both eyes 2 (two) times daily.      Marland Kitchen dexamethasone (DECADRON) 4 MG tablet Take 40 mg by mouth once a week.      . enoxaparin (LOVENOX) 40 MG/0.4ML injection Inject 0.4 mLs (40 mg total) into the skin daily.  30 Syringe  3  . escitalopram (LEXAPRO) 20 MG tablet Take 10 mg by mouth daily.      . fluconazole (DIFLUCAN) 100 MG tablet Take 1 tablet (100 mg total) by mouth daily.  10 tablet  0  . HYDROcodone-acetaminophen (NORCO) 7.5-325 MG per tablet Take 1 tablet by mouth every 6 (six) hours as needed for pain.  60 tablet  0  . ibuprofen (ADVIL,MOTRIN) 200 MG tablet Take 600 mg by mouth every 6 (six) hours as needed for pain.      Marland Kitchen lactulose (CHRONULAC) 10 GM/15ML solution Take 45 mLs (30 g total) by mouth 3 (three) times daily as needed (severe constipation.).  240 mL  0  . lansoprazole (PREVACID) 15 MG capsule Take 15 mg by mouth daily.      Marland Kitchen lenalidomide (REVLIMID) 25 MG capsule Take 1 capsule (25 mg total) by mouth daily.  21 capsule  0  . LORazepam (ATIVAN) 1 MG tablet Take 1 tablet (1 mg total) by mouth every 8 (eight) hours as needed for anxiety.  30 tablet  1  . naproxen sodium (ALEVE) 220 MG tablet Take 220 mg by mouth 2 (two) times daily as needed (Pain).      Marland Kitchen olmesartan (BENICAR) 20 MG tablet Take 20 mg by mouth every morning.       . ondansetron (ZOFRAN) 8 MG tablet Take 1 tablet (8 mg total) by mouth every 8 (eight) hours as needed for nausea.  20 tablet  2  . prochlorperazine (COMPAZINE) 10 MG tablet  Take 1 tablet (10 mg total) by mouth every 6 (six) hours as needed (Nausea or vomiting).  30 tablet  1  . triamcinolone (NASACORT) 55 MCG/ACT nasal inhaler Place 2 sprays into the nose daily.       No current facility-administered medications for this visit.    OBJECTIVE: Filed Vitals:   06/06/13 1151  BP: 114/71  Pulse: 78  Temp: 98.3 F (36.8 C)  Resp: 20     Body mass index is 33.95 kg/(m^2).    ECOG FS: 0  PHYSICAL EXAMINATION:  HEENT: Sclerae anicteric.  Conjunctivae were pink. Pupils round and reactive bilaterally. Oral mucosa is moist without ulceration or thrush. No occipital, submandibular, cervical, supraclavicular or axillar adenopathy. Lungs: clear to auscultation without wheezes. No rales or rhonchi. Heart: regular rate and rhythm. No murmur, gallop or rubs. Abdomen: soft, non  tender. No guarding or rebound tenderness. Bowel sounds are present. No palpable hepatosplenomegaly. MSK: no focal spinal tenderness. Extremities: No clubbing or cyanosis.No calf tenderness to palpitation, no peripheral edema. The patient had grossly intact strength in upper and lower extremities. Skin exam was without ecchymosis, petechiae. Neuro: non-focal, alert and oriented to time, person and place, appropriate affect  LAB RESULTS:  CMP     Component Value Date/Time   NA 139 06/06/2013 1140   NA 138 03/03/2013 1434   NA 138 05/20/2012 1105   K 3.5 06/06/2013 1140   K 4.5 03/03/2013 1434   K 4.5 05/20/2012 1105   CL 105 03/30/2013 1340   CL 101 03/03/2013 1434   CL 100 05/20/2012 1105   CO2 23 06/06/2013 1140   CO2 27 03/03/2013 1434   CO2 27 05/20/2012 1105   GLUCOSE 95 06/06/2013 1140   GLUCOSE 120* 03/30/2013 1340   GLUCOSE 88 03/03/2013 1434   GLUCOSE 110 05/20/2012 1105   BUN 8.6 06/06/2013 1140   BUN 16 03/03/2013 1434   BUN 15 05/20/2012 1105   CREATININE 0.9 06/06/2013 1140   CREATININE 1.05 03/03/2013 1434   CREATININE 1.0 05/20/2012 1105   CALCIUM 8.8 06/06/2013 1140   CALCIUM 9.4 03/03/2013  1434   CALCIUM 9.0 05/20/2012 1105   PROT 6.8 05/24/2013 1317   PROT 7.1 03/03/2013 1434   PROT 6.8 05/20/2012 1105   ALBUMIN 3.2* 05/24/2013 1317   ALBUMIN 4.1 03/03/2013 1434   AST 15 05/24/2013 1317   AST 37 03/03/2013 1434   AST 30 05/20/2012 1105   ALT 31 05/24/2013 1317   ALT 51 03/03/2013 1434   ALT 52* 05/20/2012 1105   ALKPHOS 109 05/24/2013 1317   ALKPHOS 91 03/03/2013 1434   ALKPHOS 58 05/20/2012 1105   BILITOT 1.20 05/24/2013 1317   BILITOT 0.8 03/03/2013 1434   BILITOT 0.90 05/20/2012 1105   GFRNONAA 82* 03/03/2013 1434   GFRAA >90 03/03/2013 1434   Lab Results  Component Value Date   WBC 4.9 06/06/2013   NEUTROABS 3.0 06/06/2013   HGB 12.7* 06/06/2013   HCT 35.9* 06/06/2013   MCV 92.5 06/06/2013   PLT 172 06/06/2013      Chemistry      Component Value Date/Time   NA 139 06/06/2013 1140   NA 138 03/03/2013 1434   NA 138 05/20/2012 1105   K 3.5 06/06/2013 1140   K 4.5 03/03/2013 1434   K 4.5 05/20/2012 1105   CL 105 03/30/2013 1340   CL 101 03/03/2013 1434   CL 100 05/20/2012 1105   CO2 23 06/06/2013 1140   CO2 27 03/03/2013 1434   CO2 27 05/20/2012 1105   BUN 8.6 06/06/2013 1140   BUN 16 03/03/2013 1434   BUN 15 05/20/2012 1105   CREATININE 0.9 06/06/2013 1140   CREATININE 1.05 03/03/2013 1434   CREATININE 1.0 05/20/2012 1105      Component Value Date/Time   CALCIUM 8.8 06/06/2013 1140   CALCIUM 9.4 03/03/2013 1434   CALCIUM 9.0 05/20/2012 1105   ALKPHOS 109 05/24/2013 1317   ALKPHOS 91 03/03/2013 1434   ALKPHOS 58 05/20/2012 1105   AST 15 05/24/2013 1317   AST 37 03/03/2013 1434   AST 30 05/20/2012 1105   ALT 31 05/24/2013 1317   ALT 51 03/03/2013 1434   ALT 52* 05/20/2012 1105   BILITOT 1.20 05/24/2013 1317   BILITOT 0.8 03/03/2013 1434   BILITOT 0.90 05/20/2012 1105     ASSESSMENT AND  PLAN: 1. Recurrent multiple plasmacytoma:  - patient  is  Velcade/Revlimid/Dex chemo ( 3 week on, 1 week off)  He has not had dose limiting toxicity. PET scan would be required in addition to bone marrow biopsy to  document depth of response prior to autoSCT if this is what he and BMT decide to do.  He was evaluated for autoSCT with Dr. Donnie Coffin at [facility] and had follow up visit on September 20. -  he is on Acyclovir and Lovenox.  -  proceed with weekly Velcade treatment for 3 weeks with CBC prior treatment. -  patient started Revlimid yesterday. - Proceed with Decadron. 2. Pancreas mass: The head was biopsied and was plasmacytoma. The tail was negative on biopsy. We will continue to monitor the response with repeat PET scan.  3. Potential liver masses: Most likely plasmacytoma as well. This will need to be monitored on follow up PET scan.  4. Follow up: In 4 weeks for next cycle  of chemo.   The length of time of the face-to-face encounter was 15 minutes. More than 50% of time was spent counseling and coordination of care.   Myra Rude, MD   06/06/2013 12:41 PM

## 2013-06-06 NOTE — Telephone Encounter (Signed)
gv and printed appt sched and avs for pt...emailed MW to add tx... °

## 2013-06-06 NOTE — Patient Instructions (Addendum)
[facility] Discharge Instructions for Patients Receiving Chemotherapy  Today you received the following chemotherapy agents: Velcade.  To help prevent nausea and vomiting after your treatment, we encourage you to take your nausea medication as prescribed.   If you develop nausea and vomiting that is not controlled by your nausea medication, call the clinic.   BELOW ARE SYMPTOMS THAT SHOULD BE REPORTED IMMEDIATELY:  *FEVER GREATER THAN 100.5 F  *CHILLS WITH OR WITHOUT FEVER  NAUSEA AND VOMITING THAT IS NOT CONTROLLED WITH YOUR NAUSEA MEDICATION  *UNUSUAL SHORTNESS OF BREATH  *UNUSUAL BRUISING OR BLEEDING  TENDERNESS IN MOUTH AND THROAT WITH OR WITHOUT PRESENCE OF ULCERS  *URINARY PROBLEMS  *BOWEL PROBLEMS  UNUSUAL RASH Items with * indicate a potential emergency and should be followed up as soon as possible.  Feel free to call the clinic you have any questions or concerns. The clinic phone number is [phone].    

## 2013-06-13 ENCOUNTER — Other Ambulatory Visit (HOSPITAL_BASED_OUTPATIENT_CLINIC_OR_DEPARTMENT_OTHER): Payer: No Typology Code available for payment source | Admitting: Lab

## 2013-06-13 ENCOUNTER — Ambulatory Visit (HOSPITAL_BASED_OUTPATIENT_CLINIC_OR_DEPARTMENT_OTHER): Payer: No Typology Code available for payment source

## 2013-06-13 VITALS — BP 109/69 | HR 70 | Temp 98.7°F

## 2013-06-13 DIAGNOSIS — C9022 Extramedullary plasmacytoma in relapse: Secondary | ICD-10-CM

## 2013-06-13 DIAGNOSIS — D729 Disorder of white blood cells, unspecified: Secondary | ICD-10-CM

## 2013-06-13 DIAGNOSIS — C888 Other malignant immunoproliferative diseases not having achieved remission: Secondary | ICD-10-CM

## 2013-06-13 DIAGNOSIS — Z5112 Encounter for antineoplastic immunotherapy: Secondary | ICD-10-CM

## 2013-06-13 LAB — CBC WITH DIFFERENTIAL/PLATELET
Basophils Absolute: 0.1 10*3/uL (ref 0.0–0.1)
Eosinophils Absolute: 0.2 10*3/uL (ref 0.0–0.5)
HGB: 12.5 g/dL — ABNORMAL LOW (ref 13.0–17.1)
MCV: 92.1 fL (ref 79.3–98.0)
NEUT#: 3.6 10*3/uL (ref 1.5–6.5)
RDW: 13.8 % (ref 11.0–14.6)
lymph#: 0.8 10*3/uL — ABNORMAL LOW (ref 0.9–3.3)

## 2013-06-13 MED ORDER — ONDANSETRON HCL 8 MG PO TABS
8.0000 mg | ORAL_TABLET | Freq: Once | ORAL | Status: AC
  Administered 2013-06-13: 8 mg via ORAL

## 2013-06-13 MED ORDER — BORTEZOMIB CHEMO SQ INJECTION 3.5 MG (2.5MG/ML)
1.3000 mg/m2 | Freq: Once | INTRAMUSCULAR | Status: AC
  Administered 2013-06-13: 3.25 mg via SUBCUTANEOUS
  Filled 2013-06-13: qty 3.25

## 2013-06-13 NOTE — Patient Instructions (Signed)
[facility] Discharge Instructions for Patients Receiving Chemotherapy  Today you received the following chemotherapy agents: Velcade.  To help prevent nausea and vomiting after your treatment, we encourage you to take your nausea medication as prescribed.   If you develop nausea and vomiting that is not controlled by your nausea medication, call the clinic.   BELOW ARE SYMPTOMS THAT SHOULD BE REPORTED IMMEDIATELY:  *FEVER GREATER THAN 100.5 F  *CHILLS WITH OR WITHOUT FEVER  NAUSEA AND VOMITING THAT IS NOT CONTROLLED WITH YOUR NAUSEA MEDICATION  *UNUSUAL SHORTNESS OF BREATH  *UNUSUAL BRUISING OR BLEEDING  TENDERNESS IN MOUTH AND THROAT WITH OR WITHOUT PRESENCE OF ULCERS  *URINARY PROBLEMS  *BOWEL PROBLEMS  UNUSUAL RASH Items with * indicate a potential emergency and should be followed up as soon as possible.  Feel free to call the clinic you have any questions or concerns. The clinic phone number is [phone].    

## 2013-06-20 ENCOUNTER — Ambulatory Visit (HOSPITAL_BASED_OUTPATIENT_CLINIC_OR_DEPARTMENT_OTHER): Payer: No Typology Code available for payment source

## 2013-06-20 ENCOUNTER — Other Ambulatory Visit (HOSPITAL_BASED_OUTPATIENT_CLINIC_OR_DEPARTMENT_OTHER): Payer: No Typology Code available for payment source

## 2013-06-20 ENCOUNTER — Other Ambulatory Visit: Payer: Self-pay | Admitting: Hematology and Oncology

## 2013-06-20 VITALS — BP 146/86 | HR 87 | Temp 98.1°F

## 2013-06-20 DIAGNOSIS — C888 Other malignant immunoproliferative diseases not having achieved remission: Secondary | ICD-10-CM

## 2013-06-20 DIAGNOSIS — C9022 Extramedullary plasmacytoma in relapse: Secondary | ICD-10-CM

## 2013-06-20 DIAGNOSIS — Z5112 Encounter for antineoplastic immunotherapy: Secondary | ICD-10-CM

## 2013-06-20 DIAGNOSIS — D729 Disorder of white blood cells, unspecified: Secondary | ICD-10-CM

## 2013-06-20 LAB — CBC WITH DIFFERENTIAL/PLATELET
Basophils Absolute: 0 10*3/uL (ref 0.0–0.1)
Eosinophils Absolute: 0.2 10*3/uL (ref 0.0–0.5)
HCT: 37.1 % — ABNORMAL LOW (ref 38.4–49.9)
HGB: 13.1 g/dL (ref 13.0–17.1)
LYMPH%: 14.3 % (ref 14.0–49.0)
MCV: 92.3 fL (ref 79.3–98.0)
MONO%: 10.2 % (ref 0.0–14.0)
NEUT#: 2.9 10*3/uL (ref 1.5–6.5)
Platelets: 112 10*3/uL — ABNORMAL LOW (ref 140–400)

## 2013-06-20 MED ORDER — BORTEZOMIB CHEMO SQ INJECTION 3.5 MG (2.5MG/ML)
1.3000 mg/m2 | Freq: Once | INTRAMUSCULAR | Status: AC
  Administered 2013-06-20: 3.25 mg via SUBCUTANEOUS
  Filled 2013-06-20: qty 3.25

## 2013-06-20 MED ORDER — ONDANSETRON HCL 8 MG PO TABS
8.0000 mg | ORAL_TABLET | Freq: Once | ORAL | Status: AC
  Administered 2013-06-20: 8 mg via ORAL

## 2013-06-20 MED ORDER — ONDANSETRON HCL 8 MG PO TABS
ORAL_TABLET | ORAL | Status: AC
  Administered 2013-06-20: 8 mg via ORAL
  Filled 2013-06-20: qty 1

## 2013-06-20 NOTE — Patient Instructions (Addendum)
[facility] Discharge Instructions for Patients Receiving Chemotherapy  Today you received the following chemotherapy agents: Velcade.  To help prevent nausea and vomiting after your treatment, we encourage you to take your nausea medication as prescribed.   If you develop nausea and vomiting that is not controlled by your nausea medication, call the clinic.   BELOW ARE SYMPTOMS THAT SHOULD BE REPORTED IMMEDIATELY:  *FEVER GREATER THAN 100.5 F  *CHILLS WITH OR WITHOUT FEVER  NAUSEA AND VOMITING THAT IS NOT CONTROLLED WITH YOUR NAUSEA MEDICATION  *UNUSUAL SHORTNESS OF BREATH  *UNUSUAL BRUISING OR BLEEDING  TENDERNESS IN MOUTH AND THROAT WITH OR WITHOUT PRESENCE OF ULCERS  *URINARY PROBLEMS  *BOWEL PROBLEMS  UNUSUAL RASH Items with * indicate a potential emergency and should be followed up as soon as possible.  Feel free to call the clinic you have any questions or concerns. The clinic phone number is [phone].    

## 2013-06-26 ENCOUNTER — Other Ambulatory Visit: Payer: Self-pay | Admitting: Hematology and Oncology

## 2013-06-26 DIAGNOSIS — C9022 Extramedullary plasmacytoma in relapse: Secondary | ICD-10-CM

## 2013-06-27 ENCOUNTER — Other Ambulatory Visit: Payer: No Typology Code available for payment source | Admitting: Lab

## 2013-06-27 ENCOUNTER — Other Ambulatory Visit: Payer: Self-pay | Admitting: *Deleted

## 2013-06-27 NOTE — Telephone Encounter (Signed)
THIS REFILL REQUEST FOR REVLIMID WAS GIVEN TO DR.GROSUCH'S NURSE, NATALIE STROUD,RN.

## 2013-06-29 ENCOUNTER — Other Ambulatory Visit: Payer: Self-pay | Admitting: *Deleted

## 2013-06-29 DIAGNOSIS — D729 Disorder of white blood cells, unspecified: Secondary | ICD-10-CM

## 2013-06-29 MED ORDER — LENALIDOMIDE 25 MG PO CAPS: 25.0000 mg | ORAL_CAPSULE | Freq: Every day | ORAL | Status: DC

## 2013-06-30 ENCOUNTER — Telehealth: Payer: Self-pay | Admitting: *Deleted

## 2013-06-30 NOTE — Telephone Encounter (Signed)
Lm informed the pt that his appt for 07/04/13 was rs to 07/05/13 w/ labs @ 2:45pm and ov @ 3:15pm..td

## 2013-07-04 ENCOUNTER — Ambulatory Visit: Payer: No Typology Code available for payment source

## 2013-07-04 ENCOUNTER — Other Ambulatory Visit: Payer: No Typology Code available for payment source | Admitting: Lab

## 2013-07-05 ENCOUNTER — Ambulatory Visit (HOSPITAL_BASED_OUTPATIENT_CLINIC_OR_DEPARTMENT_OTHER): Payer: No Typology Code available for payment source | Admitting: Hematology and Oncology

## 2013-07-05 ENCOUNTER — Telehealth: Payer: Self-pay | Admitting: Hematology and Oncology

## 2013-07-05 ENCOUNTER — Ambulatory Visit (HOSPITAL_BASED_OUTPATIENT_CLINIC_OR_DEPARTMENT_OTHER): Payer: No Typology Code available for payment source

## 2013-07-05 ENCOUNTER — Encounter: Payer: Self-pay | Admitting: Hematology and Oncology

## 2013-07-05 ENCOUNTER — Other Ambulatory Visit (HOSPITAL_BASED_OUTPATIENT_CLINIC_OR_DEPARTMENT_OTHER): Payer: No Typology Code available for payment source | Admitting: Lab

## 2013-07-05 VITALS — BP 135/86 | HR 75 | Temp 98.4°F | Resp 20 | Ht 75.0 in | Wt 272.9 lb

## 2013-07-05 DIAGNOSIS — F419 Anxiety disorder, unspecified: Secondary | ICD-10-CM

## 2013-07-05 DIAGNOSIS — D729 Disorder of white blood cells, unspecified: Secondary | ICD-10-CM

## 2013-07-05 DIAGNOSIS — C9002 Multiple myeloma in relapse: Secondary | ICD-10-CM

## 2013-07-05 DIAGNOSIS — F411 Generalized anxiety disorder: Secondary | ICD-10-CM

## 2013-07-05 DIAGNOSIS — IMO0001 Reserved for inherently not codable concepts without codable children: Secondary | ICD-10-CM

## 2013-07-05 DIAGNOSIS — C9001 Multiple myeloma in remission: Secondary | ICD-10-CM | POA: Insufficient documentation

## 2013-07-05 DIAGNOSIS — C9022 Extramedullary plasmacytoma in relapse: Secondary | ICD-10-CM

## 2013-07-05 DIAGNOSIS — Z5112 Encounter for antineoplastic immunotherapy: Secondary | ICD-10-CM

## 2013-07-05 DIAGNOSIS — Z23 Encounter for immunization: Secondary | ICD-10-CM

## 2013-07-05 DIAGNOSIS — G8929 Other chronic pain: Secondary | ICD-10-CM

## 2013-07-05 HISTORY — DX: Anxiety disorder, unspecified: F41.9

## 2013-07-05 HISTORY — DX: Multiple myeloma in relapse: C90.02

## 2013-07-05 LAB — COMPREHENSIVE METABOLIC PANEL (CC13)
ALT: 26 U/L (ref 0–55)
AST: 16 U/L (ref 5–34)
Alkaline Phosphatase: 73 U/L (ref 40–150)
BUN: 10.1 mg/dL (ref 7.0–26.0)
Calcium: 9 mg/dL (ref 8.4–10.4)
Chloride: 106 mEq/L (ref 98–109)
Creatinine: 0.8 mg/dL (ref 0.7–1.3)
Glucose: 82 mg/dl (ref 70–140)
Total Protein: 6.9 g/dL (ref 6.4–8.3)

## 2013-07-05 LAB — CBC WITH DIFFERENTIAL/PLATELET
BASO%: 2.5 % — ABNORMAL HIGH (ref 0.0–2.0)
Basophils Absolute: 0.1 10*3/uL (ref 0.0–0.1)
EOS%: 1.5 % (ref 0.0–7.0)
Eosinophils Absolute: 0.1 10*3/uL (ref 0.0–0.5)
HGB: 13.2 g/dL (ref 13.0–17.1)
MCH: 32.2 pg (ref 27.2–33.4)
MCHC: 35 g/dL (ref 32.0–36.0)
MCV: 92 fL (ref 79.3–98.0)
MONO#: 0.4 10*3/uL (ref 0.1–0.9)
MONO%: 9.4 % (ref 0.0–14.0)
NEUT%: 73.8 % (ref 39.0–75.0)
Platelets: 177 10*3/uL (ref 140–400)
RDW: 13.8 % (ref 11.0–14.6)
WBC: 4.6 10*3/uL (ref 4.0–10.3)
lymph#: 0.6 10*3/uL — ABNORMAL LOW (ref 0.9–3.3)

## 2013-07-05 MED ORDER — LORAZEPAM 1 MG PO TABS: 1.0000 mg | ORAL_TABLET | Freq: Three times a day (TID) | ORAL | Status: DC | PRN

## 2013-07-05 MED ORDER — ONDANSETRON HCL 8 MG PO TABS
8.0000 mg | ORAL_TABLET | Freq: Once | ORAL | Status: AC
Start: 2013-07-05 — End: 2013-07-05
  Administered 2013-07-05: 8 mg via ORAL

## 2013-07-05 MED ORDER — ONDANSETRON HCL 8 MG PO TABS
ORAL_TABLET | ORAL | Status: AC
  Filled 2013-07-05: qty 1

## 2013-07-05 MED ORDER — HYDROCODONE-ACETAMINOPHEN 7.5-325 MG PO TABS: 1.0000 | ORAL_TABLET | Freq: Four times a day (QID) | ORAL | Status: DC | PRN

## 2013-07-05 MED ORDER — VITAMIN D 1000 UNITS PO TABS: 1000.0000 [IU] | ORAL_TABLET | Freq: Every day | ORAL | Status: DC

## 2013-07-05 MED ORDER — INFLUENZA VAC SPLIT QUAD 0.5 ML IM SUSP
0.5000 mL | Freq: Once | INTRAMUSCULAR | Status: AC
  Administered 2013-07-05: 0.5 mL via INTRAMUSCULAR
  Filled 2013-07-05: qty 0.5

## 2013-07-05 MED ORDER — BORTEZOMIB CHEMO SQ INJECTION 3.5 MG (2.5MG/ML)
1.3000 mg/m2 | Freq: Once | INTRAMUSCULAR | Status: AC
  Administered 2013-07-05: 3.25 mg via SUBCUTANEOUS
  Filled 2013-07-05: qty 3.25

## 2013-07-05 NOTE — Patient Instructions (Signed)
[facility] Discharge Instructions for Patients Receiving Chemotherapy  Today you received the following chemotherapy agents Velcade To help prevent nausea and vomiting after your treatment, we encourage you to take your nausea medication as per Dr. Bertis Ruddy. (Zofran and Compazine) If you develop nausea and vomiting that is not controlled by your nausea medication, call the clinic.   BELOW ARE SYMPTOMS THAT SHOULD BE REPORTED IMMEDIATELY:  *FEVER GREATER THAN 100.5 F  *CHILLS WITH OR WITHOUT FEVER  NAUSEA AND VOMITING THAT IS NOT CONTROLLED WITH YOUR NAUSEA MEDICATION  *UNUSUAL SHORTNESS OF BREATH  *UNUSUAL BRUISING OR BLEEDING  TENDERNESS IN MOUTH AND THROAT WITH OR WITHOUT PRESENCE OF ULCERS  *URINARY PROBLEMS  *BOWEL PROBLEMS  UNUSUAL RASH Items with * indicate a potential emergency and should be followed up as soon as possible.  Feel free to call the clinic you have any questions or concerns. The clinic phone number is [phone].  Influenza Vaccine (Flu Vaccine, Inactivated) 2013 2014 What You Need to Know WHY GET VACCINATED?  Influenza ("flu") is a contagious disease that spreads around the [place] every winter, usually between October and May.  Flu is caused by the influenza virus, and can be spread by coughing, sneezing, and close contact.  Anyone can get flu, but the risk of getting flu is highest among children. Symptoms come on suddenly and may last several days. They can include:  Fever or chills.  Sore throat.  Muscle aches.  Fatigue.  Cough.  Headache.  Runny or stuffy nose. Flu can make some people much sicker than others. These people include young children, people 27 and older, pregnant women, and people with certain health conditions such as heart, lung or kidney disease, or a weakened immune system. Flu vaccine is especially important for these people, and anyone in close contact with them. Flu can also lead to pneumonia,  and make existing medical conditions worse. It can cause diarrhea and seizures in children. Each year thousands of people in the [place] die from flu, and many more are hospitalized. Flu vaccine is the best protection we have from flu and its complications. Flu vaccine also helps prevent spreading flu from person to person. INACTIVATED FLU VACCINE There are 2 types of influenza vaccine:  You are getting an inactivated flu vaccine, which does not contain any live influenza virus. It is given by injection with a needle, and often called the "flu shot."  A different live, attenuated (weakened) influenza vaccine is sprayed into the nostrils. This vaccine is described in a separate Vaccine Information Statement. Flu vaccine is recommended every year. Children 6 months through 52 years of age should get 2 doses the first year they get vaccinated. Flu viruses are always changing. Each year's flu vaccine is made to protect from viruses that are most likely to cause disease that year. While flu vaccine cannot prevent all cases of flu, it is our best defense against the disease. Inactivated flu vaccine protects against 3 or 4 different influenza viruses. It takes about 2 weeks for protection to develop after the vaccination, and protection lasts several months to a year. Some illnesses that are not caused by influenza virus are often mistaken for flu. Flu vaccine will not prevent these illnesses. It can only prevent influenza. A "high-dose" flu vaccine is available for people 44 years of age and older. The person giving you the vaccine can tell you more about it. Some inactivated flu vaccine contains a very small amount of a  mercury-based preservative called thimerosal. Studies have shown that thimerosal in vaccines is not harmful, but flu vaccines that do not contain a preservative are available. SOME PEOPLE SHOULD NOT GET THIS VACCINE Tell the person who gives you the vaccine:  If you have any severe  (life-threatening) allergies. If you ever had a life-threatening allergic reaction after a dose of flu vaccine, or have a severe allergy to any part of this vaccine, you may be advised not to get a dose. Most, but not all, types of flu vaccine contain a small amount of egg.  If you ever had Guillain Barr Syndrome (a severe paralyzing illness, also called GBS). Some people with a history of GBS should not get this vaccine. This should be discussed with your doctor.  If you are not feeling well. They might suggest waiting until you feel better. But you should come back. RISKS OF A VACCINE REACTION With a vaccine, like any medicine, there is a chance of side effects. These are usually mild and go away on their own. Serious side effects are also possible, but are very rare. Inactivated flu vaccine does not contain live flu virus, sogetting flu from this vaccine is not possible. Brief fainting spells and related symptoms (such as jerking movements) can happen after any medical procedure, including vaccination. Sitting or lying down for about 15 minutes after a vaccination can help prevent fainting and injuries caused by falls. Tell your doctor if you feel dizzy or lightheaded, or have vision changes or ringing in the ears. Mild problems following inactivated flu vaccine:  Soreness, redness, or swelling where the shot was given.  Hoarseness; sore, red or itchy eyes; or cough.  Fever.  Aches.  Headache.  Itching.  Fatigue. If these problems occur, they usually begin soon after the shot and last 1 or 2 days. Moderate problems following inactivated flu vaccine:  Young children who get inactivated flu vaccine and pneumococcal vaccine (PCV13) at the same time may be at increased risk for seizures caused by fever. Ask your doctor for more information. Tell your doctor if a child who is getting flu vaccine has ever had a seizure. Severe problems following inactivated flu vaccine:  A severe allergic  reaction could occur after any vaccine (estimated less than 1 in a million doses).  There is a small possibility that inactivated flu vaccine could be associated with Guillan Barr Syndrome (GBS), no more than 1 or 2 cases per million people vaccinated. This is much lower than the risk of severe complications from flu, which can be prevented by flu vaccine. The safety of vaccines is always being monitored. For more information, visit: http://floyd.org/ WHAT IF THERE IS A SERIOUS REACTION? What should I look for?  Look for anything that concerns you, such as signs of a severe allergic reaction, very high fever, or behavior changes. Signs of a severe allergic reaction can include hives, swelling of the face and throat, difficulty breathing, a fast heartbeat, dizziness, and weakness. These would start a few minutes to a few hours after the vaccination. What should I do?  If you think it is a severe allergic reaction or other emergency that cannot wait, call 9 1 1  or get the person to the nearest hospital. Otherwise, call your doctor.  Afterward, the reaction should be reported to the Vaccine Adverse Event Reporting System (VAERS). Your doctor might file this report, or you can do it yourself through the VAERS website at www.vaers.LAgents.no, or by calling 1-563-492-0248. VAERS is only for  reporting reactions. They do not give medical advice. THE NATIONAL VACCINE INJURY COMPENSATION PROGRAM The National Vaccine Injury Compensation Program (VICP) is a federal program that was created to compensate people who may have been injured by certain vaccines. Persons who believe they may have been injured by a vaccine can learn about the program and about filing a claim by calling 1-250-863-8926 or visiting the VICP website at SpiritualWord.at HOW CAN I LEARN MORE?  Ask your doctor.  Call your local or state health department.  Contact the Centers for Disease Control and Prevention  (CDC):  Call (408)098-9134 (1-800-CDC-INFO) or  Visit CDC's website at BiotechRoom.com.cy CDC Inactivated Influenza Vaccine Interim VIS (05/06/12) Document Released: 07/23/2006 Document Revised: 06/22/2012 Document Reviewed: 05/06/2012 [facility] Patient Information 2014 [place], [place].

## 2013-07-05 NOTE — Telephone Encounter (Signed)
Gave pt apt for lab and MD , emailed Mcihelle for chemo for October 2014

## 2013-07-05 NOTE — Progress Notes (Signed)
[facility] OFFICE PROGRESS NOTE  Colette Ribas, MD [address] [place] [address]  DIAGNOSIS: Kappa light chain multiple myeloma, on combination treatment with Revlimid, Velcade, and dexamethasone.  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   PAST THERAPY: He completed 50.4 gray in 28 fractions on 04/08/2012  CURRENT THERAPY: started chemo Rev/Vel/Dex on 04/10/13.   INTERVAL HISTORY: Austin Griffith 52 y.o. male returns for followup. He went to [facility] recently and corresponds evaluation is in progress. He denies any side effects from treatment. He have significant chronic musculoskeletal pain of which he takes alcohol twice a day to control his pain. He denies any constipation or nausea. He is currently taking Lovenox injection for DVT prophylaxis. He denies any bleeding such as epistaxis hematuria or hematochezia. He denies any dental problems. He has not been prescribed IV bisphosphonate. He also has significant anxiety well controlled with lorazepam.  I have reviewed the past medical history, past surgical history, social history and family history with the patient and they are unchanged from previous note.  ALLERGIES:  has No Known Allergies.  MEDICATIONS: has a current medication list which includes the following prescription(s): acyclovir, cyclosporine, dexamethasone, enoxaparin, escitalopram, hydrocodone-acetaminophen, ibuprofen, lansoprazole, lenalidomide, lorazepam, naproxen sodium, olmesartan, ondansetron, prochlorperazine, triamcinolone, cholecalciferol, and lactulose, and the following Facility-Administered Medications: influenza vac split quadrivalent pf.  REVIEW OF SYSTEMS:   Constitutional: Denies fevers, chills or abnormal weight loss Eyes: Denies blurriness of vision Ears, nose, mouth, throat, and face: Denies  mucositis or sore throat Respiratory: Denies cough, dyspnea or wheezes Cardiovascular: Denies palpitation, chest discomfort or lower extremity swelling Gastrointestinal:  Denies nausea, heartburn or change in bowel habits Skin: Denies abnormal skin rashes Lymphatics: Denies new lymphadenopathy or easy bruising Neurological:Denies numbness, tingling or new weaknesses Behavioral/Psych: Mood is stable, no new changes  All other systems were reviewed with the patient and are negative.  PHYSICAL EXAMINATION: ECOG PERFORMANCE STATUS: 1 - Symptomatic but completely ambulatory  Filed Vitals:   07/05/13 1527  BP: 135/86  Pulse: 75  Temp: 98.4 F (36.9 C)  Resp: 20    GENERAL:alert, no distress and comfortable SKIN: skin color, texture, turgor are normal, no rashes or significant lesions EYES: normal, Conjunctiva are pink and non-injected, sclera clear OROPHARYNX:no exudate, no erythema and lips, buccal mucosa, and tongue normal  NECK: supple, thyroid normal size, non-tender, without nodularity LYMPH:  no palpable lymphadenopathy in the cervical, axillary or inguinal LUNGS: clear to auscultation and percussion with normal breathing effort HEART: regular rate & rhythm and no murmurs and no lower extremity edema ABDOMEN:abdomen soft, non-tender and normal bowel sounds Musculoskeletal:no cyanosis of digits and no clubbing  NEURO: alert & oriented x 3 with fluent speech, no focal motor/sensory deficits  LABORATORY DATA:  I have reviewed the data as listed    Component Value Date/Time   NA 137 07/05/2013 1457   NA 138 03/03/2013 1434   NA 138 05/20/2012 1105   K 4.2 07/05/2013 1457   K 4.5 03/03/2013 1434   K 4.5 05/20/2012 1105   CL 105 03/30/2013 1340   CL 101 03/03/2013 1434   CL 100 05/20/2012 1105   CO2 24 07/05/2013 1457   CO2 27 03/03/2013 1434   CO2 27 05/20/2012 1105   GLUCOSE 82 07/05/2013 1457   GLUCOSE 120* 03/30/2013 1340   GLUCOSE 88 03/03/2013 1434   GLUCOSE 110 05/20/2012 1105  BUN  10.1 07/05/2013 1457   BUN 16 03/03/2013 1434   BUN 15 05/20/2012 1105   CREATININE 0.8 07/05/2013 1457   CREATININE 1.05 03/03/2013 1434   CREATININE 1.0 05/20/2012 1105   CALCIUM 9.0 07/05/2013 1457   CALCIUM 9.4 03/03/2013 1434   CALCIUM 9.0 05/20/2012 1105   PROT 6.9 07/05/2013 1457   PROT 7.1 03/03/2013 1434   PROT 6.8 05/20/2012 1105   ALBUMIN 3.3* 07/05/2013 1457   ALBUMIN 4.1 03/03/2013 1434   AST 16 07/05/2013 1457   AST 37 03/03/2013 1434   AST 30 05/20/2012 1105   ALT 26 07/05/2013 1457   ALT 51 03/03/2013 1434   ALT 52* 05/20/2012 1105   ALKPHOS 73 07/05/2013 1457   ALKPHOS 91 03/03/2013 1434   ALKPHOS 58 05/20/2012 1105   BILITOT 0.86 07/05/2013 1457   BILITOT 0.8 03/03/2013 1434   BILITOT 0.90 05/20/2012 1105   GFRNONAA 82* 03/03/2013 1434   GFRAA >90 03/03/2013 1434    I No results found for this basename: SPEP, UPEP,  kappa and lambda light chains    Lab Results  Component Value Date   WBC 4.6 07/05/2013   NEUTROABS 3.4 07/05/2013   HGB 13.2 07/05/2013   HCT 37.8* 07/05/2013   MCV 92.0 07/05/2013   PLT 177 07/05/2013      Chemistry      Component Value Date/Time   NA 137 07/05/2013 1457   NA 138 03/03/2013 1434   NA 138 05/20/2012 1105   K 4.2 07/05/2013 1457   K 4.5 03/03/2013 1434   K 4.5 05/20/2012 1105   CL 105 03/30/2013 1340   CL 101 03/03/2013 1434   CL 100 05/20/2012 1105   CO2 24 07/05/2013 1457   CO2 27 03/03/2013 1434   CO2 27 05/20/2012 1105   BUN 10.1 07/05/2013 1457   BUN 16 03/03/2013 1434   BUN 15 05/20/2012 1105   CREATININE 0.8 07/05/2013 1457   CREATININE 1.05 03/03/2013 1434   CREATININE 1.0 05/20/2012 1105      Component Value Date/Time   CALCIUM 9.0 07/05/2013 1457   CALCIUM 9.4 03/03/2013 1434   CALCIUM 9.0 05/20/2012 1105   ALKPHOS 73 07/05/2013 1457   ALKPHOS 91 03/03/2013 1434   ALKPHOS 58 05/20/2012 1105   AST 16 07/05/2013 1457   AST 37 03/03/2013 1434   AST 30 05/20/2012 1105   ALT 26 07/05/2013 1457   ALT 51 03/03/2013 1434   ALT 52* 05/20/2012 1105   BILITOT 0.86 07/05/2013  1457   BILITOT 0.8 03/03/2013 1434   BILITOT 0.90 05/20/2012 1105     ASSESSMENT: Kappa light chain multiple myeloma, ongoing treatment   PLAN:  #1 kappa light chain multiple myeloma The patient is currently undergoing transplant evaluation. My plan would be to give him 4 cycles of chemotherapy followed by autologous stem cell transplant. He has received 9 injections of Velcade weekly. His house on 40 mg of dexamethasone weekly. He is tolerating Revlimid well. He is on a 21 days treatment, off 7 days with each cycle of Revlimid. I recommended a prescription of IV bisphosphonate. The patient will see his dentist within the next few weeks. When he returns to see me in the future, we will prescribe IV Zometa. We discussed briefly some of the risks benefits side effects of IV bisphosphonate and he is in agreement to proceed. #2 chronic musculoskeletal pain Am wondering whether this could be due to his disease or chronic colitis. I recommend calcium and  vitamin D supplement. I refilled his prescription pain medicine today. #3 anxiety I refilled his prescription for lorazepam today #4 preventive care We will give him influenza vaccination today. #5 DVT prophylaxis He'll continue Lovenox subcutaneous injection as long as you remain on Revlimid #6 antimicrobial prophylaxis Will continue acyclovir.  All questions were answered. The patient knows to call the clinic with any problems, questions or concerns. We can certainly see the patient much sooner if necessary. No barriers to learning was detected. I spent 40 minutes counseling the patient face to face. The total time spent in the appointment was 60 minutes and more than 50% was on counseling.     [facility], Necole Minassian, MD 07/05/2013 4:01 PM

## 2013-07-06 ENCOUNTER — Telehealth: Payer: Self-pay | Admitting: *Deleted

## 2013-07-06 NOTE — Telephone Encounter (Signed)
Per staff message and POF I have scheduled appts.  JMW  

## 2013-07-07 LAB — IMMUNOFIXATION ELECTROPHORESIS
IgG (Immunoglobin G), Serum: 643 mg/dL — ABNORMAL LOW (ref 650–1600)
IgM, Serum: 39 mg/dL — ABNORMAL LOW (ref 41–251)
Total Protein, Serum Electrophoresis: 6.3 g/dL (ref 6.0–8.3)

## 2013-07-07 LAB — KAPPA/LAMBDA LIGHT CHAINS: Kappa:Lambda Ratio: 0.61 (ref 0.26–1.65)

## 2013-07-11 ENCOUNTER — Other Ambulatory Visit: Payer: Self-pay | Admitting: Hematology and Oncology

## 2013-07-11 DIAGNOSIS — C9002 Multiple myeloma in relapse: Secondary | ICD-10-CM

## 2013-07-12 ENCOUNTER — Other Ambulatory Visit (HOSPITAL_BASED_OUTPATIENT_CLINIC_OR_DEPARTMENT_OTHER): Payer: No Typology Code available for payment source | Admitting: Lab

## 2013-07-12 ENCOUNTER — Ambulatory Visit (HOSPITAL_BASED_OUTPATIENT_CLINIC_OR_DEPARTMENT_OTHER): Payer: No Typology Code available for payment source

## 2013-07-12 VITALS — BP 142/85 | HR 83 | Temp 98.2°F

## 2013-07-12 DIAGNOSIS — C888 Other malignant immunoproliferative diseases: Secondary | ICD-10-CM

## 2013-07-12 DIAGNOSIS — D729 Disorder of white blood cells, unspecified: Secondary | ICD-10-CM

## 2013-07-12 DIAGNOSIS — C9002 Multiple myeloma in relapse: Secondary | ICD-10-CM

## 2013-07-12 DIAGNOSIS — Z5112 Encounter for antineoplastic immunotherapy: Secondary | ICD-10-CM

## 2013-07-12 LAB — BASIC METABOLIC PANEL (CC13)
BUN: 9.5 mg/dL (ref 7.0–26.0)
CO2: 24 mEq/L (ref 22–29)
Calcium: 9.3 mg/dL (ref 8.4–10.4)
Creatinine: 0.9 mg/dL (ref 0.7–1.3)
Sodium: 139 mEq/L (ref 136–145)

## 2013-07-12 LAB — CBC WITH DIFFERENTIAL/PLATELET
BASO%: 0.4 % (ref 0.0–2.0)
EOS%: 1.5 % (ref 0.0–7.0)
HCT: 39.4 % (ref 38.4–49.9)
LYMPH%: 18.2 % (ref 14.0–49.0)
MCH: 32.5 pg (ref 27.2–33.4)
MCHC: 35.2 g/dL (ref 32.0–36.0)
MCV: 92.1 fL (ref 79.3–98.0)
MONO%: 5.8 % (ref 0.0–14.0)
NEUT%: 74.1 % (ref 39.0–75.0)
Platelets: 118 10*3/uL — ABNORMAL LOW (ref 140–400)

## 2013-07-12 MED ORDER — ONDANSETRON HCL 8 MG PO TABS
8.0000 mg | ORAL_TABLET | Freq: Once | ORAL | Status: AC
  Administered 2013-07-12: 8 mg via ORAL

## 2013-07-12 MED ORDER — ONDANSETRON HCL 8 MG PO TABS
ORAL_TABLET | ORAL | Status: AC
  Filled 2013-07-12: qty 1

## 2013-07-12 MED ORDER — BORTEZOMIB CHEMO SQ INJECTION 3.5 MG (2.5MG/ML)
1.3000 mg/m2 | Freq: Once | INTRAMUSCULAR | Status: AC
  Administered 2013-07-12: 3.25 mg via SUBCUTANEOUS
  Filled 2013-07-12: qty 3.25

## 2013-07-12 NOTE — Progress Notes (Signed)
Pt's b/p elevated at the beginning of the appt.  RN rechecked it at end. B/P down significantly.  PT instructed to to keep check of b/p and take medication as needed.  Pt and wife verbalized understanding.

## 2013-07-12 NOTE — Patient Instructions (Addendum)
[facility] Discharge Instructions for Patients Receiving Chemotherapy  Today you received the following chemotherapy agents velcade.  To help prevent nausea and vomiting after your treatment, we encourage you to take your nausea medication as prescribed.   If you develop nausea and vomiting that is not controlled by your nausea medication, call the clinic.   BELOW ARE SYMPTOMS THAT SHOULD BE REPORTED IMMEDIATELY:  *FEVER GREATER THAN 100.5 F  *CHILLS WITH OR WITHOUT FEVER  NAUSEA AND VOMITING THAT IS NOT CONTROLLED WITH YOUR NAUSEA MEDICATION  *UNUSUAL SHORTNESS OF BREATH  *UNUSUAL BRUISING OR BLEEDING  TENDERNESS IN MOUTH AND THROAT WITH OR WITHOUT PRESENCE OF ULCERS  *URINARY PROBLEMS  *BOWEL PROBLEMS  UNUSUAL RASH Items with * indicate a potential emergency and should be followed up as soon as possible.  Feel free to call the clinic you have any questions or concerns. The clinic phone number is [phone].    

## 2013-07-17 ENCOUNTER — Other Ambulatory Visit: Payer: Self-pay | Admitting: Certified Registered Nurse Anesthetist

## 2013-07-19 ENCOUNTER — Other Ambulatory Visit (HOSPITAL_BASED_OUTPATIENT_CLINIC_OR_DEPARTMENT_OTHER): Payer: No Typology Code available for payment source | Admitting: Lab

## 2013-07-19 ENCOUNTER — Ambulatory Visit (HOSPITAL_BASED_OUTPATIENT_CLINIC_OR_DEPARTMENT_OTHER): Payer: No Typology Code available for payment source

## 2013-07-19 VITALS — BP 101/67 | HR 76 | Temp 97.0°F

## 2013-07-19 DIAGNOSIS — C9002 Multiple myeloma in relapse: Secondary | ICD-10-CM

## 2013-07-19 DIAGNOSIS — C888 Other malignant immunoproliferative diseases: Secondary | ICD-10-CM

## 2013-07-19 DIAGNOSIS — Z5112 Encounter for antineoplastic immunotherapy: Secondary | ICD-10-CM

## 2013-07-19 DIAGNOSIS — D729 Disorder of white blood cells, unspecified: Secondary | ICD-10-CM

## 2013-07-19 LAB — CBC WITH DIFFERENTIAL/PLATELET
Basophils Absolute: 0 10*3/uL (ref 0.0–0.1)
EOS%: 5 % (ref 0.0–7.0)
HCT: 37.2 % — ABNORMAL LOW (ref 38.4–49.9)
HGB: 13.3 g/dL (ref 13.0–17.1)
MCH: 32.4 pg (ref 27.2–33.4)
MCV: 90.5 fL (ref 79.3–98.0)
MONO#: 0.5 10*3/uL (ref 0.1–0.9)
MONO%: 11.2 % (ref 0.0–14.0)
NEUT#: 2.7 10*3/uL (ref 1.5–6.5)
RBC: 4.11 10*6/uL — ABNORMAL LOW (ref 4.20–5.82)
RDW: 13.5 % (ref 11.0–14.6)
lymph#: 0.8 10*3/uL — ABNORMAL LOW (ref 0.9–3.3)
nRBC: 0 % (ref 0–0)

## 2013-07-19 LAB — BASIC METABOLIC PANEL (CC13)
Anion Gap: 9 mEq/L (ref 3–11)
Calcium: 8.9 mg/dL (ref 8.4–10.4)
Glucose: 92 mg/dl (ref 70–140)
Potassium: 3.3 mEq/L — ABNORMAL LOW (ref 3.5–5.1)
Sodium: 141 mEq/L (ref 136–145)

## 2013-07-19 MED ORDER — ONDANSETRON HCL 8 MG PO TABS
ORAL_TABLET | ORAL | Status: AC
  Filled 2013-07-19: qty 1

## 2013-07-19 MED ORDER — ONDANSETRON HCL 8 MG PO TABS
8.0000 mg | ORAL_TABLET | Freq: Once | ORAL | Status: AC
  Administered 2013-07-19: 8 mg via ORAL

## 2013-07-19 MED ORDER — BORTEZOMIB CHEMO SQ INJECTION 3.5 MG (2.5MG/ML)
1.3000 mg/m2 | Freq: Once | INTRAMUSCULAR | Status: AC
  Administered 2013-07-19: 3.25 mg via SUBCUTANEOUS
  Filled 2013-07-19: qty 3.25

## 2013-07-19 NOTE — Patient Instructions (Signed)
[facility] Discharge Instructions for Patients Receiving Chemotherapy  Today you received the following chemotherapy agents:  Velcade  To help prevent nausea and vomiting after your treatment, we encourage you to take your nausea medication as ordered per MD.   If you develop nausea and vomiting that is not controlled by your nausea medication, call the clinic.   BELOW ARE SYMPTOMS THAT SHOULD BE REPORTED IMMEDIATELY:  *FEVER GREATER THAN 100.5 F  *CHILLS WITH OR WITHOUT FEVER  NAUSEA AND VOMITING THAT IS NOT CONTROLLED WITH YOUR NAUSEA MEDICATION  *UNUSUAL SHORTNESS OF BREATH  *UNUSUAL BRUISING OR BLEEDING  TENDERNESS IN MOUTH AND THROAT WITH OR WITHOUT PRESENCE OF ULCERS  *URINARY PROBLEMS  *BOWEL PROBLEMS  UNUSUAL RASH Items with * indicate a potential emergency and should be followed up as soon as possible.  Feel free to call the clinic you have any questions or concerns. The clinic phone number is [phone].    

## 2013-07-25 ENCOUNTER — Telehealth: Payer: Self-pay | Admitting: *Deleted

## 2013-07-25 ENCOUNTER — Telehealth: Payer: Self-pay | Admitting: Radiation Oncology

## 2013-07-25 ENCOUNTER — Other Ambulatory Visit: Payer: Self-pay | Admitting: Hematology and Oncology

## 2013-07-25 NOTE — Telephone Encounter (Signed)
Notified wife that patient will not have any appointments at [facility] on 10/22, will reschedule for 10/29.

## 2013-07-25 NOTE — Telephone Encounter (Signed)
Faxed Recon 03/14/13, EOT 03/28/13 to [facility].  Originally faxed 05/02/13; they cannot locate.  Received confirmation.

## 2013-07-25 NOTE — Telephone Encounter (Signed)
THIS REFILL REQUEST FOR REVLIMID WAS GIVEN TO DR.SHERRILL'S DESK.

## 2013-07-25 NOTE — Telephone Encounter (Signed)
Left message to call us back

## 2013-07-26 ENCOUNTER — Other Ambulatory Visit: Payer: No Typology Code available for payment source | Admitting: Lab

## 2013-07-26 ENCOUNTER — Ambulatory Visit: Payer: No Typology Code available for payment source

## 2013-07-26 ENCOUNTER — Telehealth: Payer: Self-pay | Admitting: Hematology and Oncology

## 2013-07-26 NOTE — Telephone Encounter (Signed)
lmonvm for pt re appt for 10/29 @ 11am lb/fu/tx. Also cancelled 10/21 per staff message from [facility]. Pt also informed of 10/21 cancellation and can get new scheduled when he comes in today.

## 2013-07-27 ENCOUNTER — Other Ambulatory Visit: Payer: Self-pay | Admitting: Hematology and Oncology

## 2013-07-27 ENCOUNTER — Other Ambulatory Visit: Payer: Self-pay | Admitting: *Deleted

## 2013-07-27 DIAGNOSIS — D729 Disorder of white blood cells, unspecified: Secondary | ICD-10-CM

## 2013-07-27 MED ORDER — LENALIDOMIDE 25 MG PO CAPS: 25.0000 mg | ORAL_CAPSULE | Freq: Every day | ORAL | Status: DC

## 2013-07-27 NOTE — Telephone Encounter (Signed)
S/w pt's wife to ask if pt needs Revlimid refill. He saw Dr. Barbaraann Boys at [facility] yesterday and unsure if we are to continue w/ another cycle of VDR prior to stem cell transplant?  Wife is unsure.   Called [facility] and left message w/ nurse.  She will check w/ Dr. Barbaraann Boys and call us back.

## 2013-07-27 NOTE — Telephone Encounter (Signed)
Call back from NP at [facility],  She spoke w/ Dr. Bertis Ruddy and requests pt continue on Velcade, Revlimid, Dex and get BMBX next week.  Revlimid Rx sent to [facility] for refill.

## 2013-07-28 ENCOUNTER — Telehealth: Payer: Self-pay | Admitting: Hematology and Oncology

## 2013-07-28 ENCOUNTER — Telehealth: Payer: Self-pay | Admitting: *Deleted

## 2013-07-28 NOTE — Telephone Encounter (Signed)
Sent michelle a staff message regarding the pt needing a velcade tx on 08/04/2013.

## 2013-07-28 NOTE — Telephone Encounter (Signed)
Per staff message and POF I have scheduled appts.  JMW  

## 2013-07-31 ENCOUNTER — Telehealth: Payer: Self-pay | Admitting: Hematology and Oncology

## 2013-07-31 NOTE — Telephone Encounter (Signed)
lmonvm adviisng the pt of his velcade tx this Friday at 8:45am

## 2013-07-31 NOTE — Telephone Encounter (Signed)
RECEIVED A FAX FROM [facility] CONCERNING A CONFIRMATION OF PRESCRIPTION SHIPMENT FOR REVLIMID ON 07/28/13.

## 2013-08-01 ENCOUNTER — Other Ambulatory Visit: Payer: Self-pay | Admitting: Hematology and Oncology

## 2013-08-01 ENCOUNTER — Telehealth: Payer: Self-pay | Admitting: *Deleted

## 2013-08-01 DIAGNOSIS — C9001 Multiple myeloma in remission: Secondary | ICD-10-CM

## 2013-08-01 DIAGNOSIS — C9002 Multiple myeloma in relapse: Secondary | ICD-10-CM

## 2013-08-01 NOTE — Telephone Encounter (Signed)
BMBx scheduled for tomorrow at 8 am w/ dr. Bertis Ruddy at [facility]  Informed wife of BMBx scheduled for tomorrow morning.  Arrive at [facility] at 7am, NPO after midninght and have driver home.  She verbalized understanding.  She requests Velcade appt for Friday be changed to Thursday,  If not can time be changed from morning to lunch time on Friday?  No room in infusion on Thursday, but time changed from morning to noon on Friday.

## 2013-08-02 ENCOUNTER — Ambulatory Visit: Payer: No Typology Code available for payment source | Admitting: Hematology and Oncology

## 2013-08-02 ENCOUNTER — Ambulatory Visit: Payer: No Typology Code available for payment source

## 2013-08-02 ENCOUNTER — Other Ambulatory Visit: Payer: No Typology Code available for payment source | Admitting: Lab

## 2013-08-02 ENCOUNTER — Ambulatory Visit (HOSPITAL_COMMUNITY): Admission: RE | Admit: 2013-08-02 | Payer: No Typology Code available for payment source | Source: Ambulatory Visit

## 2013-08-03 ENCOUNTER — Other Ambulatory Visit: Payer: No Typology Code available for payment source | Admitting: Lab

## 2013-08-03 ENCOUNTER — Ambulatory Visit: Payer: No Typology Code available for payment source | Admitting: Hematology and Oncology

## 2013-08-04 ENCOUNTER — Ambulatory Visit (HOSPITAL_BASED_OUTPATIENT_CLINIC_OR_DEPARTMENT_OTHER): Payer: No Typology Code available for payment source

## 2013-08-04 ENCOUNTER — Ambulatory Visit (HOSPITAL_BASED_OUTPATIENT_CLINIC_OR_DEPARTMENT_OTHER): Payer: No Typology Code available for payment source | Admitting: Lab

## 2013-08-04 VITALS — BP 112/75 | HR 71 | Temp 97.5°F

## 2013-08-04 DIAGNOSIS — Z5112 Encounter for antineoplastic immunotherapy: Secondary | ICD-10-CM

## 2013-08-04 DIAGNOSIS — C9002 Multiple myeloma in relapse: Secondary | ICD-10-CM

## 2013-08-04 DIAGNOSIS — C888 Other malignant immunoproliferative diseases: Secondary | ICD-10-CM

## 2013-08-04 DIAGNOSIS — D729 Disorder of white blood cells, unspecified: Secondary | ICD-10-CM

## 2013-08-04 LAB — CBC WITH DIFFERENTIAL/PLATELET
BASO%: 1.7 % (ref 0.0–2.0)
EOS%: 0.9 % (ref 0.0–7.0)
Eosinophils Absolute: 0 10*3/uL (ref 0.0–0.5)
HCT: 37 % — ABNORMAL LOW (ref 38.4–49.9)
HGB: 12.6 g/dL — ABNORMAL LOW (ref 13.0–17.1)
LYMPH%: 21.4 % (ref 14.0–49.0)
MCH: 31.9 pg (ref 27.2–33.4)
MCHC: 34.2 g/dL (ref 32.0–36.0)
MONO%: 16.2 % — ABNORMAL HIGH (ref 0.0–14.0)
NEUT%: 59.8 % (ref 39.0–75.0)
Platelets: 144 10*3/uL (ref 140–400)
RBC: 3.96 10*6/uL — ABNORMAL LOW (ref 4.20–5.82)
WBC: 4.4 10*3/uL (ref 4.0–10.3)

## 2013-08-04 LAB — COMPREHENSIVE METABOLIC PANEL (CC13)
ALT: 27 U/L (ref 0–55)
AST: 17 U/L (ref 5–34)
Alkaline Phosphatase: 60 U/L (ref 40–150)
Anion Gap: 10 mEq/L (ref 3–11)
BUN: 10.4 mg/dL (ref 7.0–26.0)
Creatinine: 0.8 mg/dL (ref 0.7–1.3)
Sodium: 140 mEq/L (ref 136–145)
Total Bilirubin: 1.11 mg/dL (ref 0.20–1.20)
Total Protein: 6.6 g/dL (ref 6.4–8.3)

## 2013-08-04 MED ORDER — ONDANSETRON HCL 8 MG PO TABS
8.0000 mg | ORAL_TABLET | Freq: Once | ORAL | Status: AC
  Administered 2013-08-04: 8 mg via ORAL

## 2013-08-04 MED ORDER — BORTEZOMIB CHEMO SQ INJECTION 3.5 MG (2.5MG/ML)
1.3000 mg/m2 | Freq: Once | INTRAMUSCULAR | Status: AC
  Administered 2013-08-04: 3.25 mg via SUBCUTANEOUS
  Filled 2013-08-04: qty 3.25

## 2013-08-04 MED ORDER — ONDANSETRON HCL 8 MG PO TABS
ORAL_TABLET | ORAL | Status: AC
  Filled 2013-08-04: qty 1

## 2013-08-04 NOTE — Patient Instructions (Signed)
[facility] Discharge Instructions for Patients Receiving Chemotherapy  Today you received the following chemotherapy agents: velcade  To help prevent nausea and vomiting after your treatment, we encourage you to take your nausea medication.  Take it as often as prescribed.     If you develop nausea and vomiting that is not controlled by your nausea medication, call the clinic. If it is after clinic hours your family physician or the after hours number for the clinic or go to the Emergency Department.   BELOW ARE SYMPTOMS THAT SHOULD BE REPORTED IMMEDIATELY:  *FEVER GREATER THAN 100.5 F  *CHILLS WITH OR WITHOUT FEVER  NAUSEA AND VOMITING THAT IS NOT CONTROLLED WITH YOUR NAUSEA MEDICATION  *UNUSUAL SHORTNESS OF BREATH  *UNUSUAL BRUISING OR BLEEDING  TENDERNESS IN MOUTH AND THROAT WITH OR WITHOUT PRESENCE OF ULCERS  *URINARY PROBLEMS  *BOWEL PROBLEMS  UNUSUAL RASH Items with * indicate a potential emergency and should be followed up as soon as possible.  Feel free to call the clinic you have any questions or concerns. The clinic phone number is [phone].   I have been informed and understand all the instructions given to me. I know to contact the clinic, my physician, or go to the Emergency Department if any problems should occur. I do not have any questions at this time, but understand that I may call the clinic during office hours   should I have any questions or need assistance in obtaining follow up care.    __________________________________________  _____________  __________ Signature of Patient or Authorized Representative            Date                   Time    __________________________________________ Nurse's Signature    

## 2013-08-07 ENCOUNTER — Other Ambulatory Visit: Payer: Self-pay | Admitting: *Deleted

## 2013-08-09 ENCOUNTER — Ambulatory Visit (HOSPITAL_BASED_OUTPATIENT_CLINIC_OR_DEPARTMENT_OTHER): Payer: No Typology Code available for payment source | Admitting: Hematology and Oncology

## 2013-08-09 ENCOUNTER — Other Ambulatory Visit (HOSPITAL_BASED_OUTPATIENT_CLINIC_OR_DEPARTMENT_OTHER): Payer: No Typology Code available for payment source | Admitting: Lab

## 2013-08-09 ENCOUNTER — Ambulatory Visit (HOSPITAL_BASED_OUTPATIENT_CLINIC_OR_DEPARTMENT_OTHER): Payer: No Typology Code available for payment source

## 2013-08-09 ENCOUNTER — Other Ambulatory Visit: Payer: Self-pay | Admitting: Hematology and Oncology

## 2013-08-09 ENCOUNTER — Telehealth: Payer: Self-pay | Admitting: Hematology and Oncology

## 2013-08-09 VITALS — BP 147/90 | HR 95 | Temp 98.3°F | Resp 20 | Ht 75.0 in | Wt 274.5 lb

## 2013-08-09 DIAGNOSIS — C9002 Multiple myeloma in relapse: Secondary | ICD-10-CM

## 2013-08-09 DIAGNOSIS — D696 Thrombocytopenia, unspecified: Secondary | ICD-10-CM

## 2013-08-09 DIAGNOSIS — D729 Disorder of white blood cells, unspecified: Secondary | ICD-10-CM

## 2013-08-09 DIAGNOSIS — Z5112 Encounter for antineoplastic immunotherapy: Secondary | ICD-10-CM

## 2013-08-09 DIAGNOSIS — D72819 Decreased white blood cell count, unspecified: Secondary | ICD-10-CM

## 2013-08-09 LAB — CBC WITH DIFFERENTIAL/PLATELET
Basophils Absolute: 0 10*3/uL (ref 0.0–0.1)
EOS%: 1.5 % (ref 0.0–7.0)
HCT: 36.8 % — ABNORMAL LOW (ref 38.4–49.9)
HGB: 13.1 g/dL (ref 13.0–17.1)
LYMPH%: 15.6 % (ref 14.0–49.0)
MCH: 32.1 pg (ref 27.2–33.4)
MCV: 90.2 fL (ref 79.3–98.0)
MONO%: 5 % (ref 0.0–14.0)
NEUT%: 77.4 % — ABNORMAL HIGH (ref 39.0–75.0)
Platelets: 107 10*3/uL — ABNORMAL LOW (ref 140–400)
lymph#: 0.6 10*3/uL — ABNORMAL LOW (ref 0.9–3.3)

## 2013-08-09 LAB — COMPREHENSIVE METABOLIC PANEL (CC13)
AST: 20 U/L (ref 5–34)
Albumin: 3.4 g/dL — ABNORMAL LOW (ref 3.5–5.0)
Alkaline Phosphatase: 59 U/L (ref 40–150)
Creatinine: 0.9 mg/dL (ref 0.7–1.3)
Glucose: 125 mg/dl (ref 70–140)
Potassium: 3.3 mEq/L — ABNORMAL LOW (ref 3.5–5.1)
Sodium: 141 mEq/L (ref 136–145)
Total Bilirubin: 1 mg/dL (ref 0.20–1.20)
Total Protein: 6.6 g/dL (ref 6.4–8.3)

## 2013-08-09 MED ORDER — ONDANSETRON HCL 8 MG PO TABS
ORAL_TABLET | ORAL | Status: AC
  Filled 2013-08-09: qty 1

## 2013-08-09 MED ORDER — ONDANSETRON HCL 8 MG PO TABS
8.0000 mg | ORAL_TABLET | Freq: Once | ORAL | Status: AC
  Administered 2013-08-09: 8 mg via ORAL

## 2013-08-09 MED ORDER — ACYCLOVIR 400 MG PO TABS: 400.0000 mg | ORAL_TABLET | Freq: Two times a day (BID) | ORAL | Status: DC

## 2013-08-09 MED ORDER — BORTEZOMIB CHEMO SQ INJECTION 3.5 MG (2.5MG/ML)
1.3000 mg/m2 | Freq: Once | INTRAMUSCULAR | Status: AC
  Administered 2013-08-09: 3.25 mg via SUBCUTANEOUS
  Filled 2013-08-09: qty 3.25

## 2013-08-09 NOTE — Progress Notes (Signed)
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 The patient subsequently was found to have recurrence of disease with multiple myeloma and will set up with combination chemotherapy with Revlimid, and dexamethasone and Velcade since June of 2014.  INTERVAL HISTORY: Austin Griffith 52 y.o. male returns for further followup. He did not show up for his bone marrow aspirate and biopsy last rate and never called to reschedule. The patient does not know the plan for bone marrow transplant. He denies any side effects from treatment. Denies any peripheral neuropathy. He was not prescribed IV bisphosphonate and went to see his dentist and obtain dental clearance. The patient denies any recent signs or symptoms of bleeding such as spontaneous epistaxis, hematuria or hematochezia. He denies any recent fever, chills, night sweats or abnormal weight loss   I have reviewed the past medical history, past surgical history, social history and family history with the patient and they are unchanged from previous note.  ALLERGIES:  has No Known Allergies.  MEDICATIONS:  Current Outpatient Prescriptions  Medication Sig Dispense Refill  . acyclovir (ZOVIRAX) 400 MG tablet Take 1 tablet (400 mg total) by mouth 2 (two) times daily.  60 tablet  3  . cholecalciferol  (VITAMIN D) 1000 UNITS tablet Take 1 tablet (1,000 Units total) by mouth daily.  90 tablet  3  . cycloSPORINE (RESTASIS) 0.05 % ophthalmic emulsion Place 1 drop into both eyes 2 (two) times daily.      Marland Kitchen dexamethasone (DECADRON) 4 MG tablet Take 40 mg by mouth once a week.      . enoxaparin (LOVENOX) 40 MG/0.4ML injection Inject 0.4 mLs (40 mg total) into the skin daily.  30 Syringe  3  . escitalopram (LEXAPRO) 20 MG tablet Take 10 mg by mouth daily.      Marland Kitchen HYDROcodone-acetaminophen (NORCO) 7.5-325 MG per tablet Take 1 tablet by mouth every 6 (six) hours as needed for pain.  60 tablet  0  . ibuprofen (ADVIL,MOTRIN) 200 MG tablet Take 600 mg by mouth every 6 (six) hours as needed for pain.      Marland Kitchen lactulose (CHRONULAC) 10 GM/15ML solution Take 45 mLs (30 g total) by mouth 3 (three) times daily as needed (severe constipation.).  240 mL  0  . lansoprazole (PREVACID) 15 MG capsule Take 15 mg by mouth daily.      Marland Kitchen lenalidomide (REVLIMID) 25 MG capsule Take 1 capsule (25 mg total) by mouth daily.  21 capsule  0  . LORazepam (ATIVAN) 1 MG tablet Take 1 tablet (1 mg total) by mouth every 8 (eight) hours as needed for anxiety.  30 tablet  1  . naproxen sodium (ALEVE) 220 MG tablet Take 220 mg by mouth 2 (two) times daily as needed (Pain).      Marland Kitchen olmesartan (BENICAR) 20 MG tablet Take 20 mg by mouth every morning.       . ondansetron (ZOFRAN) 8 MG tablet Take 1  tablet (8 mg total) by mouth every 8 (eight) hours as needed for nausea.  20 tablet  2  . prochlorperazine (COMPAZINE) 10 MG tablet Take 1 tablet (10 mg total) by mouth every 6 (six) hours as needed (Nausea or vomiting).  30 tablet  1  . triamcinolone (NASACORT) 55 MCG/ACT nasal inhaler Place 2 sprays into the nose daily.       No current facility-administered medications for this visit.    REVIEW OF SYSTEMS:   Constitutional: Denies fevers, chills or abnormal weight loss Eyes: Denies blurriness of vision Ears, nose, mouth, throat, and face:  Denies mucositis or sore throat Respiratory: Denies cough, dyspnea or wheezes Cardiovascular: Denies palpitation, chest discomfort or lower extremity swelling Gastrointestinal:  Denies nausea, heartburn or change in bowel habits Skin: Denies abnormal skin rashes Lymphatics: Denies new lymphadenopathy or easy bruising Neurological:Denies numbness, tingling or new weaknesses Behavioral/Psych: Mood is stable, no new changes  All other systems were reviewed with the patient and are negative.  PHYSICAL EXAMINATION: ECOG PERFORMANCE STATUS: 0 - Asymptomatic  Filed Vitals:   08/09/13 1119  BP: 147/90  Pulse: 95  Temp: 98.3 F (36.8 C)  Resp: 20   Filed Weights   08/09/13 1119  Weight: 274 lb 8 oz (124.512 kg)    GENERAL:alert, no distress and comfortable. Patient is morbidly obese SKIN: skin color, texture, turgor are normal, no rashes or significant lesions. He has very mild acneform rash on his face EYES: normal, Conjunctiva are pink and non-injected, sclera clear OROPHARYNX:no exudate, no erythema and lips, buccal mucosa, and tongue normal  NECK: supple, thyroid normal size, non-tender, without nodularity LYMPH:  no palpable lymphadenopathy in the cervical, axillary or inguinal LUNGS: clear to auscultation and percussion with normal breathing effort HEART: regular rate & rhythm and no murmurs and no lower extremity edema ABDOMEN:abdomen soft, non-tender and normal bowel sounds Musculoskeletal:no cyanosis of digits and no clubbing  NEURO: alert & oriented x 3 with fluent speech, no focal motor/sensory deficits  LABORATORY DATA:  I have reviewed the data as listed    Component Value Date/Time   NA 140 08/04/2013 1216   NA 138 03/03/2013 1434   NA 138 05/20/2012 1105   K 4.1 08/04/2013 1216   K 4.5 03/03/2013 1434   K 4.5 05/20/2012 1105   CL 105 03/30/2013 1340   CL 101 03/03/2013 1434   CL 100 05/20/2012 1105   CO2 24 08/04/2013 1216   CO2 27 03/03/2013 1434   CO2 27 05/20/2012  1105   GLUCOSE 86 08/04/2013 1216   GLUCOSE 120* 03/30/2013 1340   GLUCOSE 88 03/03/2013 1434   GLUCOSE 110 05/20/2012 1105   BUN 10.4 08/04/2013 1216   BUN 16 03/03/2013 1434   BUN 15 05/20/2012 1105   CREATININE 0.8 08/04/2013 1216   CREATININE 1.05 03/03/2013 1434   CREATININE 1.0 05/20/2012 1105   CALCIUM 9.1 08/04/2013 1216   CALCIUM 9.4 03/03/2013 1434   CALCIUM 9.0 05/20/2012 1105   PROT 6.6 08/04/2013 1216   PROT 7.1 03/03/2013 1434   PROT 6.8 05/20/2012 1105   ALBUMIN 3.4* 08/04/2013 1216   ALBUMIN 4.1 03/03/2013 1434   AST 17 08/04/2013 1216   AST 37 03/03/2013 1434   AST 30 05/20/2012 1105   ALT 27 08/04/2013 1216   ALT 51 03/03/2013 1434   ALT 52* 05/20/2012 1105   ALKPHOS 60 08/04/2013 1216   ALKPHOS 91 03/03/2013 1434   ALKPHOS 58 05/20/2012 1105   BILITOT 1.11 08/04/2013 1216  BILITOT 0.8 03/03/2013 1434   BILITOT 0.90 05/20/2012 1105   GFRNONAA 82* 03/03/2013 1434   GFRAA >90 03/03/2013 1434    No results found for this basename: SPEP, UPEP,  kappa and lambda light chains    Lab Results  Component Value Date   WBC 4.0 08/09/2013   NEUTROABS 3.1 08/09/2013   HGB 13.1 08/09/2013   HCT 36.8* 08/09/2013   MCV 90.2 08/09/2013   PLT 107* 08/09/2013      Chemistry      Component Value Date/Time   NA 140 08/04/2013 1216   NA 138 03/03/2013 1434   NA 138 05/20/2012 1105   K 4.1 08/04/2013 1216   K 4.5 03/03/2013 1434   K 4.5 05/20/2012 1105   CL 105 03/30/2013 1340   CL 101 03/03/2013 1434   CL 100 05/20/2012 1105   CO2 24 08/04/2013 1216   CO2 27 03/03/2013 1434   CO2 27 05/20/2012 1105   BUN 10.4 08/04/2013 1216   BUN 16 03/03/2013 1434   BUN 15 05/20/2012 1105   CREATININE 0.8 08/04/2013 1216   CREATININE 1.05 03/03/2013 1434   CREATININE 1.0 05/20/2012 1105      Component Value Date/Time   CALCIUM 9.1 08/04/2013 1216   CALCIUM 9.4 03/03/2013 1434   CALCIUM 9.0 05/20/2012 1105   ALKPHOS 60 08/04/2013 1216   ALKPHOS 91 03/03/2013 1434   ALKPHOS 58 05/20/2012 1105   AST 17 08/04/2013  1216   AST 37 03/03/2013 1434   AST 30 05/20/2012 1105   ALT 27 08/04/2013 1216   ALT 51 03/03/2013 1434   ALT 52* 05/20/2012 1105   BILITOT 1.11 08/04/2013 1216   BILITOT 0.8 03/03/2013 1434   BILITOT 0.90 05/20/2012 1105    ASSESSMENT:  Multiple myeloma  PLAN:  #1 Multiple myeloma He has completed 4 months of chemotherapy. I discussed with the transplant coordinator recently. We will proceed with dexamethasone and Velcade this week. After that we will discontinue. The patient will continue his Revlimid this cycle. He just started a new cycle on 08/04/2013. Ideally he should be on IV Zometa. However since his going for bone marrow transplant, I will hold Zometa until he returns.He'll continue medications with calcium & vitamin D. The patient will continue antimicrobial prophylaxis with acyclovir. He will continue Lovenox for DVT prophylaxis. The patient will repeat an urgent bone marrow aspirate and biopsy to document response. Unfortunately I am fully booked for bone marrow biopsy this week and next week. I will have that scheduled to be done under CT guided biopsy with interventional radiologists. #2 leukopenia This is likely due to recent treatment. The patient denies recent history of fevers, cough, chills, diarrhea or dysuria. He is asymptomatic from the leukopenia. I will observe for now.  I will continue the chemotherapy at current dose without dosage adjustment.  If the leukopenia gets progressive worse in the future, I might have to delay his treatment or adjust the chemotherapy dose. #3 thrombocytopenia This is likely due to recent treatment. The patient denies recent history of bleeding such as epistaxis, hematuria or hematochezia. He is asymptomatic from the low platelet count. I will observe for now.  he does not require transfusion now. I will continue the chemotherapy at current dose without dosage adjustment.  If the thrombocytopenia gets progressive worse in the future, I might have  to delay his treatment or adjust the chemotherapy dose.  His last dose of chemotherapy is today. I have not made a return appointment  for the patient to come back until after his transplant is completed. I encouraged his wife to give [place] an update once she makes contact with the transplant coordinator this week. Orders Placed This Encounter  Procedures  . CT Biopsy    Standing Status: Future     Number of Occurrences:      Standing Expiration Date: 08/09/2014    Scheduling Instructions:     Multiple myeloma s/p chemo, assess bone marrow response before transplant     Need myeloma FISH, routine cytogenetics on the bone marrow samples    Order Specific Question:  Reason for Exam (SYMPTOM  OR DIAGNOSIS REQUIRED)    Answer:  bone marrow aspirate and biopsy    Order Specific Question:  Preferred imaging location?    Answer:  [facility]   All questions were answered. The patient knows to call the clinic with any problems, questions or concerns. No barriers to learning was detected.    Daryl Quiros, MD 08/09/2013 11:51 AM

## 2013-08-09 NOTE — Telephone Encounter (Signed)
gv and printed appt sched and avs for pt for NOV °

## 2013-08-09 NOTE — Patient Instructions (Signed)
[facility] Discharge Instructions for Patients Receiving Chemotherapy  Today you received the following chemotherapy agents velcade  To help prevent nausea and vomiting after your treatment, we encourage you to take your nausea medication as needed   If you develop nausea and vomiting that is not controlled by your nausea medication, call the clinic.   BELOW ARE SYMPTOMS THAT SHOULD BE REPORTED IMMEDIATELY:  *FEVER GREATER THAN 100.5 F  *CHILLS WITH OR WITHOUT FEVER  NAUSEA AND VOMITING THAT IS NOT CONTROLLED WITH YOUR NAUSEA MEDICATION  *UNUSUAL SHORTNESS OF BREATH  *UNUSUAL BRUISING OR BLEEDING  TENDERNESS IN MOUTH AND THROAT WITH OR WITHOUT PRESENCE OF ULCERS  *URINARY PROBLEMS  *BOWEL PROBLEMS  UNUSUAL RASH Items with * indicate a potential emergency and should be followed up as soon as possible.  Feel free to call the clinic you have any questions or concerns. The clinic phone number is [phone].    

## 2013-08-10 ENCOUNTER — Telehealth: Payer: Self-pay | Admitting: *Deleted

## 2013-08-10 NOTE — Telephone Encounter (Signed)
VM from Shelly Coss at [facility] requesting BMBx results faxed to them.  Called back and left VM informing pt missed his appt for BMBx last week and it has been r/s to next week on 11/07. We will fax results when available.

## 2013-08-11 NOTE — Progress Notes (Signed)
Office to receive order for BMBX; please cancel this order, patient is to be scheduled for work up with Dr. Barbaraann Boys @ [facility], per Vinnie Langton [phone]).

## 2013-08-15 ENCOUNTER — Other Ambulatory Visit: Payer: Self-pay | Admitting: Radiology

## 2013-08-18 ENCOUNTER — Ambulatory Visit (HOSPITAL_COMMUNITY)
Admission: RE | Admit: 2013-08-18 | Discharge: 2013-08-18 | Disposition: A | Discharge status: Home / Self Care | Payer: No Typology Code available for payment source | Source: Ambulatory Visit | Attending: Hematology and Oncology | Admitting: Hematology and Oncology

## 2013-08-18 ENCOUNTER — Ambulatory Visit (HOSPITAL_COMMUNITY): Admission: RE | Admit: 2013-08-18 | Payer: No Typology Code available for payment source | Source: Ambulatory Visit

## 2013-08-25 ENCOUNTER — Encounter: Payer: Self-pay | Admitting: *Deleted

## 2013-08-25 NOTE — Progress Notes (Signed)
RECEIVED A FAX FROM [facility] CONCERNING A PRESCRIPTION REFILL REQUEST FOR REVLIMID. PT. IS GOING FOR HIS TRANSPLANT. NOTIFIED [facility].

## 2013-08-31 ENCOUNTER — Other Ambulatory Visit: Payer: Self-pay | Admitting: Hematology and Oncology

## 2013-08-31 ENCOUNTER — Telehealth: Payer: Self-pay | Admitting: *Deleted

## 2013-08-31 DIAGNOSIS — F419 Anxiety disorder, unspecified: Secondary | ICD-10-CM

## 2013-08-31 MED ORDER — LORAZEPAM 1 MG PO TABS: 1.0000 mg | ORAL_TABLET | Freq: Two times a day (BID) | ORAL | Status: DC

## 2013-08-31 NOTE — Telephone Encounter (Signed)
Refill called into [facility], wife notified

## 2013-08-31 NOTE — Telephone Encounter (Signed)
Patient's wife called to say patient needs a refill on Ativan, requesting 60 tablets. States he was told by Dr Mellody Memos at [facility] to get the refill from Dr Bertis Ruddy, Leanora Ivanoff 1 tablet per day, occasionally takes 2 per day.

## 2013-09-01 ENCOUNTER — Other Ambulatory Visit: Payer: Self-pay | Admitting: *Deleted

## 2013-09-01 DIAGNOSIS — F419 Anxiety disorder, unspecified: Secondary | ICD-10-CM

## 2013-09-01 MED ORDER — LORAZEPAM 1 MG PO TABS: 1.0000 mg | ORAL_TABLET | Freq: Two times a day (BID) | ORAL | Status: DC

## 2013-09-05 ENCOUNTER — Other Ambulatory Visit: Payer: Self-pay | Admitting: Hematology and Oncology

## 2013-09-05 ENCOUNTER — Telehealth: Payer: Self-pay | Admitting: Hematology and Oncology

## 2013-09-05 ENCOUNTER — Telehealth: Payer: Self-pay | Admitting: *Deleted

## 2013-09-05 DIAGNOSIS — C9002 Multiple myeloma in relapse: Secondary | ICD-10-CM

## 2013-09-05 NOTE — Telephone Encounter (Signed)
Orders received from [facility].  They request lab on 12/02 and 12/08 w/ Neupogen starting daily on 12/02 though 12/08.   Called Transplant Coordinator, Shelly Coss, and left VM to clarify orders and inform we are not open to give Neupogen on Sunday 12/07.  PH# [phone]. FAX #[phone].  Requested call back.

## 2013-09-05 NOTE — Telephone Encounter (Signed)
SW. PT WIFE AND ADVISED ON 12.2.14 APPT.Marland KitchenMarland KitchenPT STATED THAT HE WAS GOIN TO GIVE OWN INJ AT HOME AND WAS GOING TO [facility] FOR CLASS..THEY WILL ADVISED dR. gORSU AT NXT VISIT

## 2013-09-05 NOTE — Telephone Encounter (Signed)
VM from Shelly Coss at [facility].  She clarified Neupogen orders 1200 mcg [place] daily on 12/02 thru 12/08.  They will administer on Sunday 12/07.  CMP and CBC on 12/02 12/03, 12/05 and 12/08.  Copy of transfusion parameters given to Dr. Bertis Ruddy.

## 2013-09-11 ENCOUNTER — Telehealth: Payer: Self-pay | Admitting: *Deleted

## 2013-09-11 NOTE — Telephone Encounter (Signed)
Opened phone note in error.  Reviewing schedule only

## 2013-09-12 ENCOUNTER — Ambulatory Visit: Payer: No Typology Code available for payment source

## 2013-09-12 ENCOUNTER — Telehealth: Payer: Self-pay | Admitting: Hematology and Oncology

## 2013-09-12 ENCOUNTER — Encounter: Payer: Self-pay | Admitting: Hematology and Oncology

## 2013-09-12 ENCOUNTER — Other Ambulatory Visit (HOSPITAL_BASED_OUTPATIENT_CLINIC_OR_DEPARTMENT_OTHER): Payer: No Typology Code available for payment source | Admitting: Lab

## 2013-09-12 ENCOUNTER — Ambulatory Visit (HOSPITAL_BASED_OUTPATIENT_CLINIC_OR_DEPARTMENT_OTHER): Payer: No Typology Code available for payment source | Admitting: Hematology and Oncology

## 2013-09-12 VITALS — BP 132/82 | HR 90 | Temp 98.2°F | Resp 18 | Ht 75.0 in | Wt 273.2 lb

## 2013-09-12 DIAGNOSIS — D72819 Decreased white blood cell count, unspecified: Secondary | ICD-10-CM

## 2013-09-12 DIAGNOSIS — R110 Nausea: Secondary | ICD-10-CM

## 2013-09-12 DIAGNOSIS — E860 Dehydration: Secondary | ICD-10-CM

## 2013-09-12 DIAGNOSIS — C9002 Multiple myeloma in relapse: Secondary | ICD-10-CM

## 2013-09-12 DIAGNOSIS — C9000 Multiple myeloma not having achieved remission: Secondary | ICD-10-CM

## 2013-09-12 DIAGNOSIS — D696 Thrombocytopenia, unspecified: Secondary | ICD-10-CM

## 2013-09-12 HISTORY — DX: Nausea: R11.0

## 2013-09-12 LAB — COMPREHENSIVE METABOLIC PANEL (CC13)
ALT: 27 U/L (ref 0–55)
Alkaline Phosphatase: 66 U/L (ref 40–150)
Anion Gap: 10 mEq/L (ref 3–11)
CO2: 24 mEq/L (ref 22–29)
Calcium: 9 mg/dL (ref 8.4–10.4)
Chloride: 106 mEq/L (ref 98–109)
Creatinine: 0.8 mg/dL (ref 0.7–1.3)
Glucose: 113 mg/dl (ref 70–140)
Sodium: 140 mEq/L (ref 136–145)
Total Protein: 6.4 g/dL (ref 6.4–8.3)

## 2013-09-12 LAB — CBC WITH DIFFERENTIAL/PLATELET
Basophils Absolute: 0 10*3/uL (ref 0.0–0.1)
Eosinophils Absolute: 0.1 10*3/uL (ref 0.0–0.5)
HCT: 37.8 % — ABNORMAL LOW (ref 38.4–49.9)
HGB: 12.8 g/dL — ABNORMAL LOW (ref 13.0–17.1)
LYMPH%: 4.8 % — ABNORMAL LOW (ref 14.0–49.0)
MCH: 32.8 pg (ref 27.2–33.4)
MCHC: 33.8 g/dL (ref 32.0–36.0)
MONO#: 0 10*3/uL — ABNORMAL LOW (ref 0.1–0.9)
NEUT#: 2.5 10*3/uL (ref 1.5–6.5)
NEUT%: 90.8 % — ABNORMAL HIGH (ref 39.0–75.0)
Platelets: 86 10*3/uL — ABNORMAL LOW (ref 140–400)
WBC: 2.8 10*3/uL — ABNORMAL LOW (ref 4.0–10.3)
lymph#: 0.1 10*3/uL — ABNORMAL LOW (ref 0.9–3.3)

## 2013-09-12 MED ORDER — PROCHLORPERAZINE MALEATE 10 MG PO TABS
ORAL_TABLET | ORAL | Status: AC
  Filled 2013-09-12: qty 1

## 2013-09-12 MED ORDER — PROCHLORPERAZINE MALEATE 10 MG PO TABS
10.0000 mg | ORAL_TABLET | Freq: Once | ORAL | Status: AC
  Administered 2013-09-12: 10 mg via ORAL

## 2013-09-12 NOTE — Progress Notes (Signed)
[facility] OFFICE PROGRESS NOTE  Patient Care Team: Corrie Mckusick, MD as PCP - General (Family Medicine) Eddie Candle as PCP - Hematology/Oncology (Hematology and Oncology) Suzanna Obey, MD as Attending Physician (Otolaryngology) Lonie Peak, MD (Radiation Oncology) Reather Littler, MD as Attending Physician (Endocrinology) Theda Belfast, MD as Attending Physician (Gastroenterology) Artis Delay, MD as Consulting Physician (Hematology and Oncology)  DIAGNOSIS: Multiple myeloma, kappa light chain, seen in anticipation for bone marrow transplant in the near future  SUMMARY OF ONCOLOGIC HISTORY: This is a gentleman who initially presented with 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 The patient subsequently was found to have recurrence of disease with multiple myeloma and will set up with combination chemotherapy with Revlimid, and dexamethasone and Velcade since June of 2014.  INTERVAL HISTORY: Austin Griffith 52 y.o. male returns for further followup. He was recently seen at [facility] and currently is in the process of getting Neupogen stimulation and stem cell collection in the near future. He was instructed to take Neupogen 1200 mcg subcutaneous injection daily stopped today. He complained of nausea but no vomiting. Denies any recent constipation. The patient denies any recent signs or symptoms of bleeding such as spontaneous epistaxis, hematuria or hematochezia. He denies any recent fever, chills, night sweats or abnormal weight loss Denies any dysuria, urinary frequency or urgency. The patient felt a bit dizzy. He thinks he is dehydrated.  I have reviewed the past medical history, past surgical history, social history and family history with the patient and they are unchanged from previous note.  ALLERGIES:  has No Known Allergies.  MEDICATIONS:  Current Outpatient Prescriptions  Medication Sig  Dispense Refill  . acyclovir (ZOVIRAX) 400 MG tablet Take 1 tablet (400 mg total) by mouth 2 (two) times daily.  60 tablet  3  . ciprofloxacin (CIPRO) 500 MG tablet Take 500 mg by mouth 2 (two) times daily.      . cycloSPORINE (RESTASIS) 0.05 % ophthalmic emulsion Place 1 drop into both eyes 2 (two) times daily.      Marland Kitchen escitalopram (LEXAPRO) 20 MG tablet Take 10 mg by mouth daily.      . lansoprazole (PREVACID) 15 MG capsule Take 15 mg by mouth daily.      Marland Kitchen olmesartan (BENICAR) 20 MG tablet Take 20 mg by mouth every morning.       . potassium chloride (K-DUR,KLOR-CON) 10 MEQ tablet Take 10 mEq by mouth daily.      Marland Kitchen LORazepam (ATIVAN) 1 MG tablet Take 1 tablet (1 mg total) by mouth 2 (two) times daily.  60 tablet  2  . naproxen sodium (ALEVE) 220 MG tablet Take 220 mg by mouth 2 (two) times daily as needed (Pain).      . ondansetron (ZOFRAN) 8 MG tablet Take 1 tablet (8 mg total) by mouth every 8 (eight) hours as needed for nausea.  20 tablet  2  . prochlorperazine (COMPAZINE) 10 MG tablet Take 1 tablet (10 mg total) by mouth every 6 (six) hours as needed (Nausea or vomiting).  30 tablet  1  . triamcinolone (NASACORT) 55 MCG/ACT nasal inhaler Place 2 sprays into the nose daily.       No current facility-administered medications for this visit.    REVIEW OF SYSTEMS:   Eyes: Denies blurriness of vision Ears, nose, mouth, throat, and face: Denies mucositis or sore throat Respiratory: Denies cough, dyspnea or wheezes Cardiovascular: Denies  palpitation, chest discomfort or lower extremity swelling Skin: Denies abnormal skin rashes Lymphatics: Denies new lymphadenopathy or easy bruising Neurological:Denies numbness, tingling or new weaknesses Behavioral/Psych: Mood is stable, no new changes  All other systems were reviewed with the patient and are negative.  PHYSICAL EXAMINATION: ECOG PERFORMANCE STATUS: 1 - Symptomatic but completely ambulatory  Filed Vitals:   09/12/13 1255  BP: 132/82   Pulse: 90  Temp: 98.2 F (36.8 C)  Resp: 18   Filed Weights   09/12/13 1255  Weight: 273 lb 3.2 oz (123.923 kg)    GENERAL:alert, no distress and comfortable. He is mildly obese with mild cushingoid features SKIN: skin color, texture, turgor are normal, no rashes or significant lesions. Noticed small acneform rash on his face. EYES: normal, Conjunctiva are pink and non-injected, sclera clear OROPHARYNX:no exudate, no erythema and lips, buccal mucosa, and tongue normal  NECK: supple, thyroid normal size, non-tender, without nodularity LYMPH:  no palpable lymphadenopathy in the cervical, axillary or inguinal LUNGS: clear to auscultation and percussion with normal breathing effort HEART: regular rate & rhythm and no murmurs and no lower extremity edema ABDOMEN:abdomen soft, non-tender and normal bowel sounds Musculoskeletal:no cyanosis of digits and no clubbing  NEURO: alert & oriented x 3 with fluent speech, no focal motor/sensory deficits  LABORATORY DATA:  I have reviewed the data as listed    Component Value Date/Time   NA 141 08/09/2013 1109   NA 138 03/03/2013 1434   NA 138 05/20/2012 1105   K 3.3* 08/09/2013 1109   K 4.5 03/03/2013 1434   K 4.5 05/20/2012 1105   CL 105 03/30/2013 1340   CL 101 03/03/2013 1434   CL 100 05/20/2012 1105   CO2 23 08/09/2013 1109   CO2 27 03/03/2013 1434   CO2 27 05/20/2012 1105   GLUCOSE 125 08/09/2013 1109   GLUCOSE 120* 03/30/2013 1340   GLUCOSE 88 03/03/2013 1434   GLUCOSE 110 05/20/2012 1105   BUN 10.1 08/09/2013 1109   BUN 16 03/03/2013 1434   BUN 15 05/20/2012 1105   CREATININE 0.9 08/09/2013 1109   CREATININE 1.05 03/03/2013 1434   CREATININE 1.0 05/20/2012 1105   CALCIUM 9.3 08/09/2013 1109   CALCIUM 9.4 03/03/2013 1434   CALCIUM 9.0 05/20/2012 1105   PROT 6.6 08/09/2013 1109   PROT 7.1 03/03/2013 1434   PROT 6.8 05/20/2012 1105   ALBUMIN 3.4* 08/09/2013 1109   ALBUMIN 4.1 03/03/2013 1434   AST 20 08/09/2013 1109   AST 37 03/03/2013 1434   AST 30  05/20/2012 1105   ALT 36 08/09/2013 1109   ALT 51 03/03/2013 1434   ALT 52* 05/20/2012 1105   ALKPHOS 59 08/09/2013 1109   ALKPHOS 91 03/03/2013 1434   ALKPHOS 58 05/20/2012 1105   BILITOT 1.00 08/09/2013 1109   BILITOT 0.8 03/03/2013 1434   BILITOT 0.90 05/20/2012 1105   GFRNONAA 82* 03/03/2013 1434   GFRAA >90 03/03/2013 1434    No results found for this basename: SPEP,  UPEP,   kappa and lambda light chains    Lab Results  Component Value Date   WBC 2.8* 09/12/2013   NEUTROABS 2.5 09/12/2013   HGB 12.8* 09/12/2013   HCT 37.8* 09/12/2013   MCV 97.2 09/12/2013   PLT 86* 09/12/2013      Chemistry      Component Value Date/Time   NA 141 08/09/2013 1109   NA 138 03/03/2013 1434   NA 138 05/20/2012 1105   K 3.3* 08/09/2013 1109  K 4.5 03/03/2013 1434   K 4.5 05/20/2012 1105   CL 105 03/30/2013 1340   CL 101 03/03/2013 1434   CL 100 05/20/2012 1105   CO2 23 08/09/2013 1109   CO2 27 03/03/2013 1434   CO2 27 05/20/2012 1105   BUN 10.1 08/09/2013 1109   BUN 16 03/03/2013 1434   BUN 15 05/20/2012 1105   CREATININE 0.9 08/09/2013 1109   CREATININE 1.05 03/03/2013 1434   CREATININE 1.0 05/20/2012 1105      Component Value Date/Time   CALCIUM 9.3 08/09/2013 1109   CALCIUM 9.4 03/03/2013 1434   CALCIUM 9.0 05/20/2012 1105   ALKPHOS 59 08/09/2013 1109   ALKPHOS 91 03/03/2013 1434   ALKPHOS 58 05/20/2012 1105   AST 20 08/09/2013 1109   AST 37 03/03/2013 1434   AST 30 05/20/2012 1105   ALT 36 08/09/2013 1109   ALT 51 03/03/2013 1434   ALT 52* 05/20/2012 1105   BILITOT 1.00 08/09/2013 1109   BILITOT 0.8 03/03/2013 1434   BILITOT 0.90 05/20/2012 1105       ASSESSMENT & PLAN:  #1 multiple myeloma His most recent blood work suggests that he has achieved very good partial response. He is currently in the process of getting stem cell collection. We will continue to provide supportive care. #2 nausea and dehydration I recommend the patient to take a nausea medicine round-the-clock. I recommend intravenous fluids  and IV anti-emetics the next few days. He agrees. I will see him next week for further assessment #3 leukopenia This is likely due to recent treatment. The patient denies recent history of fevers, cough, chills, diarrhea or dysuria. He is asymptomatic from the leukopenia. I will observe for now.  He is currently getting Neupogen prophylactic antimicrobial therapy. #4 thrombocytopenia This is likely due to recent treatment. The patient denies recent history of bleeding such as epistaxis, hematuria or hematochezia. He is asymptomatic from the low platelet count. I will observe for now.  he does not require transfusion now.   I will see him next week due to him feeling unwell. All questions were answered. The patient knows to call the clinic with any problems, questions or concerns. No barriers to learning was detected.    Dymon Summerhill, MD 09/12/2013 1:20 PM

## 2013-09-12 NOTE — Telephone Encounter (Signed)
gv pt appt schedule for december.  °

## 2013-09-13 ENCOUNTER — Ambulatory Visit: Payer: No Typology Code available for payment source

## 2013-09-13 ENCOUNTER — Ambulatory Visit (HOSPITAL_BASED_OUTPATIENT_CLINIC_OR_DEPARTMENT_OTHER): Payer: No Typology Code available for payment source

## 2013-09-13 ENCOUNTER — Other Ambulatory Visit (HOSPITAL_BASED_OUTPATIENT_CLINIC_OR_DEPARTMENT_OTHER): Payer: No Typology Code available for payment source | Admitting: Lab

## 2013-09-13 ENCOUNTER — Encounter: Payer: Self-pay | Admitting: *Deleted

## 2013-09-13 VITALS — BP 114/63 | HR 86 | Temp 98.3°F | Resp 18

## 2013-09-13 DIAGNOSIS — R110 Nausea: Secondary | ICD-10-CM

## 2013-09-13 DIAGNOSIS — C888 Other malignant immunoproliferative diseases: Secondary | ICD-10-CM

## 2013-09-13 DIAGNOSIS — C9002 Multiple myeloma in relapse: Secondary | ICD-10-CM

## 2013-09-13 LAB — CBC WITH DIFFERENTIAL/PLATELET
BASO%: 0.3 % (ref 0.0–2.0)
EOS%: 1 % (ref 0.0–7.0)
MCH: 33.6 pg — ABNORMAL HIGH (ref 27.2–33.4)
MCHC: 34.7 g/dL (ref 32.0–36.0)
MONO%: 0.6 % (ref 0.0–14.0)
RDW: 14.1 % (ref 11.0–14.6)
WBC: 4.2 10*3/uL (ref 4.0–10.3)
lymph#: 0.1 10*3/uL — ABNORMAL LOW (ref 0.9–3.3)

## 2013-09-13 LAB — COMPREHENSIVE METABOLIC PANEL (CC13)
ALT: 23 U/L (ref 0–55)
AST: 15 U/L (ref 5–34)
Albumin: 3.4 g/dL — ABNORMAL LOW (ref 3.5–5.0)
Alkaline Phosphatase: 70 U/L (ref 40–150)
BUN: 14.7 mg/dL (ref 7.0–26.0)
Calcium: 9 mg/dL (ref 8.4–10.4)
Chloride: 106 mEq/L (ref 98–109)
Potassium: 3.9 mEq/L (ref 3.5–5.1)
Sodium: 140 mEq/L (ref 136–145)
Total Bilirubin: 1.8 mg/dL — ABNORMAL HIGH (ref 0.20–1.20)
Total Protein: 6.3 g/dL — ABNORMAL LOW (ref 6.4–8.3)

## 2013-09-13 MED ORDER — ONDANSETRON 8 MG/NS 50 ML IVPB
INTRAVENOUS | Status: AC
  Filled 2013-09-13: qty 8

## 2013-09-13 MED ORDER — HEPARIN SOD (PORK) LOCK FLUSH 100 UNIT/ML IV SOLN
250.0000 [IU] | Freq: Once | INTRAVENOUS | Status: AC
  Administered 2013-09-13: 250 [IU] via INTRAVENOUS
  Filled 2013-09-13: qty 5

## 2013-09-13 MED ORDER — SODIUM CHLORIDE 0.9 % IV SOLN
Freq: Every day | INTRAVENOUS | Status: DC
  Administered 2013-09-13: 12:00:00 via INTRAVENOUS

## 2013-09-13 MED ORDER — ONDANSETRON 8 MG/50ML IVPB (CHCC)
8.0000 mg | Freq: Every day | INTRAVENOUS | Status: DC
  Administered 2013-09-13: 8 mg via INTRAVENOUS

## 2013-09-13 MED ORDER — SODIUM CHLORIDE 0.9 % IJ SOLN
10.0000 mL | Freq: Once | INTRAMUSCULAR | Status: AC
  Administered 2013-09-13: 10 mL via INTRAVENOUS
  Filled 2013-09-13: qty 10

## 2013-09-13 NOTE — Progress Notes (Signed)
Patient discharged to home after IVF and zofran infusions. VSS, patient AAO, ambulating independently. Nausea resolved at time of discharge, patient ate and drank while in infusion room. AVS given, patient to return to infusion room tomorrow for day 2 of 3 IVF.

## 2013-09-13 NOTE — Patient Instructions (Signed)
Dehydration, Adult Dehydration is when you lose more fluids from the body than you take in. Vital organs like the kidneys, brain, and heart cannot function without a proper amount of fluids and salt. Any loss of fluids from the body can cause dehydration.  CAUSES   Vomiting.  Diarrhea.  Excessive sweating.  Excessive urine output.  Fever. SYMPTOMS  Mild dehydration  Thirst.  Dry lips.  Slightly dry mouth. Moderate dehydration  Very dry mouth.  Sunken eyes.  Skin does not bounce back quickly when lightly pinched and released.  Dark urine and decreased urine production.  Decreased tear production.  Headache. Severe dehydration  Very dry mouth.  Extreme thirst.  Rapid, weak pulse (more than 100 beats per minute at rest).  Cold hands and feet.  Not able to sweat in spite of heat and temperature.  Rapid breathing.  Blue lips.  Confusion and lethargy.  Difficulty being awakened.  Minimal urine production.  No tears. DIAGNOSIS  Your caregiver will diagnose dehydration based on your symptoms and your exam. Blood and urine tests will help confirm the diagnosis. The diagnostic evaluation should also identify the cause of dehydration. TREATMENT  Treatment of mild or moderate dehydration can often be done at home by increasing the amount of fluids that you drink. It is best to drink small amounts of fluid more often. Drinking too much at one time can make vomiting worse. Refer to the home care instructions below. Severe dehydration needs to be treated at the hospital where you will probably be given intravenous (IV) fluids that contain water and electrolytes. HOME CARE INSTRUCTIONS   Ask your caregiver about specific rehydration instructions.  Drink enough fluids to keep your urine clear or pale yellow.  Drink small amounts frequently if you have nausea and vomiting.  Eat as you normally do.  Avoid:  Foods or drinks high in sugar.  Carbonated  drinks.  Juice.  Extremely hot or cold fluids.  Drinks with caffeine.  Fatty, greasy foods.  Alcohol.  Tobacco.  Overeating.  Gelatin desserts.  Wash your hands well to avoid spreading bacteria and viruses.  Only take over-the-counter or prescription medicines for pain, discomfort, or fever as directed by your caregiver.  Ask your caregiver if you should continue all prescribed and over-the-counter medicines.  Keep all follow-up appointments with your caregiver. SEEK MEDICAL CARE IF:  You have abdominal pain and it increases or stays in one area (localizes).  You have a rash, stiff neck, or severe headache.  You are irritable, sleepy, or difficult to awaken.  You are weak, dizzy, or extremely thirsty. SEEK IMMEDIATE MEDICAL CARE IF:   You are unable to keep fluids down or you get worse despite treatment.  You have frequent episodes of vomiting or diarrhea.  You have blood or green matter (bile) in your vomit.  You have blood in your stool or your stool looks black and tarry.  You have not urinated in 6 to 8 hours, or you have only urinated a small amount of very dark urine.  You have a fever.  You faint. MAKE SURE YOU:   Understand these instructions.  Will watch your condition.  Will get help right away if you are not doing well or get worse. Document Released: 09/28/2005 Document Revised: 12/21/2011 Document Reviewed: 05/18/2011 Exit[employer] Patient Information 2014 [employer].  

## 2013-09-13 NOTE — Progress Notes (Signed)
Faxed lab results from yesterday 12/02 and today 12/03 to [facility] at fax #[phone].

## 2013-09-14 ENCOUNTER — Ambulatory Visit: Payer: No Typology Code available for payment source

## 2013-09-14 ENCOUNTER — Telehealth: Payer: Self-pay | Admitting: *Deleted

## 2013-09-14 NOTE — Telephone Encounter (Signed)
Left Vm for wife informing ok for pt to stay home today.  Keep his appts as scheduled tomorrow and call back if any questions.

## 2013-09-14 NOTE — Telephone Encounter (Signed)
Pt wants to cancel IVFs today.  He says he is drinking plenty of fluids at home.  He plans on coming in tomorrow for lab and IVFs.  Wife asking if ok for pt to miss IVFs today?

## 2013-09-14 NOTE — Telephone Encounter (Signed)
Yes, ok 

## 2013-09-15 ENCOUNTER — Telehealth: Payer: Self-pay | Admitting: *Deleted

## 2013-09-15 ENCOUNTER — Other Ambulatory Visit: Payer: Self-pay | Admitting: Hematology and Oncology

## 2013-09-15 ENCOUNTER — Other Ambulatory Visit (HOSPITAL_BASED_OUTPATIENT_CLINIC_OR_DEPARTMENT_OTHER): Payer: No Typology Code available for payment source

## 2013-09-15 ENCOUNTER — Other Ambulatory Visit: Payer: Self-pay | Admitting: *Deleted

## 2013-09-15 ENCOUNTER — Ambulatory Visit: Payer: No Typology Code available for payment source

## 2013-09-15 ENCOUNTER — Ambulatory Visit (HOSPITAL_BASED_OUTPATIENT_CLINIC_OR_DEPARTMENT_OTHER): Payer: No Typology Code available for payment source

## 2013-09-15 DIAGNOSIS — C9000 Multiple myeloma not having achieved remission: Secondary | ICD-10-CM

## 2013-09-15 DIAGNOSIS — R110 Nausea: Secondary | ICD-10-CM

## 2013-09-15 DIAGNOSIS — C9002 Multiple myeloma in relapse: Secondary | ICD-10-CM

## 2013-09-15 DIAGNOSIS — E860 Dehydration: Secondary | ICD-10-CM

## 2013-09-15 LAB — COMPREHENSIVE METABOLIC PANEL (CC13)
ALT: 20 U/L (ref 0–55)
Albumin: 3.3 g/dL — ABNORMAL LOW (ref 3.5–5.0)
Anion Gap: 10 mEq/L (ref 3–11)
BUN: 15.4 mg/dL (ref 7.0–26.0)
CO2: 23 mEq/L (ref 22–29)
Calcium: 9.3 mg/dL (ref 8.4–10.4)
Chloride: 106 mEq/L (ref 98–109)
Creatinine: 0.8 mg/dL (ref 0.7–1.3)
Glucose: 143 mg/dl — ABNORMAL HIGH (ref 70–140)
Potassium: 3.8 mEq/L (ref 3.5–5.1)
Sodium: 139 mEq/L (ref 136–145)
Total Protein: 6.2 g/dL — ABNORMAL LOW (ref 6.4–8.3)

## 2013-09-15 LAB — CBC WITH DIFFERENTIAL/PLATELET
HCT: 30.3 % — ABNORMAL LOW (ref 38.4–49.9)
HGB: 10.8 g/dL — ABNORMAL LOW (ref 13.0–17.1)
MCV: 94.6 fL (ref 79.3–98.0)
RBC: 3.2 10*6/uL — ABNORMAL LOW (ref 4.20–5.82)
RDW: 13.4 % (ref 11.0–14.6)
WBC: <0.2 10*3/uL — CL (ref 4.0–10.3)

## 2013-09-15 MED ORDER — ONDANSETRON 8 MG/50ML IVPB (CHCC)
8.0000 mg | Freq: Once | INTRAVENOUS | Status: DC
  Administered 2013-09-15: 8 mg via INTRAVENOUS

## 2013-09-15 MED ORDER — ONDANSETRON 8 MG/NS 50 ML IVPB
INTRAVENOUS | Status: AC
  Filled 2013-09-15: qty 8

## 2013-09-15 MED ORDER — SODIUM CHLORIDE 0.9 % IV SOLN
Freq: Once | INTRAVENOUS | Status: DC
  Administered 2013-09-15: 13:00:00 via INTRAVENOUS

## 2013-09-15 NOTE — Telephone Encounter (Signed)
Left VM for wife informing of low WBC reported to [facility] and rash reported to [facility].  Suggested they call BMT coordinator at [facility] if they have not heard from them yet for any further instructions regarding low WBC and rash.  Asked her to call us back if any questions.

## 2013-09-15 NOTE — Patient Instructions (Signed)
Dehydration, Adult Dehydration is when you lose more fluids from the body than you take in. Vital organs like the kidneys, brain, and heart cannot function without a proper amount of fluids and salt. Any loss of fluids from the body can cause dehydration.  CAUSES   Vomiting.  Diarrhea.  Excessive sweating.  Excessive urine output.  Fever. SYMPTOMS  Mild dehydration  Thirst.  Dry lips.  Slightly dry mouth. Moderate dehydration  Very dry mouth.  Sunken eyes.  Skin does not bounce back quickly when lightly pinched and released.  Dark urine and decreased urine production.  Decreased tear production.  Headache. Severe dehydration  Very dry mouth.  Extreme thirst.  Rapid, weak pulse (more than 100 beats per minute at rest).  Cold hands and feet.  Not able to sweat in spite of heat and temperature.  Rapid breathing.  Blue lips.  Confusion and lethargy.  Difficulty being awakened.  Minimal urine production.  No tears. DIAGNOSIS  Your caregiver will diagnose dehydration based on your symptoms and your exam. Blood and urine tests will help confirm the diagnosis. The diagnostic evaluation should also identify the cause of dehydration. TREATMENT  Treatment of mild or moderate dehydration can often be done at home by increasing the amount of fluids that you drink. It is best to drink small amounts of fluid more often. Drinking too much at one time can make vomiting worse. Refer to the home care instructions below. Severe dehydration needs to be treated at the hospital where you will probably be given intravenous (IV) fluids that contain water and electrolytes. HOME CARE INSTRUCTIONS   Ask your caregiver about specific rehydration instructions.  Drink enough fluids to keep your urine clear or pale yellow.  Drink small amounts frequently if you have nausea and vomiting.  Eat as you normally do.  Avoid:  Foods or drinks high in sugar.  Carbonated  drinks.  Juice.  Extremely hot or cold fluids.  Drinks with caffeine.  Fatty, greasy foods.  Alcohol.  Tobacco.  Overeating.  Gelatin desserts.  Wash your hands well to avoid spreading bacteria and viruses.  Only take over-the-counter or prescription medicines for pain, discomfort, or fever as directed by your caregiver.  Ask your caregiver if you should continue all prescribed and over-the-counter medicines.  Keep all follow-up appointments with your caregiver. SEEK MEDICAL CARE IF:  You have abdominal pain and it increases or stays in one area (localizes).  You have a rash, stiff neck, or severe headache.  You are irritable, sleepy, or difficult to awaken.  You are weak, dizzy, or extremely thirsty. SEEK IMMEDIATE MEDICAL CARE IF:   You are unable to keep fluids down or you get worse despite treatment.  You have frequent episodes of vomiting or diarrhea.  You have blood or green matter (bile) in your vomit.  You have blood in your stool or your stool looks black and tarry.  You have not urinated in 6 to 8 hours, or you have only urinated a small amount of very dark urine.  You have a fever.  You faint. MAKE SURE YOU:   Understand these instructions.  Will watch your condition.  Will get help right away if you are not doing well or get worse. Document Released: 09/28/2005 Document Revised: 12/21/2011 Document Reviewed: 05/18/2011 Exit[employer] Patient Information 2014 [employer].  

## 2013-09-15 NOTE — Telephone Encounter (Signed)
Faxed CBC results to Shelly Coss at [facility] at [phone].  Also called and left VM informing of low WBC less than 0.2 at Ph# [phone]. Also informed of red, acne form type rash on pt's face and shoulders.  Requested they call pt w/ instructions.

## 2013-09-16 ENCOUNTER — Ambulatory Visit: Payer: No Typology Code available for payment source

## 2013-09-18 ENCOUNTER — Other Ambulatory Visit: Payer: Self-pay | Admitting: Hematology and Oncology

## 2013-09-18 ENCOUNTER — Encounter: Payer: Self-pay | Admitting: Hematology and Oncology

## 2013-09-18 ENCOUNTER — Ambulatory Visit: Payer: No Typology Code available for payment source

## 2013-09-18 ENCOUNTER — Encounter (HOSPITAL_COMMUNITY): Payer: Self-pay | Admitting: *Deleted

## 2013-09-18 ENCOUNTER — Inpatient Hospital Stay (HOSPITAL_COMMUNITY)
Admission: AD | Admit: 2013-09-18 | Discharge: 2013-09-21 | DRG: 809 | Disposition: A | Discharge status: Home / Self Care | Payer: No Typology Code available for payment source | Source: Ambulatory Visit | Attending: Hematology and Oncology | Admitting: Hematology and Oncology

## 2013-09-18 ENCOUNTER — Ambulatory Visit: Payer: No Typology Code available for payment source | Admitting: Lab

## 2013-09-18 ENCOUNTER — Inpatient Hospital Stay (HOSPITAL_COMMUNITY): Payer: No Typology Code available for payment source

## 2013-09-18 ENCOUNTER — Ambulatory Visit (HOSPITAL_BASED_OUTPATIENT_CLINIC_OR_DEPARTMENT_OTHER): Payer: No Typology Code available for payment source

## 2013-09-18 ENCOUNTER — Other Ambulatory Visit (HOSPITAL_BASED_OUTPATIENT_CLINIC_OR_DEPARTMENT_OTHER): Payer: No Typology Code available for payment source | Admitting: Lab

## 2013-09-18 ENCOUNTER — Encounter: Payer: No Typology Code available for payment source | Admitting: Hematology and Oncology

## 2013-09-18 ENCOUNTER — Telehealth: Payer: Self-pay | Admitting: *Deleted

## 2013-09-18 VITALS — BP 87/59 | HR 110 | Temp 98.7°F | Resp 20

## 2013-09-18 VITALS — BP 80/54 | Temp 98.5°F

## 2013-09-18 DIAGNOSIS — D696 Thrombocytopenia, unspecified: Secondary | ICD-10-CM

## 2013-09-18 DIAGNOSIS — D709 Neutropenia, unspecified: Secondary | ICD-10-CM

## 2013-09-18 DIAGNOSIS — R51 Headache: Secondary | ICD-10-CM | POA: Diagnosis present

## 2013-09-18 DIAGNOSIS — C9002 Multiple myeloma in relapse: Secondary | ICD-10-CM

## 2013-09-18 DIAGNOSIS — T451X5A Adverse effect of antineoplastic and immunosuppressive drugs, initial encounter: Secondary | ICD-10-CM | POA: Diagnosis present

## 2013-09-18 DIAGNOSIS — E876 Hypokalemia: Secondary | ICD-10-CM

## 2013-09-18 DIAGNOSIS — C311 Malignant neoplasm of ethmoidal sinus: Secondary | ICD-10-CM

## 2013-09-18 DIAGNOSIS — I959 Hypotension, unspecified: Secondary | ICD-10-CM | POA: Diagnosis present

## 2013-09-18 DIAGNOSIS — R110 Nausea: Secondary | ICD-10-CM

## 2013-09-18 DIAGNOSIS — Z923 Personal history of irradiation: Secondary | ICD-10-CM

## 2013-09-18 DIAGNOSIS — M549 Dorsalgia, unspecified: Secondary | ICD-10-CM | POA: Diagnosis present

## 2013-09-18 DIAGNOSIS — D702 Other drug-induced agranulocytosis: Principal | ICD-10-CM | POA: Diagnosis present

## 2013-09-18 DIAGNOSIS — C9000 Multiple myeloma not having achieved remission: Secondary | ICD-10-CM | POA: Diagnosis present

## 2013-09-18 DIAGNOSIS — D638 Anemia in other chronic diseases classified elsewhere: Secondary | ICD-10-CM | POA: Diagnosis present

## 2013-09-18 DIAGNOSIS — I10 Essential (primary) hypertension: Secondary | ICD-10-CM | POA: Diagnosis present

## 2013-09-18 DIAGNOSIS — E860 Dehydration: Secondary | ICD-10-CM

## 2013-09-18 DIAGNOSIS — R82998 Other abnormal findings in urine: Secondary | ICD-10-CM | POA: Diagnosis present

## 2013-09-18 DIAGNOSIS — D6959 Other secondary thrombocytopenia: Secondary | ICD-10-CM | POA: Diagnosis present

## 2013-09-18 DIAGNOSIS — R5081 Fever presenting with conditions classified elsewhere: Secondary | ICD-10-CM | POA: Diagnosis present

## 2013-09-18 DIAGNOSIS — R000 Tachycardia, unspecified: Secondary | ICD-10-CM

## 2013-09-18 DIAGNOSIS — R42 Dizziness and giddiness: Secondary | ICD-10-CM | POA: Diagnosis present

## 2013-09-18 DIAGNOSIS — R21 Rash and other nonspecific skin eruption: Secondary | ICD-10-CM | POA: Diagnosis present

## 2013-09-18 DIAGNOSIS — Z96649 Presence of unspecified artificial hip joint: Secondary | ICD-10-CM

## 2013-09-18 DIAGNOSIS — C9001 Multiple myeloma in remission: Secondary | ICD-10-CM

## 2013-09-18 DIAGNOSIS — C888 Other malignant immunoproliferative diseases: Secondary | ICD-10-CM

## 2013-09-18 DIAGNOSIS — D649 Anemia, unspecified: Secondary | ICD-10-CM

## 2013-09-18 HISTORY — DX: Neutropenia, unspecified: D70.9

## 2013-09-18 LAB — CBC WITH DIFFERENTIAL/PLATELET
Basophils Absolute: 0 10*3/uL (ref 0.0–0.1)
EOS%: 0 % (ref 0.0–7.0)
Eosinophils Absolute: 0 10*3/uL (ref 0.0–0.5)
LYMPH%: 14.9 % (ref 14.0–49.0)
MCH: 32.2 pg (ref 27.2–33.4)
MCHC: 36.1 g/dL — ABNORMAL HIGH (ref 32.0–36.0)
MCV: 89.2 fL (ref 79.3–98.0)
MONO#: 0.2 10*3/uL (ref 0.1–0.9)
MONO%: 44.7 % — ABNORMAL HIGH (ref 0.0–14.0)
NEUT#: 0.2 10*3/uL — CL (ref 1.5–6.5)
Platelets: 19 10*3/uL — ABNORMAL LOW (ref 140–400)
RBC: 2.95 10*6/uL — ABNORMAL LOW (ref 4.20–5.82)
WBC: 0.5 10*3/uL — CL (ref 4.0–10.3)
nRBC: 0 % (ref 0–0)

## 2013-09-18 LAB — COMPREHENSIVE METABOLIC PANEL (CC13)
ALT: 28 U/L (ref 0–55)
AST: 18 U/L (ref 5–34)
Anion Gap: 8 mEq/L (ref 3–11)
CO2: 25 mEq/L (ref 22–29)
Creatinine: 0.9 mg/dL (ref 0.7–1.3)
Glucose: 102 mg/dl (ref 70–140)
Total Bilirubin: 1.81 mg/dL — ABNORMAL HIGH (ref 0.20–1.20)

## 2013-09-18 MED ORDER — LORAZEPAM 1 MG PO TABS
1.0000 mg | ORAL_TABLET | Freq: Three times a day (TID) | ORAL | Status: DC | PRN
  Administered 2013-09-19: 1 mg via ORAL
  Filled 2013-09-18 (×2): qty 1

## 2013-09-18 MED ORDER — PROCHLORPERAZINE MALEATE 10 MG PO TABS
10.0000 mg | ORAL_TABLET | Freq: Four times a day (QID) | ORAL | Status: DC | PRN
  Administered 2013-09-19 – 2013-09-20 (×2): 10 mg via ORAL
  Filled 2013-09-18 (×2): qty 1

## 2013-09-18 MED ORDER — FILGRASTIM 480 MCG/1.6ML IJ SOLN
480.0000 ug | Freq: Once | INTRAMUSCULAR | Status: AC
  Administered 2013-09-18: 480 ug via SUBCUTANEOUS
  Filled 2013-09-18: qty 1.6

## 2013-09-18 MED ORDER — HEPARIN SOD (PORK) LOCK FLUSH 100 UNIT/ML IV SOLN
250.0000 [IU] | Freq: Once | INTRAVENOUS | Status: AC
  Administered 2013-09-18: 250 [IU] via INTRAVENOUS
  Filled 2013-09-18: qty 5

## 2013-09-18 MED ORDER — PANTOPRAZOLE SODIUM 20 MG PO TBEC
20.0000 mg | DELAYED_RELEASE_TABLET | Freq: Every day | ORAL | Status: DC
  Administered 2013-09-19 – 2013-09-21 (×3): 20 mg via ORAL
  Filled 2013-09-18 (×3): qty 1

## 2013-09-18 MED ORDER — SENNOSIDES-DOCUSATE SODIUM 8.6-50 MG PO TABS: 1.0000 | ORAL_TABLET | Freq: Two times a day (BID) | ORAL | Status: DC | PRN

## 2013-09-18 MED ORDER — SODIUM CHLORIDE 0.9 % IV SOLN
1250.0000 mg | Freq: Two times a day (BID) | INTRAVENOUS | Status: DC
  Administered 2013-09-19 – 2013-09-20 (×4): 1250 mg via INTRAVENOUS
  Filled 2013-09-18 (×6): qty 1250

## 2013-09-18 MED ORDER — ESCITALOPRAM OXALATE 10 MG PO TABS
10.0000 mg | ORAL_TABLET | Freq: Every day | ORAL | Status: DC
  Administered 2013-09-19 – 2013-09-21 (×3): 10 mg via ORAL
  Filled 2013-09-18 (×3): qty 1

## 2013-09-18 MED ORDER — VANCOMYCIN HCL 10 G IV SOLR
2500.0000 mg | Freq: Once | INTRAVENOUS | Status: AC
  Administered 2013-09-18: 2500 mg via INTRAVENOUS
  Filled 2013-09-18: qty 2500

## 2013-09-18 MED ORDER — MORPHINE SULFATE 10 MG/ML IJ SOLN
10.0000 mg | INTRAMUSCULAR | Status: AC | PRN
  Administered 2013-09-19: 10 mg via INTRAVENOUS
  Filled 2013-09-18: qty 1

## 2013-09-18 MED ORDER — VALACYCLOVIR HCL 500 MG PO TABS
500.0000 mg | ORAL_TABLET | Freq: Two times a day (BID) | ORAL | Status: DC
  Administered 2013-09-18 – 2013-09-21 (×6): 500 mg via ORAL
  Filled 2013-09-18 (×7): qty 1

## 2013-09-18 MED ORDER — FLUTICASONE PROPIONATE 50 MCG/ACT NA SUSP
1.0000 | Freq: Two times a day (BID) | NASAL | Status: DC | PRN
  Filled 2013-09-18: qty 16

## 2013-09-18 MED ORDER — OXYCODONE HCL 5 MG PO TABS
5.0000 mg | ORAL_TABLET | ORAL | Status: DC | PRN
  Administered 2013-09-18 – 2013-09-21 (×7): 5 mg via ORAL
  Filled 2013-09-18 (×7): qty 1

## 2013-09-18 MED ORDER — SODIUM CHLORIDE 0.9 % IJ SOLN
10.0000 mL | Freq: Once | INTRAMUSCULAR | Status: AC
  Administered 2013-09-18: 10 mL
  Filled 2013-09-18: qty 10

## 2013-09-18 MED ORDER — ZOLPIDEM TARTRATE 5 MG PO TABS: 5.0000 mg | ORAL_TABLET | Freq: Every evening | ORAL | Status: DC | PRN

## 2013-09-18 MED ORDER — SODIUM CHLORIDE 0.9 % IV SOLN: INTRAVENOUS | Status: DC

## 2013-09-18 MED ORDER — PIPERACILLIN-TAZOBACTAM 3.375 G IVPB
3.3750 g | Freq: Three times a day (TID) | INTRAVENOUS | Status: DC
Start: 2013-09-18 — End: 2013-09-21
  Administered 2013-09-18 – 2013-09-21 (×8): 3.375 g via INTRAVENOUS
  Filled 2013-09-18 (×10): qty 50

## 2013-09-18 MED ORDER — ALUM & MAG HYDROXIDE-SIMETH 200-200-20 MG/5ML PO SUSP: 60.0000 mL | ORAL | Status: DC | PRN

## 2013-09-18 MED ORDER — SODIUM CHLORIDE 0.9 % IV SOLN
Freq: Once | INTRAVENOUS | Status: AC
  Administered 2013-09-18: 15:00:00 via INTRAVENOUS

## 2013-09-18 MED ORDER — ONDANSETRON HCL 8 MG PO TABS
8.0000 mg | ORAL_TABLET | Freq: Three times a day (TID) | ORAL | Status: DC | PRN
  Administered 2013-09-19 – 2013-09-20 (×3): 8 mg via ORAL
  Filled 2013-09-18 (×3): qty 1

## 2013-09-18 MED ORDER — OXYCODONE HCL 5 MG PO CAPS: 5.0000 mg | ORAL_CAPSULE | ORAL | Status: DC | PRN

## 2013-09-18 MED ORDER — MIDAZOLAM HCL 10 MG/2ML IJ SOLN: 10.0000 mg | Freq: Once | INTRAMUSCULAR | Status: DC

## 2013-09-18 MED ORDER — SODIUM CHLORIDE 0.9 % IV SOLN
INTRAVENOUS | Status: DC
  Administered 2013-09-18 – 2013-09-20 (×3): via INTRAVENOUS

## 2013-09-18 MED ORDER — ACETAMINOPHEN 325 MG PO TABS: 650.0000 mg | ORAL_TABLET | ORAL | Status: DC | PRN

## 2013-09-18 NOTE — Progress Notes (Signed)
Dr. Bertis Ruddy notified of pt's BP readings.

## 2013-09-18 NOTE — H&P (Signed)
[facility] CONSULT NOTE  Patient Care Team: Corrie Mckusick, MD as PCP - General (Family Medicine) Eddie Candle as PCP - Hematology/Oncology (Hematology and Oncology) Suzanna Obey, MD as Attending Physician (Otolaryngology) Lonie Peak, MD (Radiation Oncology) Reather Littler, MD as Attending Physician (Endocrinology) Theda Belfast, MD as Attending Physician (Gastroenterology) Artis Delay, MD as Consulting Physician (Hematology and Oncology)  CHIEF COMPLAINTS/PURPOSE OF CONSULTATION:  Neutropenic fever  HISTORY OF PRESENTING ILLNESS:  Austin Griffith 52 y.o. male is here because of neutropenic fever. This patient had background history of multiple myeloma and was treated with Revlimid, dexamethasone and Velcade. The patient has achieved very good partial response and was seen at [facility] for autologous stem cell transplant. On 09/08/2013, he received high-dose Cytoxan at [facility]. Over the past week, the patient was found to have low-grade fever along with significant body rash. He was on antibiotic for this high-dose ciprofloxacin along with Neupogen injection daily for stem cell collection that was planned for tomorrow. Yesterday, and he develop high grade temperature of 102 and was instructed to go to the emergency department but the patient declined. At the outpatient clinic today, he was noted to be hypotensive with tachycardia and symptoms of dizziness. He denies any dysuria, frequency or urgency. The patient denies any recent signs or symptoms of bleeding such as spontaneous epistaxis, hematuria or hematochezia. The patient had some mild nausea and felt dehydrated. He denies any cough. MEDICAL HISTORY:  Past Medical History  Diagnosis Date  . Hypertension   . Plasmacytoma      Right Ethmoid Sinus; bone marrow biopsy on 03/10/13 showed normal myeloma FISH panel and cytogenetics.   . Hyperlipidemia     diet control   . AVN (avascular necrosis of  bone)     bilateral hips; s/p hip replacement in 1991 and 2009  . Headache(784.0) 02/08/2012    Right Sided Headache Behind Right Eye  . Epistaxis 02/08/2012  . Blurred vision     Present for "many years"  . Fatigue   . Acid reflux   . History of radiation therapy 02/29/12-04/08/12    right  ethmoid sinus in 50.4Gy in 20 fxs  . Fatigue 05/25/12    "Mild"  . Thrombocytopenia 05/25/12  . 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 Bone metastases 03/31/13     MR Abdomen -Lower Thoracic and Upper Lumbar spine  . Liver lesion 03/31/13    MR Abdomen  . Pancreatic lesion 03/31/13     MR Abdomen - Ucinate Process  . On antineoplastic chemotherapy started 04/08/13    Revlimid/ Velcade/ Dex  . S/P radiation therapy 03/28/2013    Left posterior 7th Rib / 8Gy in 1 fraction  . Multiple myeloma, in relapse 07/05/2013  . Anxiety 07/05/2013  . Nausea alone 09/12/2013  . Neutropenic fever 09/18/2013    SURGICAL HISTORY: Past Surgical History  Procedure Laterality Date  . Bilateral hip replacement  1991; and 2009    due to bilateral hip AVN's.   . Mass biopsy  01/26/12    Right Ethmoid Mass - Plasma Cell Neoplasm  . Eus N/A 03/24/2013    Procedure: UPPER ENDOSCOPIC ULTRASOUND (EUS) LINEAR;  Surgeon: Theda Belfast, MD;  Location: [facility];  Service: Endoscopy;  Laterality: N/A;  . Fine needle aspiration N/A 03/24/2013    Procedure: FINE NEEDLE ASPIRATION (FNA) LINEAR;  Surgeon: Theda Belfast, MD;  Location: [facility];  Service: Endoscopy;  Laterality: N/A;    SOCIAL HISTORY:  History   Social History  . Marital Status: Married    Spouse Name: N/A    Number of Children: 2  . Years of Education: N/A   Occupational History  . owner     owns a dump truck company   Social History Main Topics  . Smoking status: Never Smoker   . Smokeless tobacco: Never Used  . Alcohol Use: No     Comment: Hx of Alcohol Abuse. Reported No further intake on 05/25/12  . Drug Use: No  . Sexual  Activity: Yes    Birth Control/ Protection: None   Other Topics Concern  . Not on file   Social History Narrative   Married   Network engineer of [employer]    FAMILY HISTORY: Family History  Problem Relation Age of Onset  . Adopted: Yes    ALLERGIES:  has No Known Allergies.  MEDICATIONS:  Current Outpatient Prescriptions  Medication Sig Dispense Refill  . acyclovir (ZOVIRAX) 400 MG tablet Take 1 tablet (400 mg total) by mouth 2 (two) times daily.  60 tablet  3  . ciprofloxacin (CIPRO) 500 MG tablet Take 500 mg by mouth 2 (two) times daily.      . cycloSPORINE (RESTASIS) 0.05 % ophthalmic emulsion Place 1 drop into both eyes 2 (two) times daily.      Marland Kitchen escitalopram (LEXAPRO) 20 MG tablet Take 10 mg by mouth daily.      . filgrastim (NEUPOGEN) 300 MCG/0.5ML SOLN injection Inject 300 mcg into the skin once. 1200 daily      . lansoprazole (PREVACID) 15 MG capsule Take 15 mg by mouth daily.      Marland Kitchen LORazepam (ATIVAN) 1 MG tablet Take 1 mg by mouth every 8 (eight) hours as needed (nausea).      . olmesartan (BENICAR) 20 MG tablet Take 20 mg by mouth every morning.       . ondansetron (ZOFRAN) 8 MG tablet Take 1 tablet (8 mg total) by mouth every 8 (eight) hours as needed for nausea.  20 tablet  2  . oxycodone (OXY-IR) 5 MG capsule Take 5 mg by mouth every 4 (four) hours as needed for pain.      . potassium chloride (K-DUR,KLOR-CON) 10 MEQ tablet Take 10 mEq by mouth daily.      . prochlorperazine (COMPAZINE) 10 MG tablet Take 1 tablet (10 mg total) by mouth every 6 (six) hours as needed (Nausea or vomiting).  30 tablet  1  . triamcinolone (NASACORT) 55 MCG/ACT nasal inhaler Place 2 sprays into the nose daily.       No current facility-administered medications for this visit.    REVIEW OF SYSTEMS:   Eyes: Denies blurriness of vision, double vision or watery eyes Ears, nose, mouth, throat, and face: Denies mucositis. He complained of mild sore throat Respiratory: Denies cough,  dyspnea or wheezes Cardiovascular: Denies palpitation, chest discomfort or lower extremity swelling Gastrointestinal:  Denies nausea, heartburn or change in bowel habits Skin: His skin rash on his face and back is improving Lymphatics: Denies new lymphadenopathy or easy bruising Neurological:Denies numbness, tingling or new weaknesses Behavioral/Psych: Mood is stable, no new changes  All other systems were reviewed with the patient and are negative.  PHYSICAL EXAMINATION: ECOG PERFORMANCE STATUS: 1 - Symptomatic but completely ambulatory Blood pressure 91/62 Heart rate 103 Temperature 99 Respiration rate 20 GENERAL:alert, no distress and comfortable SKIN: He has significant skin rash on his face and his back but it is improving compared  to last week visit  EYES: normal, conjunctiva are pink and non-injected, sclera clear OROPHARYNX:no exudate, no erythema and lips, buccal mucosa, and tongue normal  NECK: supple, thyroid normal size, non-tender, without nodularity LYMPH:  no palpable lymphadenopathy in the cervical, axillary or inguinal LUNGS: clear to auscultation and percussion with normal breathing effort HEART: regular rate & rhythm and no murmurs and no lower extremity edema. Noted tachycardia ABDOMEN:abdomen soft, non-tender and normal bowel sounds Musculoskeletal:no cyanosis of digits and no clubbing  PSYCH: alert & oriented x 3 with fluent speech NEURO: no focal motor/sensory deficits  LABORATORY DATA:  I have reviewed the data as listed.  Lab Results  Component Value Date   WBC 0.5* 09/18/2013   HGB 9.5* 09/18/2013   HCT 26.3* 09/18/2013   MCV 89.2 09/18/2013   PLT 19* 09/18/2013    ASSESSMENT:  Neutropenic fever  PLAN:  #1 neutropenic fever I have drawn blood culture at the clinic. We will order a urine culture and chest x-ray. I will consult inpatient pharmacy for dosing for intravenous vancomycin and intravenous Zosyn. I will Continue on oral valacyclovir. We will  draw blood work every day until neutropenia resolves. I will start him on Neupogen 480 mcg daily subcutaneous #2 multiple myeloma He has achieved very good partial response. I left a message to the bone marrow transplant center at [facility] to hold his stem cell collection due to neutropenic fever #3 skin rash Cause is unclear but likely be due to folliculitis. Hopefully the broad-spectrum intravenous antibiotics will help with that #4 hypotension #5 tachycardia I am giving him IV fluid resuscitation at the clinic, pending a hospital bed. Once he gets a bit in the hospital, we will continue IV fluids resuscitation. #6 anemia This is likely anemia of chronic disease. The patient denies recent history of bleeding such as epistaxis, hematuria or hematochezia. He is asymptomatic from the anemia. We will observe for now.  He does not require transfusion now.  #7 severe thrombocytopenia This is likely due to recent treatment. The patient denies recent history of bleeding such as epistaxis, hematuria or hematochezia. He is asymptomatic from the low platelet count. I will observe for now.  he does not require transfusion now. If he starts to have bleeding or platelet count less than 10,000, we will proceed with platelet transfusion  At present time, the patient is deemed safe to be treated in the regular hospital bed. However if his condition get worse, he may need ICU intervention. The risk of not undergoing him would be risk of death All questions were answered. The patient knows to call the clinic with any problems, questions or concerns.    Tarius Stangelo, MD 09/18/2013 2:50 PM

## 2013-09-18 NOTE — H&P (Signed)
CHIEF COMPLAINTS/PURPOSE OF ADMISSION:   Neutropenic fever   HISTORY OF PRESENTING ILLNESS:   Austin Griffith 52 y.o. male is here because of neutropenic fever. This patient had background history of multiple myeloma and was treated with Revlimid, dexamethasone and Velcade. The patient has achieved very good partial response and was seen at [facility] for autologous stem cell transplant. On 09/08/2013, he received high-dose Cytoxan at [facility]. Over the past week, the patient was found to have low-grade fever along with significant body rash. He was on antibiotic for this high-dose ciprofloxacin along with Neupogen injection daily for stem cell collection that was planned for tomorrow. Yesterday, and he develop high grade temperature of 102 and was instructed to go to the emergency department but the patient declined. At the outpatient clinic today, he was noted to be hypotensive with tachycardia and symptoms of dizziness. He denies any dysuria, frequency or urgency. The patient denies any recent signs or symptoms of bleeding such as spontaneous epistaxis, hematuria or hematochezia. The patient had some mild nausea and felt dehydrated. He denies any cough. MEDICAL HISTORY:   Past Medical History   Diagnosis  Date   .  Hypertension     .  Plasmacytoma          Right Ethmoid Sinus; bone marrow biopsy on 03/10/13 showed normal myeloma FISH panel and cytogenetics.    .  Hyperlipidemia         diet control    .  AVN (avascular necrosis of bone)         bilateral hips; s/p hip replacement in 1991 and 2009   .  Headache(784.0)  02/08/2012       Right Sided Headache Behind Right Eye   .  Epistaxis  02/08/2012   .  Blurred vision         Present for "many years"   .  Fatigue     .  Acid reflux     .  History of radiation therapy  02/29/12-04/08/12       right  ethmoid sinus in 50.4Gy in 20 fxs   .  Fatigue  05/25/12       "Mild"   .  Thrombocytopenia  05/25/12   .  Extramedullary  plasmacytoma in relapse  04/10/2013   .  Bone metastases  03/31/13        MR Abdomen -Lower Thoracic and Upper Lumbar spine   .  Liver lesion  03/31/13       MR Abdomen   .  Pancreatic lesion  03/31/13        MR Abdomen - Ucinate Process   .  On antineoplastic chemotherapy  started 04/08/13       Revlimid/ Velcade/ Dex   .  S/P radiation therapy  03/28/2013       Left posterior 7th Rib / 8Gy in 1 fraction   .  Multiple myeloma, in relapse  07/05/2013   .  Anxiety  07/05/2013   .  Nausea alone  09/12/2013   .  Neutropenic fever  09/18/2013    SURGICAL HISTORY: Past Surgical History   Procedure  Laterality  Date   .  Bilateral hip replacement    1991; and 2009       due to bilateral hip AVN's.    .  Mass biopsy    01/26/12       Right Ethmoid Mass - Plasma Cell Neoplasm   .  Eus  N/A  03/24/2013       Procedure: UPPER ENDOSCOPIC ULTRASOUND (EUS) LINEAR;  Surgeon: Theda Belfast, MD;  Location: [facility];  Service: Endoscopy;  Laterality: N/A;   .  Fine needle aspiration  N/A  03/24/2013       Procedure: FINE NEEDLE ASPIRATION (FNA) LINEAR;  Surgeon: Theda Belfast, MD;  Location: [facility];  Service: Endoscopy;  Laterality: N/A;    SOCIAL HISTORY: History   Social History   .  Marital Status:  Married       Spouse Name:  N/A       Number of Children:  2   .  Years of Education:  N/A       Occupational History   .  owner         owns a dump truck company       Social History Main Topics   .  Smoking status:  Never Smoker    .  Smokeless tobacco:  Never Used   .  Alcohol Use:  No         Comment: Hx of Alcohol Abuse. Reported No further intake on 05/25/12   .  Drug Use:  No   .  Sexual Activity:  Yes       Birth Control/ Protection:  None       Other Topics  Concern   .  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  Adopted: Yes    ALLERGIES:  has No  Known Allergies.  MEDICATIONS:   Current Outpatient Prescriptions   Medication  Sig  Dispense  Refill   .  acyclovir (ZOVIRAX) 400 MG tablet  Take 1 tablet (400 mg total) by mouth 2 (two) times daily.   60 tablet   3   .  ciprofloxacin (CIPRO) 500 MG tablet  Take 500 mg by mouth 2 (two) times daily.         .  cycloSPORINE (RESTASIS) 0.05 % ophthalmic emulsion  Place 1 drop into both eyes 2 (two) times daily.         Marland Kitchen  escitalopram (LEXAPRO) 20 MG tablet  Take 10 mg by mouth daily.         .  filgrastim (NEUPOGEN) 300 MCG/0.5ML SOLN injection  Inject 300 mcg into the skin once. 1200 daily         .  lansoprazole (PREVACID) 15 MG capsule  Take 15 mg by mouth daily.         Marland Kitchen  LORazepam (ATIVAN) 1 MG tablet  Take 1 mg by mouth every 8 (eight) hours as needed (nausea).         .  olmesartan (BENICAR) 20 MG tablet  Take 20 mg by mouth every morning.          .  ondansetron (ZOFRAN) 8 MG tablet  Take 1 tablet (8 mg total) by mouth every 8 (eight) hours as needed for nausea.   20 tablet   2   .  oxycodone (OXY-IR) 5 MG capsule  Take 5 mg by mouth every 4 (four) hours as needed for pain.         .  potassium chloride (K-DUR,KLOR-CON) 10 MEQ tablet  Take 10 mEq by mouth daily.         Marland Kitchen  prochlorperazine (COMPAZINE) 10 MG tablet  Take 1 tablet (10 mg total) by mouth every 6 (six) hours as needed (Nausea or vomiting).   30 tablet   1   .  triamcinolone (NASACORT) 55 MCG/ACT nasal inhaler  Place 2 sprays into the nose daily.            REVIEW OF SYSTEMS:    Eyes: Denies blurriness of vision, double vision or watery eyes Ears, nose, mouth, throat, and face: Denies mucositis. He complained of mild sore throat Respiratory: Denies cough, dyspnea or wheezes Cardiovascular: Denies palpitation, chest discomfort or lower extremity swelling Gastrointestinal:  Denies nausea, heartburn or change in bowel habits Skin: His skin rash on his face and back is improving Lymphatics: Denies new lymphadenopathy or easy  bruising Neurological:Denies numbness, tingling or new weaknesses Behavioral/Psych: Mood is stable, no new changes   All other systems were reviewed with the patient and are negative.  PHYSICAL EXAMINATION: ECOG PERFORMANCE STATUS: 1 - Symptomatic but completely ambulatory Blood pressure 91/62 Heart rate 103 Temperature 99 Respiration rate 20 GENERAL:alert, no distress and comfortable SKIN: He has significant skin rash on his face and his back but it is improving compared to last week visit   EYES: normal, conjunctiva are pink and non-injected, sclera clear OROPHARYNX:no exudate, no erythema and lips, buccal mucosa, and tongue normal   NECK: supple, thyroid normal size, non-tender, without nodularity LYMPH:  no palpable lymphadenopathy in the cervical, axillary or inguinal LUNGS: clear to auscultation and percussion with normal breathing effort HEART: regular rate & rhythm and no murmurs and no lower extremity edema. Noted tachycardia ABDOMEN:abdomen soft, non-tender and normal bowel sounds Musculoskeletal:no cyanosis of digits and no clubbing   PSYCH: alert & oriented x 3 with fluent speech NEURO: no focal motor/sensory deficits  LABORATORY DATA:  I have reviewed the data as listed.   Lab Results   Component  Value  Date     WBC  0.5*  09/18/2013     HGB  9.5*  09/18/2013     HCT  26.3*  09/18/2013     MCV  89.2  09/18/2013     PLT  19*  09/18/2013    ASSESSMENT:  Neutropenic fever   PLAN:   #1 neutropenic fever I have drawn blood culture at the clinic. We will order a urine culture and chest x-ray. I will consult inpatient pharmacy for dosing for intravenous vancomycin and intravenous Zosyn. I will Continue on oral valacyclovir. We will draw blood work every day until neutropenia resolves. I will start him on Neupogen 480 mcg daily subcutaneous #2 multiple myeloma He has achieved very good partial response. I left a message to the bone marrow transplant center at [facility] to hold his stem cell collection due to neutropenic fever #3 skin rash Cause is unclear but likely be due to folliculitis. Hopefully the broad-spectrum intravenous antibiotics will help with that #4 hypotension #5 tachycardia I am giving him IV fluid resuscitation at the clinic, pending a hospital bed. Once he gets a bit in the hospital, we will continue IV fluids resuscitation. #6 anemia This is likely anemia of chronic disease. The patient denies recent history of bleeding such as epistaxis, hematuria or hematochezia. He is asymptomatic from the anemia. We will observe for now.  He does not require transfusion now.   #7 severe thrombocytopenia This is likely due to recent treatment. The patient denies recent history of bleeding such as epistaxis, hematuria or hematochezia. He is asymptomatic from  the low platelet count. I will observe for now.  he does not require transfusion now. If he starts to have bleeding or platelet count less than 10,000, we will proceed with platelet transfusion   At present time, the patient is deemed safe to be treated in the regular hospital bed. However if his condition get worse, he may need ICU intervention. The risk of not undergoing him would be risk of death All questions were answered. The patient knows to call the clinic with any problems, questions or concerns.    Anesha Hackert, MD 09/18/2013 2:50 PM

## 2013-09-18 NOTE — Progress Notes (Signed)
Pt taken to Room #1305 via w/c accompanied by his son.  Report given by phone to Toni Amend, RN and brief bedside report to [place], Charity fundraiser.  Pt alert and oriented, no acute distress.

## 2013-09-18 NOTE — Telephone Encounter (Signed)
CBC results faxed to [facility]

## 2013-09-18 NOTE — Progress Notes (Signed)
1420-3 sets of blood cultures drawn per order of Dr. Bertis Ruddy.  One set from each hickman lumen and one peripherally.  Pt awaiting admission to hospital.  Pt has no questions at this time.

## 2013-09-18 NOTE — Progress Notes (Signed)
100 mL of amber colored urine noted in the patient's bedside urinal. Patient states he has been urinating all day and has not noticed the urine to be that dark. Dr. Bertis Ruddy notified.

## 2013-09-18 NOTE — Progress Notes (Signed)
ANTIBIOTIC CONSULT NOTE - INITIAL  Pharmacy Consult for Vancomycin/Zosyn Indication: Febrile neutropenia   No Known Allergies  Patient Measurements:   Vital Signs: Temp: 98.5 F (36.9 C) (12/08 1403) Temp src: Oral (12/08 1403) BP: 80/54 mmHg (12/08 1403) Pulse Rate: 103 (12/08 1301)  Intake/Output from previous day:   Intake/Output from this shift:    Labs:  Recent Labs  09/18/13 1223 09/18/13 1223  WBC 0.5*  --   HGB 9.5*  --   PLT 19*  --   CREATININE  --  0.9   The CrCl is unknown because both a height and weight (above a minimum accepted value) are required for this calculation. No results found for this basename: VANCOTROUGH, Leodis Binet, VANCORANDOM, GENTTROUGH, GENTPEAK, GENTRANDOM, TOBRATROUGH, TOBRAPEAK, TOBRARND, AMIKACINPEAK, AMIKACINTROU, AMIKACIN,  in the last 72 hours   Microbiology: Recent Results (from the past 720 hour(s))  TECHNOLOGIST REVIEW     Status: None   Collection Time    09/15/13  1:11 PM      Result Value Range Status   Technologist Review Occ lymph   Final    Medical History: Past Medical History  Diagnosis Date  . Hypertension   . Plasmacytoma      Right Ethmoid Sinus; bone marrow biopsy on 03/10/13 showed normal myeloma FISH panel and cytogenetics.   . Hyperlipidemia     diet control   . AVN (avascular necrosis of bone)     bilateral hips; s/p hip replacement in 1991 and 2009  . Headache(784.0) 02/08/2012    Right Sided Headache Behind Right Eye  . Epistaxis 02/08/2012  . Blurred vision     Present for "many years"  . Fatigue   . Acid reflux   . History of radiation therapy 02/29/12-04/08/12    right  ethmoid sinus in 50.4Gy in 20 fxs  . Fatigue 05/25/12    "Mild"  . Thrombocytopenia 05/25/12  . 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 Bone metastases 03/31/13     MR Abdomen -Lower Thoracic and Upper Lumbar spine  . Liver lesion 03/31/13    MR Abdomen  . Pancreatic lesion 03/31/13     MR Abdomen - Ucinate Process   . On antineoplastic chemotherapy started 04/08/13    Revlimid/ Velcade/ Dex  . S/P radiation therapy 03/28/2013    Left posterior 7th Rib / 8Gy in 1 fraction  . Multiple myeloma, in relapse 07/05/2013  . Anxiety 07/05/2013  . Nausea alone 09/12/2013  . Neutropenic fever 09/18/2013    Medications:  Scheduled:  . filgrastim  480 mcg Subcutaneous Once  . midazolam  10 mg Intravenous Once   Infusions:  . sodium chloride    . sodium chloride     PRN: acetaminophen, alum & mag hydroxide-simeth, morphine, senna-docusate, zolpidem Assessment:  52 yo M with multiple myeloma, admitted for febrile neutropenia, hypotension, tachycardia, possible folliculi its, Starting vancomycin/zosyn per pharmacy   Scr is 0.9 with estimated CrCl > 100 ml/min  Patients weight 122.5 kg  3 sets of blood cultures were drawn at [facility] - One set from each hickman lumen and one peripherally  Goal of Therapy:  Vancomycin trough level 15-20 mcg/ml  Plan:  1.) Vancomycin 2500 mg IV x 1 now 2.) Start vancomycin 1250 mg IV q12h 3.) Zosyn 3.375 grams IV q8h 4.) Monitor renal function, vancomycin troughs as needed 5.) f/u cultures from outpatient clinic.   Oshen Wlodarczyk, Loma Messing 09/18/2013,6:21 PM

## 2013-09-18 NOTE — Progress Notes (Signed)
Patient is being admitted This encounter was created in error - please disregard.

## 2013-09-19 DIAGNOSIS — M549 Dorsalgia, unspecified: Secondary | ICD-10-CM

## 2013-09-19 DIAGNOSIS — R110 Nausea: Secondary | ICD-10-CM

## 2013-09-19 LAB — COMPREHENSIVE METABOLIC PANEL
ALT: 21 U/L (ref 0–53)
Albumin: 2.7 g/dL — ABNORMAL LOW (ref 3.5–5.2)
BUN: 7 mg/dL (ref 6–23)
Calcium: 8.2 mg/dL — ABNORMAL LOW (ref 8.4–10.5)
Chloride: 106 mEq/L (ref 96–112)
Creatinine, Ser: 0.87 mg/dL (ref 0.50–1.35)
GFR calc non Af Amer: >90 mL/min (ref >90)
Potassium: 3.4 mEq/L — ABNORMAL LOW (ref 3.5–5.1)
Total Bilirubin: 1.2 mg/dL (ref 0.3–1.2)
Total Protein: 5.1 g/dL — ABNORMAL LOW (ref 6.0–8.3)

## 2013-09-19 LAB — PREPARE RBC (CROSSMATCH)

## 2013-09-19 LAB — URINALYSIS, ROUTINE W REFLEX MICROSCOPIC
Bilirubin Urine: NEGATIVE
Ketones, ur: NEGATIVE mg/dL
Specific Gravity, Urine: 1.023 (ref 1.005–1.030)
pH: 5.5 (ref 5.0–8.0)

## 2013-09-19 LAB — ABO/RH: ABO/RH(D): A POS

## 2013-09-19 LAB — CBC WITH DIFFERENTIAL/PLATELET
Basophils Absolute: 0 10*3/uL (ref 0.0–0.1)
Eosinophils Absolute: 0 10*3/uL (ref 0.0–0.7)
HCT: 21.4 % — ABNORMAL LOW (ref 39.0–52.0)
Lymphs Abs: 0.4 10*3/uL — ABNORMAL LOW (ref 0.7–4.0)
MCH: 32.6 pg (ref 26.0–34.0)
MCHC: 36.9 g/dL — ABNORMAL HIGH (ref 30.0–36.0)
MCV: 88.4 fL (ref 78.0–100.0)
Monocytes Absolute: 0.1 10*3/uL (ref 0.1–1.0)
Monocytes Relative: 11 % (ref 3–12)
Neutro Abs: 0.8 10*3/uL — ABNORMAL LOW (ref 1.7–7.7)
Platelets: 18 10*3/uL — CL (ref 150–400)
RDW: 12 % (ref 11.5–15.5)
WBC: 1.3 10*3/uL — CL (ref 4.0–10.5)

## 2013-09-19 LAB — URINE MICROSCOPIC-ADD ON

## 2013-09-19 LAB — URINE CULTURE
Colony Count: NO GROWTH
Culture: NO GROWTH

## 2013-09-19 MED ORDER — FILGRASTIM 480 MCG/1.6ML IJ SOLN
1200.0000 ug | Freq: Every day | INTRAMUSCULAR | Status: DC
  Administered 2013-09-19 – 2013-09-20 (×2): 1200 ug via SUBCUTANEOUS
  Filled 2013-09-19 (×3): qty 4.8

## 2013-09-19 MED ORDER — POTASSIUM CHLORIDE 10 MEQ/50ML IV SOLN
10.0000 meq | INTRAVENOUS | Status: AC
  Administered 2013-09-19 (×2): 10 meq via INTRAVENOUS
  Filled 2013-09-19 (×2): qty 50

## 2013-09-19 MED ORDER — SODIUM CHLORIDE 0.9 % IV SOLN
8.0000 mg | Freq: Three times a day (TID) | INTRAVENOUS | Status: DC | PRN
  Administered 2013-09-19: 8 mg via INTRAVENOUS
  Filled 2013-09-19: qty 4

## 2013-09-19 NOTE — Progress Notes (Signed)
Austin Griffith   DOB:Aug 30, 1961   UJ#:[id number]    Subjective: The patient felt a bit better. He still has some headaches and dizziness when he gets up. Denies any fevers or chills. He complained of some mild nausea but no vomiting. No diarrhea. His skin rash is mildly improving. He continued to experience some intermittent back pain.  Objective:  Filed Vitals:   09/19/13 0500  BP: 125/75  Pulse: 90  Temp: 98.7 F (37.1 C)  Resp: 20     Intake/Output Summary (Last 24 hours) at 09/19/13 9562 Last data filed at 09/19/13 1308  Gross per 24 hour  Intake 1727.5 ml  Output   1025 ml  Net  702.5 ml    GENERAL:alert, no distress and comfortable SKIN: The skin rash on his face is mildly improving EYES: normal, Conjunctiva are pink and non-injected, sclera clear OROPHARYNX:no exudate, no erythema and lips, buccal mucosa, and tongue normal  NECK: supple, thyroid normal size, non-tender, without nodularity LYMPH:  no palpable lymphadenopathy in the cervical, axillary or inguinal LUNGS: clear to auscultation and percussion with normal breathing effort HEART: regular rate & rhythm and no murmurs and no lower extremity edema ABDOMEN:abdomen soft, non-tender and normal bowel sounds Musculoskeletal:no cyanosis of digits and no clubbing  NEURO: alert & oriented x 3 with fluent speech, no focal motor/sensory deficits   Labs:  Lab Results  Component Value Date   WBC 1.3* 09/19/2013   HGB 7.9* 09/19/2013   HCT 21.4* 09/19/2013   MCV 88.4 09/19/2013   PLT 18* 09/19/2013   NEUTROABS 0.8* 09/19/2013   His potassium level and calcium level is low Studies: I reviewed the chest x-ray and agree with the interpretation X-ray Chest Pa And Lateral   09/18/2013   CLINICAL DATA:  Neutropenic fever. Rule out pneumonia. History of multiple myeloma. Evaluate Port-A-Cath.  EXAM: CHEST  2 VIEW  COMPARISON:  03/03/2013  FINDINGS: Patient has right-sided dual-lumen catheter, tip overlying the level of the superior  vena cava. The heart size is normal. There is mild perihilar peribronchial thickening. There is not expansile lesion in the left 7th rib, previously biopsied. No focal consolidations or pleural effusions identified. No pneumothorax or edema.  IMPRESSION: 1.  No evidence for acute cardiopulmonary abnormality. 2. Stable left rib lesion. 3. Right-sided central venous line to the superior vena cava.   Electronically Signed   By: Rosalie Gums M.D.   On: 09/18/2013 21:51    Assessment: Neutropenic fever  Plan:  #1 neutropenic fever At [facility], I am increasing the Neupogen dose to 1200 mcg. Blood culture is pending. Urinalysis shows some mild bacteria in the urine. Chest x-ray is normal. I recommend we continue current broad-spectrum intravenous antibiotics for another 48 hours minimum until his neutropenia resolved #2 anemia We discussed some of the risks, benefits, and alternatives of blood transfusions. The patient is symptomatic from anemia and the hemoglobin level is critically low.  Some of the side-effects to be expected including risks of transfusion reactions, chills, infection, syndrome of volume overload and risk of hospitalization from various reasons and the patient is willing to proceed and went ahead to sign consent today. I recommend 2 units of blood transfusion. #3 thrombocytopenia This is likely due to recent treatment. The patient denies recent history of bleeding such as epistaxis, hematuria or hematochezia. He is asymptomatic from the low platelet count. I will observe for now.  he does not require transfusion now. #4 nausea Continue antiemetics as needed. Continue IV fluids. #  5 multiple myeloma I spoke with transplant coordinator from [facility]. We'll continue aggressive supportive care. Once his white count improved, they will see him back for some cell collection. #6 back pain We'll continue oxycodone as needed   [facility], Maryhelen Lindler, MD 09/19/2013  8:37 AM

## 2013-09-19 NOTE — Care Management Note (Signed)
   CARE MANAGEMENT NOTE 09/19/2013  Patient:  Austin Griffith, Austin Griffith   Account Number:  [id number]  Date Initiated:  09/19/2013  Documentation initiated by:  Bensen Chadderdon  Subjective/Objective Assessment:   52 yo male admitted with neutropenia. PTA pt independent     Action/Plan:   Home when stable   Anticipated DC Date:     Anticipated DC Plan:  HOME/SELF CARE      DC Planning Services  CM consult      Choice offered to / List presented to:  NA   DME arranged  NA      DME agency  NA     HH arranged  NA      HH agency  NA   Status of service:  In process, will continue to follow Medicare Important Message given?   (If response is "NO", the following Medicare IM given date fields will be blank) Date Medicare IM given:   Date Additional Medicare IM given:    Discharge Disposition:    Per UR Regulation:  Reviewed for med. necessity/level of care/duration of stay  If discussed at Long Length of Stay Meetings, dates discussed:    Comments:  09/19/13 1244 Amaira Safley,RN,MSN [phone] Chart reviewed for utilization of services. No needs identified at this time.

## 2013-09-19 NOTE — Progress Notes (Signed)
Offered support and discussed services of chaplains. Gave the patient a prayer shawl. Presence; listening.

## 2013-09-19 NOTE — Progress Notes (Signed)
CRITICAL VALUE ALERT  Critical value received:  WBC 1.3, Platelets 18  Date of notification:  09/19/13  Time of notification:  0615  Critical value read back:yes  Nurse who received alert:  Kenton Kingfisher RN  MD not notified, WBC is improved from 0.5 yesterday; Platelets were 19 yesterday, stable. Eugene Garnet RN

## 2013-09-20 LAB — CBC WITH DIFFERENTIAL/PLATELET
Basophils Absolute: 0.1 10*3/uL (ref 0.0–0.1)
Lymphocytes Relative: 6 % — ABNORMAL LOW (ref 12–46)
Lymphs Abs: 0.3 10*3/uL — ABNORMAL LOW (ref 0.7–4.0)
MCH: 31.6 pg (ref 26.0–34.0)
MCV: 85.4 fL (ref 78.0–100.0)
Monocytes Absolute: 1.2 10*3/uL — ABNORMAL HIGH (ref 0.1–1.0)
Monocytes Relative: 23 % — ABNORMAL HIGH (ref 3–12)
Platelets: 23 10*3/uL — CL (ref 150–400)
RDW: 14.1 % (ref 11.5–15.5)
WBC: 5 10*3/uL (ref 4.0–10.5)

## 2013-09-20 LAB — BASIC METABOLIC PANEL
BUN: 4 mg/dL — ABNORMAL LOW (ref 6–23)
Creatinine, Ser: 0.93 mg/dL (ref 0.50–1.35)
GFR calc Af Amer: >90 mL/min (ref >90)
GFR calc non Af Amer: >90 mL/min (ref >90)
Potassium: 3.5 mEq/L (ref 3.5–5.1)
Sodium: 137 mEq/L (ref 135–145)

## 2013-09-20 LAB — TYPE AND SCREEN
Antibody Screen: NEGATIVE
Unit division: 0
Unit division: 0

## 2013-09-20 LAB — URINE CULTURE: Colony Count: NO GROWTH

## 2013-09-20 MED ORDER — LORAZEPAM 1 MG PO TABS
1.0000 mg | ORAL_TABLET | Freq: Three times a day (TID) | ORAL | Status: DC | PRN
  Administered 2013-09-20 – 2013-09-21 (×2): 1 mg via ORAL
  Filled 2013-09-20 (×2): qty 1

## 2013-09-20 MED ORDER — IRBESARTAN 75 MG PO TABS
75.0000 mg | ORAL_TABLET | Freq: Once | ORAL | Status: AC
  Administered 2013-09-20: 75 mg via ORAL
  Filled 2013-09-20: qty 1

## 2013-09-20 NOTE — Progress Notes (Signed)
Austin Griffith   DOB:10/01/1961   NW#:[id number]    Subjective: He is feeling better. Headache has resolved. He received 2 units of blood transfusions without difficulties. Nausea has resolved. His pain appeared to be under control with morphine when necessary.  Objective:  Filed Vitals:   09/20/13 1530  BP: 154/88  Pulse:   Temp: 99.5 F (37.5 C)  Resp:      Intake/Output Summary (Last 24 hours) at 09/20/13 1701 Last data filed at 09/20/13 1400  Gross per 24 hour  Intake   2370 ml  Output   3500 ml  Net  -1130 ml    GENERAL:alert, no distress and comfortable SKIN: skin color, texture, turgor are normal, no rashes or significant lesions EYES: normal, Conjunctiva are pink and non-injected, sclera clear OROPHARYNX:no exudate, no erythema and lips, buccal mucosa, and tongue normal  NECK: supple, thyroid normal size, non-tender, without nodularity LYMPH:  no palpable lymphadenopathy in the cervical, axillary or inguinal LUNGS: clear to auscultation and percussion with normal breathing effort HEART: regular rate & rhythm and no murmurs and no lower extremity edema ABDOMEN:abdomen soft, non-tender and normal bowel sounds Musculoskeletal:no cyanosis of digits and no clubbing  NEURO: alert & oriented x 3 with fluent speech, no focal motor/sensory deficits   Labs:  Lab Results  Component Value Date   WBC 5.0 09/20/2013   HGB 10.2* 09/20/2013   HCT 27.6* 09/20/2013   MCV 85.4 09/20/2013   PLT 23* 09/20/2013   NEUTROABS 3.4 09/20/2013   Studies:  X-ray Chest Pa And Lateral   09/18/2013   CLINICAL DATA:  Neutropenic fever. Rule out pneumonia. History of multiple myeloma. Evaluate Port-A-Cath.  EXAM: CHEST  2 VIEW  COMPARISON:  03/03/2013  FINDINGS: Patient has right-sided dual-lumen catheter, tip overlying the level of the superior vena cava. The heart size is normal. There is mild perihilar peribronchial thickening. There is not expansile lesion in the left 7th rib, previously  biopsied. No focal consolidations or pleural effusions identified. No pneumothorax or edema.  IMPRESSION: 1.  No evidence for acute cardiopulmonary abnormality. 2. Stable left rib lesion. 3. Right-sided central venous line to the superior vena cava.   Electronically Signed   By: Rosalie Gums M.D.   On: 09/18/2013 21:51    Assessment: Recent neutropenic fever, resolved  Plan:  #1 neutropenic fever At [facility], I am increasing the Neupogen dose to 1200 mcg. Blood culture is pending. Urinalysis shows some mild bacteria in the urine culture was negative. Chest x-ray is normal. I recommend we continue current broad-spectrum intravenous antibiotics for another day. Per discussion with [facility], we will continue Neupogen one more day #2 anemia This is likely anemia of chronic disease and recent treatment. The patient denies recent history of bleeding such as epistaxis, hematuria or hematochezia. He is asymptomatic from the anemia. We will observe for now.  He received blood transfusion yesterday and is much improved #3 thrombocytopenia This is likely due to recent treatment. The patient denies recent history of bleeding such as epistaxis, hematuria or hematochezia. He is asymptomatic from the low platelet count. I will observe for now.  he does not require transfusion now. #4 nausea Continue antiemetics as needed. Will discontinue IV fluids #5 multiple myeloma I spoke with transplant coordinator from [facility]. We'll continue aggressive supportive care. Once his white count improved, they will see him back for some cell collection. #6 back pain We'll continue oxycodone as needed #7 DVT prophylaxis Lovenox is contraindicated due to  severe thrombocytopenia Agustus Mane, MD 09/20/2013  5:01 PM

## 2013-09-21 LAB — CBC WITH DIFFERENTIAL/PLATELET
Basophils Absolute: 0.1 10*3/uL (ref 0.0–0.1)
Basophils Relative: 1 % (ref 0–1)
Eosinophils Absolute: 0 10*3/uL (ref 0.0–0.7)
Hemoglobin: 9.8 g/dL — ABNORMAL LOW (ref 13.0–17.0)
Lymphocytes Relative: 6 % — ABNORMAL LOW (ref 12–46)
Lymphs Abs: 0.7 10*3/uL (ref 0.7–4.0)
MCHC: 37 g/dL — ABNORMAL HIGH (ref 30.0–36.0)
Monocytes Absolute: 1.9 10*3/uL — ABNORMAL HIGH (ref 0.1–1.0)
Monocytes Relative: 17 % — ABNORMAL HIGH (ref 3–12)
Neutrophils Relative %: 76 % (ref 43–77)
Platelets: 41 10*3/uL — ABNORMAL LOW (ref 150–400)
RDW: 14.4 % (ref 11.5–15.5)

## 2013-09-21 NOTE — Discharge Summary (Signed)
Physician Discharge Summary  Patient ID: Austin Griffith MRN: [id number] DOB/AGE: 1961-05-30 52 y.o.  Admit date: 09/18/2013 Discharge date: 09/21/2013  Primary Care Physician:  Colette Ribas, MD   Discharge Diagnoses:  Neutropenic fever, resolved  Present on Admission:  . Neutropenic fever  Discharge Medications:    Medication List    STOP taking these medications       NEUPOGEN 300 MCG/0.5ML Soln injection  Generic drug:  filgrastim      TAKE these medications       acyclovir 400 MG tablet  Commonly known as:  ZOVIRAX  Take 1 tablet (400 mg total) by mouth 2 (two) times daily.     ciprofloxacin 500 MG tablet  Commonly known as:  CIPRO  Take 500 mg by mouth 2 (two) times daily.     cycloSPORINE 0.05 % ophthalmic emulsion  Commonly known as:  RESTASIS  Place 1 drop into both eyes 2 (two) times daily.     escitalopram 20 MG tablet  Commonly known as:  LEXAPRO  Take 10 mg by mouth daily.     lansoprazole 15 MG capsule  Commonly known as:  PREVACID  Take 15 mg by mouth daily.     LORazepam 1 MG tablet  Commonly known as:  ATIVAN  Take 1 mg by mouth every 8 (eight) hours as needed (nausea).     olmesartan 20 MG tablet  Commonly known as:  BENICAR  Take 20 mg by mouth every morning.      ASK your doctor about these medications       ondansetron 8 MG tablet  Commonly known as:  ZOFRAN  Take 1 tablet (8 mg total) by mouth every 8 (eight) hours as needed for nausea.     oxycodone 5 MG capsule  Commonly known as:  OXY-IR  Take 5 mg by mouth every 4 (four) hours as needed for pain.     potassium chloride 10 MEQ tablet  Commonly known as:  K-DUR,KLOR-CON  Take 10 mEq by mouth daily.     prochlorperazine 10 MG tablet  Commonly known as:  COMPAZINE  Take 1 tablet (10 mg total) by mouth every 6 (six) hours as needed (Nausea or vomiting).     triamcinolone 55 MCG/ACT nasal inhaler  Commonly known as:  NASACORT  Place 2 sprays into the nose  daily.         Disposition and Follow-up: Patient will return to [facility] for bone marrow transplant  Significant Diagnostic Studies:  X-ray Chest Pa And Lateral   09/18/2013   CLINICAL DATA:  Neutropenic fever. Rule out pneumonia. History of multiple myeloma. Evaluate Port-A-Cath.  EXAM: CHEST  2 VIEW  COMPARISON:  03/03/2013  FINDINGS: Patient has right-sided dual-lumen catheter, tip overlying the level of the superior vena cava. The heart size is normal. There is mild perihilar peribronchial thickening. There is not expansile lesion in the left 7th rib, previously biopsied. No focal consolidations or pleural effusions identified. No pneumothorax or edema.  IMPRESSION: 1.  No evidence for acute cardiopulmonary abnormality. 2. Stable left rib lesion. 3. Right-sided central venous line to the superior vena cava.   Electronically Signed   By: Rosalie Gums M.D.   On: 09/18/2013 21:51    Discharge Laboratory Values: Lab Results  Component Value Date   WBC 11.1* 09/21/2013   HGB 9.8* 09/21/2013   HCT 26.5* 09/21/2013   MCV 86.3 09/21/2013   PLT 41* 09/21/2013   Brief hospital course:  For complete details please refer to admission H and P, but in brief, Mr. Widmer developed neutropenic fever following recent chemotherapy. Blood culture, urine culture and chest x-ray were negative. The patient received broad-spectrum intravenous antibiotics with IV vancomycin and Zosyn as well as Neupogen support. On 09/19/2013, due to severe, symptomatic anemia, he received 2 units of blood transfusion with resolution of his headaches. The patient will resume his prophylactic antibiotics with oral ciprofloxacin and oral acyclovir. Physical Exam at Discharge: BP 138/76  Pulse 96  Temp(Src) 98.4 F (36.9 C) (Oral)  Resp 20  Ht 6\' 3"  (1.905 m)  Wt 270 lb (122.471 kg)  BMI 33.75 kg/m2  SpO2 94% Gen: Alert, oriented x3, no distress and appeared comfortable Cardiovascular: Regular, rate and rhythm. No  murmurs Respiratory: Clear on auscultation bilaterally, no wheezes with normal effort Gastrointestinal: Soft, nontender with normal bowel sounds Extremities: No bilateral lower extremity edema.  Diet:  Regular  Activity:  As tolerated  Condition at Discharge:   Stable  Signed: Dr. Artis Delay [phone] Spent 35 minutes on discharge 09/21/2013, 8:46 AM

## 2013-09-21 NOTE — Progress Notes (Signed)
ANTIBIOTIC CONSULT NOTE - FOLLOW UP  Pharmacy Consult for Vancomycin/Zosyn Indication: Febrile neutropenia   No Known Allergies  Patient Measurements: Height: 6\' 3"  (190.5 cm) Weight: 270 lb (122.471 kg) IBW/kg (Calculated) : 84.5   Vital Signs: Temp: 98.4 F (36.9 C) (12/11 0557) Temp src: Oral (12/11 0557) BP: 138/76 mmHg (12/11 0557) Pulse Rate: 96 (12/11 0557) Intake/Output from previous day: 12/10 0701 - 12/11 0700 In: 240 [P.O.:240] Out: 3075 [Urine:3075] Intake/Output from this shift:    Labs:  Recent Labs  09/18/13 1223 09/18/13 1223 09/19/13 0438 09/20/13 0458  WBC 0.5*  --  1.3* 5.0  HGB 9.5*  --  7.9* 10.2*  PLT 19*  --  18* 23*  CREATININE  --  0.9 0.87 0.93   Estimated Creatinine Clearance: 134 ml/min (by C-G formula based on Cr of 0.93). No results found for this basename: Rolm Gala, VANCORANDOM, GENTTROUGH, GENTPEAK, GENTRANDOM, TOBRATROUGH, TOBRAPEAK, TOBRARND, AMIKACINPEAK, AMIKACINTROU, AMIKACIN,  in the last 72 hours   Microbiology: Recent Results (from the past 720 hour(s))  TECHNOLOGIST REVIEW     Status: None   Collection Time    09/15/13  1:11 PM      Result Value Range Status   Technologist Review Occ lymph   Final  URINE CULTURE     Status: None   Collection Time    09/18/13  6:34 PM      Result Value Range Status   Specimen Description URINE, CLEAN CATCH   Final   Special Requests Immunocompromised   Final   Culture  Setup Time     Final   Value: 09/18/2013 21:49     Performed at [facility] Count     Final   Value: NO GROWTH     Performed at [facility]   Culture     Final   Value: NO GROWTH     Performed at [facility]   Report Status 09/19/2013 FINAL   Final  URINE CULTURE     Status: None   Collection Time    09/19/13 12:04 AM      Result Value Range Status   Specimen Description URINE, CLEAN CATCH   Final   Special Requests NONE   Final   Culture  Setup Time     Final    Value: 09/19/2013 08:09     Performed at [facility] Count     Final   Value: NO GROWTH     Performed at [facility]   Culture     Final   Value: NO GROWTH     Performed at [facility]   Report Status 09/20/2013 FINAL   Final    Anti-infectives   Start     Dose/Rate Route Frequency Ordered Stop   09/19/13 0800  vancomycin (VANCOCIN) 1,250 mg in sodium chloride 0.9 % 250 mL IVPB     1,250 mg 166.7 mL/hr over 90 Minutes Intravenous Every 12 hours 09/18/13 1841     09/18/13 2200  valACYclovir (VALTREX) tablet 500 mg     500 mg Oral 2 times daily 09/18/13 1951     09/18/13 2000  piperacillin-tazobactam (ZOSYN) IVPB 3.375 g     3.375 g 12.5 mL/hr over 240 Minutes Intravenous 3 times per day 09/18/13 1841     09/18/13 1930  vancomycin (VANCOCIN) 2,500 mg in sodium chloride 0.9 % 500 mL IVPB     2,500 mg 250 mL/hr over  120 Minutes Intravenous  Once 09/18/13 1841 09/18/13 2235      Assessment:  52 yo M on D#4 Vancomycin 1250 mg IV q12h/Zosyn 3.375 grams IV q8h for febrile neutropenia, possible UTI, and possible folliculitis.  No labs today but patient's renal function has remained WNL and stable with CrCl > 100 ml/min.    Remains AF  ANC up to 3.4 (0.2 on admission) After 3 doses of filgrastim (1 dose of 480 mcg on 12/8; 1200 mcg on 12/9 and 12/10) - Anticipate today is the last day of high dose neupogen per MD note/[facility] protocol - will f/u that stop date entered for appropriate stop date, will f/u with MD  Urine cultures from 12/8 and 12/9 are NG Final; Blood cultures x 3 drawn prior to admission are negative to date per MD note.    Will hold off on obtaining vancomycin trough level as patient is improving, renal function is stable, and anticipate today is the patients last day of broad spectrum abx based on Dr. Maxine Glenn note.  If vancomycin continues beyond today will consider vancomycin trough to ensure therapeutic   Goal of Therapy:   Vancomycin trough level 15-20 mcg/ml  Plan:  1.) Continue Vancomycin 1250 mg IV q12h 2.) Continue Zosyn 3.375 grams IV q8h, infuse over 4 hours 3.) Continue Neupogen 1200 mcg today  4.) F/u MD Stop dates entered for antibiotics and neupogen per MD - Thanks!   BorgerdingLoma Messing PharmD Pager #: [phone] 7:28 AM 09/21/2013

## 2013-09-24 LAB — CULTURE, BLOOD (SINGLE)

## 2013-09-29 DIAGNOSIS — Z9481 Bone marrow transplant status: Secondary | ICD-10-CM | POA: Insufficient documentation

## 2013-10-16 ENCOUNTER — Other Ambulatory Visit: Payer: Self-pay | Admitting: *Deleted

## 2013-10-16 ENCOUNTER — Other Ambulatory Visit: Payer: Self-pay | Admitting: Hematology and Oncology

## 2013-10-16 ENCOUNTER — Telehealth: Payer: Self-pay | Admitting: Hematology and Oncology

## 2013-10-16 DIAGNOSIS — R5383 Other fatigue: Secondary | ICD-10-CM

## 2013-10-16 DIAGNOSIS — C9002 Multiple myeloma in relapse: Secondary | ICD-10-CM

## 2013-10-16 DIAGNOSIS — R5381 Other malaise: Secondary | ICD-10-CM | POA: Insufficient documentation

## 2013-10-16 NOTE — Telephone Encounter (Signed)
LMONVM FOR PT RE APPT FOR 1/7. ALSO CALLED CELL AND S/W WIFE RE APPT.

## 2013-10-18 ENCOUNTER — Telehealth: Payer: Self-pay | Admitting: Hematology and Oncology

## 2013-10-18 ENCOUNTER — Other Ambulatory Visit (HOSPITAL_BASED_OUTPATIENT_CLINIC_OR_DEPARTMENT_OTHER): Payer: No Typology Code available for payment source

## 2013-10-18 ENCOUNTER — Ambulatory Visit (HOSPITAL_BASED_OUTPATIENT_CLINIC_OR_DEPARTMENT_OTHER): Payer: No Typology Code available for payment source | Admitting: Hematology and Oncology

## 2013-10-18 ENCOUNTER — Encounter: Payer: Self-pay | Admitting: Hematology and Oncology

## 2013-10-18 VITALS — BP 120/73 | HR 102 | Temp 98.0°F | Resp 18 | Ht 75.0 in | Wt 261.9 lb

## 2013-10-18 DIAGNOSIS — C9002 Multiple myeloma in relapse: Secondary | ICD-10-CM

## 2013-10-18 DIAGNOSIS — Z9484 Stem cells transplant status: Secondary | ICD-10-CM

## 2013-10-18 DIAGNOSIS — D696 Thrombocytopenia, unspecified: Secondary | ICD-10-CM

## 2013-10-18 DIAGNOSIS — I10 Essential (primary) hypertension: Secondary | ICD-10-CM

## 2013-10-18 DIAGNOSIS — D72819 Decreased white blood cell count, unspecified: Secondary | ICD-10-CM

## 2013-10-18 DIAGNOSIS — M25569 Pain in unspecified knee: Secondary | ICD-10-CM

## 2013-10-18 DIAGNOSIS — D649 Anemia, unspecified: Secondary | ICD-10-CM

## 2013-10-18 HISTORY — DX: Pain in unspecified knee: M25.569

## 2013-10-18 LAB — CBC WITH DIFFERENTIAL/PLATELET
BASO%: 0.7 % (ref 0.0–2.0)
Basophils Absolute: 0 10*3/uL (ref 0.0–0.1)
EOS%: 0.6 % (ref 0.0–7.0)
Eosinophils Absolute: 0 10*3/uL (ref 0.0–0.5)
HEMATOCRIT: 33.2 % — AB (ref 38.4–49.9)
HGB: 11.4 g/dL — ABNORMAL LOW (ref 13.0–17.1)
LYMPH%: 7.1 % — ABNORMAL LOW (ref 14.0–49.0)
MCH: 30.6 pg (ref 27.2–33.4)
MCHC: 34.3 g/dL (ref 32.0–36.0)
MCV: 89.3 fL (ref 79.3–98.0)
MONO#: 0.9 10*3/uL (ref 0.1–0.9)
MONO%: 27.6 % — ABNORMAL HIGH (ref 0.0–14.0)
NEUT#: 2.1 10*3/uL (ref 1.5–6.5)
NEUT%: 64 % (ref 39.0–75.0)
Platelets: 86 10*3/uL — ABNORMAL LOW (ref 140–400)
RBC: 3.71 10*6/uL — AB (ref 4.20–5.82)
RDW: 16.3 % — AB (ref 11.0–14.6)
WBC: 3.3 10*3/uL — AB (ref 4.0–10.3)
lymph#: 0.2 10*3/uL — ABNORMAL LOW (ref 0.9–3.3)

## 2013-10-18 LAB — COMPREHENSIVE METABOLIC PANEL (CC13)
ALT: 23 U/L (ref 0–55)
ANION GAP: 10 meq/L (ref 3–11)
AST: 29 U/L (ref 5–34)
Albumin: 3.4 g/dL — ABNORMAL LOW (ref 3.5–5.0)
Alkaline Phosphatase: 92 U/L (ref 40–150)
BUN: 8.5 mg/dL (ref 7.0–26.0)
CALCIUM: 9.4 mg/dL (ref 8.4–10.4)
CHLORIDE: 104 meq/L (ref 98–109)
CO2: 24 meq/L (ref 22–29)
CREATININE: 0.7 mg/dL (ref 0.7–1.3)
Glucose: 108 mg/dl (ref 70–140)
POTASSIUM: 4.6 meq/L (ref 3.5–5.1)
SODIUM: 138 meq/L (ref 136–145)
TOTAL PROTEIN: 6.7 g/dL (ref 6.4–8.3)
Total Bilirubin: 0.55 mg/dL (ref 0.20–1.20)

## 2013-10-18 LAB — HOLD TUBE, BLOOD BANK

## 2013-10-18 LAB — MAGNESIUM (CC13): Magnesium: 1.7 mg/dl (ref 1.5–2.5)

## 2013-10-18 MED ORDER — OLMESARTAN MEDOXOMIL 20 MG PO TABS: 20.0000 mg | ORAL_TABLET | Freq: Every morning | ORAL | Status: DC

## 2013-10-18 MED ORDER — OXYCODONE HCL 5 MG PO CAPS: 5.0000 mg | ORAL_CAPSULE | Freq: Four times a day (QID) | ORAL | Status: DC | PRN

## 2013-10-18 NOTE — Telephone Encounter (Signed)
Gave pt appt for labs and MD for January and February 2015

## 2013-10-18 NOTE — Progress Notes (Signed)
[place] OFFICE PROGRESS NOTE  Patient Care Team: Halford Chessman, MD as PCP - General (Family Medicine) Jeanann Lewandowsky as PCP - Hematology/Oncology (Hematology and Oncology) Melissa Montane, MD as Attending Physician (Otolaryngology) Eppie Gibson, MD (Radiation Oncology) Elayne Snare, MD as Attending Physician (Endocrinology) Beryle Beams, MD as Attending Physician (Gastroenterology) Heath Lark, MD as Consulting Physician (Hematology and Oncology)  DIAGNOSIS: Multiple myeloma, kappa light chain disease status post autologous stem cell transplant  SUMMARY OF ONCOLOGIC HISTORY: Oncology History   No matching staging information was found for the patient.      Multiple myeloma, in relapse   01/19/2012 Imaging The patient has CT scan of the head for evaluation of severe headaches which show abnormalities in the sinus   01/22/2012 Bone Marrow Biopsy Bone marrow biopsy show only 2% plasma cell   01/22/2012 Imaging CT scan of the sinus showed Ethmoid mucocele with some expansion of the air cells and soft tissue extending into the upper nasal cavity.    01/26/2012 Procedure Biopsy of the sinus mass confirmed plasmacytoma   02/11/2012 Imaging PET scan showed Expansile soft tissue in the right ethmoid sinus and sphenoid sinuses has an S U V max of 24.6.  No additional areas of abnormalities elsewhere.     05/20/2012 Imaging Repeat CT scan of the sinuses show complete response to treatment   03/03/2013 Imaging CT scan of the chest showed fracture of the left seventh rib represents a pathologic fracture.  There is a destructive soft tissue mass at the site of the fracture worrisome for malignancy.    03/10/2013 Bone Marrow Biopsy Repeat bone marrow biopsy again initial 2% plasma cell   03/17/2013 Imaging Repeat PET scan showed numerous lytic myelomatous bone lesions involving the spine, left ribs and left iliac bone. There were also two pancreatic lesions and possibly three liver lesions  suspicious for metastatic disease.     03/21/2013 Procedure Biopsy of the rib lesion came back plasmacytoma   03/24/2013 Procedure Fine-needle aspirate and biopsy of the pancreatic mass confirmed plasma cell    06/09/2013 - 08/09/2013 Chemotherapy The patient completed 4 months of induction chemotherapy with Revlimid, Velcade and dexamethasone along with Zometa   08/21/2013 Imaging Repeat PET scan at [facility] show persistent lytic lesions but there were no abnormalities in the pancreas or liver   09/28/2013 Bone Marrow Transplant The patient underwent autologous stem cell rescue after conditioning therapy with melphalan    INTERVAL HISTORY: Austin Griffith 53 y.o. male returns for further followup. I received numerous outside records and collaborated history with the patient. Since autologous stem cell transplant, the patient developed neutropenic fever but no growth of blood culture or urine culture were detected. He also had diarrhea that subsequently resolved conservatively. Tests for C. difficile was negative. The patient was recently discharged from [facility] and returned today for followup visit. He denies any recent fever, chills, night sweats or abnormal weight loss The patient denies any recent signs or symptoms of bleeding such as spontaneous epistaxis, hematuria or hematochezia. He complained of bilateral knee pain and bone pain. He received with oxycodone and ibuprofen. Denies any headaches. I have reviewed the past medical history, past surgical history, social history and family history with the patient and they are unchanged from previous note.  ALLERGIES:  has No Known Allergies.  MEDICATIONS:  Current Outpatient Prescriptions  Medication Sig Dispense Refill  . acyclovir (ZOVIRAX) 400 MG tablet Take 1 tablet (400 mg total) by mouth 2 (two)  times daily.  60 tablet  3  . cycloSPORINE (RESTASIS) 0.05 % ophthalmic emulsion Place 1 drop into both eyes 2 (two) times daily.      Marland Kitchen  escitalopram (LEXAPRO) 20 MG tablet Take 10 mg by mouth daily.      Marland Kitchen ibuprofen (ADVIL,MOTRIN) 200 MG tablet Take 200 mg by mouth every 6 (six) hours as needed.      . lansoprazole (PREVACID) 15 MG capsule Take 15 mg by mouth daily.      Marland Kitchen LORazepam (ATIVAN) 1 MG tablet Take 1 mg by mouth every 8 (eight) hours as needed (nausea).      . magnesium oxide (MAG-OX) 400 MG tablet Take 400 mg by mouth 2 (two) times daily.      Marland Kitchen olmesartan (BENICAR) 20 MG tablet Take 1 tablet (20 mg total) by mouth every morning.  90 tablet  3  . ondansetron (ZOFRAN) 8 MG tablet Take 1 tablet (8 mg total) by mouth every 8 (eight) hours as needed for nausea.  20 tablet  2  . oxycodone (OXY-IR) 5 MG capsule Take 1 capsule (5 mg total) by mouth every 6 (six) hours as needed for pain.  60 capsule  0  . potassium chloride SA (K-DUR,KLOR-CON) 20 MEQ tablet Take 20 mEq by mouth 2 (two) times daily.      . prochlorperazine (COMPAZINE) 10 MG tablet Take 1 tablet (10 mg total) by mouth every 6 (six) hours as needed (Nausea or vomiting).  30 tablet  1  . triamcinolone (NASACORT) 55 MCG/ACT nasal inhaler Place 2 sprays into the nose daily.      . benzonatate (TESSALON) 200 MG capsule Take by mouth 3 (three) times daily as needed.       No current facility-administered medications for this visit.    REVIEW OF SYSTEMS:   Constitutional: Denies fevers, chills or abnormal weight loss Eyes: Denies blurriness of vision Ears, nose, mouth, throat, and face: Denies mucositis or sore throat Respiratory: Denies cough, dyspnea or wheezes Cardiovascular: Denies palpitation, chest discomfort or lower extremity swelling Gastrointestinal:  Denies nausea, heartburn or change in bowel habits Skin: Denies abnormal skin rashes Lymphatics: Denies new lymphadenopathy or easy bruising Neurological:Denies numbness, tingling or new weaknesses Behavioral/Psych: Mood is stable, no new changes  All other systems were reviewed with the patient and are  negative.  PHYSICAL EXAMINATION: ECOG PERFORMANCE STATUS: 1 - Symptomatic but completely ambulatory  Filed Vitals:   10/18/13 1356  BP: 120/73  Pulse: 102  Temp: 98 F (36.7 C)  Resp: 18   Filed Weights   10/18/13 1356  Weight: 261 lb 14.4 oz (118.797 kg)    GENERAL:alert, no distress and comfortable SKIN: skin color, texture, turgor are normal, no rashes or significant lesions EYES: normal, Conjunctiva are pink and non-injected, sclera clear OROPHARYNX:no exudate, no erythema and lips, buccal mucosa, and tongue normal  NECK: supple, thyroid normal size, non-tender, without nodularity LYMPH:  no palpable lymphadenopathy in the cervical, axillary or inguinal LUNGS: clear to auscultation and percussion with normal breathing effort HEART: regular rate & rhythm and no murmurs and no lower extremity edema ABDOMEN:abdomen soft, non-tender and normal bowel sounds Musculoskeletal:no cyanosis of digits and no clubbing  NEURO: alert & oriented x 3 with fluent speech, no focal motor/sensory deficits  LABORATORY DATA:  I have reviewed the data as listed    Component Value Date/Time   NA 138 10/18/2013 1339   NA 137 09/20/2013 0458   NA 138 05/20/2012 1105  K 4.6 10/18/2013 1339   K 3.5 09/20/2013 0458   K 4.5 05/20/2012 1105   CL 105 09/20/2013 0458   CL 105 03/30/2013 1340   CL 100 05/20/2012 1105   CO2 24 10/18/2013 1339   CO2 24 09/20/2013 0458   CO2 27 05/20/2012 1105   GLUCOSE 108 10/18/2013 1339   GLUCOSE 102* 09/20/2013 0458   GLUCOSE 120* 03/30/2013 1340   GLUCOSE 110 05/20/2012 1105   BUN 8.5 10/18/2013 1339   BUN 4* 09/20/2013 0458   BUN 15 05/20/2012 1105   CREATININE 0.7 10/18/2013 1339   CREATININE 0.93 09/20/2013 0458   CREATININE 1.0 05/20/2012 1105   CALCIUM 9.4 10/18/2013 1339   CALCIUM 8.4 09/20/2013 0458   CALCIUM 9.0 05/20/2012 1105   PROT 6.7 10/18/2013 1339   PROT 5.1* 09/19/2013 0438   PROT 6.8 05/20/2012 1105   ALBUMIN 3.4* 10/18/2013 1339   ALBUMIN 2.7* 09/19/2013 0438   AST 29  10/18/2013 1339   AST 14 09/19/2013 0438   AST 30 05/20/2012 1105   ALT 23 10/18/2013 1339   ALT 21 09/19/2013 0438   ALT 52* 05/20/2012 1105   ALKPHOS 92 10/18/2013 1339   ALKPHOS 63 09/19/2013 0438   ALKPHOS 58 05/20/2012 1105   BILITOT 0.55 10/18/2013 1339   BILITOT 1.2 09/19/2013 0438   BILITOT 0.90 05/20/2012 1105   GFRNONAA >90 09/20/2013 0458   GFRAA >90 09/20/2013 0458    No results found for this basename: SPEP, UPEP,  kappa and lambda light chains    Lab Results  Component Value Date   WBC 3.3* 10/18/2013   NEUTROABS 2.1 10/18/2013   HGB 11.4* 10/18/2013   HCT 33.2* 10/18/2013   MCV 89.3 10/18/2013   PLT 86* 10/18/2013      Chemistry      Component Value Date/Time   NA 138 10/18/2013 1339   NA 137 09/20/2013 0458   NA 138 05/20/2012 1105   K 4.6 10/18/2013 1339   K 3.5 09/20/2013 0458   K 4.5 05/20/2012 1105   CL 105 09/20/2013 0458   CL 105 03/30/2013 1340   CL 100 05/20/2012 1105   CO2 24 10/18/2013 1339   CO2 24 09/20/2013 0458   CO2 27 05/20/2012 1105   BUN 8.5 10/18/2013 1339   BUN 4* 09/20/2013 0458   BUN 15 05/20/2012 1105   CREATININE 0.7 10/18/2013 1339   CREATININE 0.93 09/20/2013 0458   CREATININE 1.0 05/20/2012 1105      Component Value Date/Time   CALCIUM 9.4 10/18/2013 1339   CALCIUM 8.4 09/20/2013 0458   CALCIUM 9.0 05/20/2012 1105   ALKPHOS 92 10/18/2013 1339   ALKPHOS 63 09/19/2013 0438   ALKPHOS 58 05/20/2012 1105   AST 29 10/18/2013 1339   AST 14 09/19/2013 0438   AST 30 05/20/2012 1105   ALT 23 10/18/2013 1339   ALT 21 09/19/2013 0438   ALT 52* 05/20/2012 1105   BILITOT 0.55 10/18/2013 1339   BILITOT 1.2 09/19/2013 0438   BILITOT 0.90 05/20/2012 1105     ASSESSMENT & PLAN:  #1 multiple myeloma status post autologous stem cell transplant He is doing very well. We'll continue supportive care. Per recommendation from [facility], I will bring him back to get blood work on a weekly basis. His next appointment at [facility] would be next month. I'm awaiting further instruction whether he needs repeat  bone marrow biopsy and when he is ready to start maintenance treatment. #2 leukopenia This is likely  due to recent treatment. The patient denies recent history of fevers, cough, chills, diarrhea or dysuria. He is asymptomatic from the leukopenia. I will observe for now.  I will continue the chemotherapy at current dose without dosage adjustment.  If the leukopenia gets progressive worse in the future, I might have to delay his treatment or adjust the chemotherapy dose. #3 anemia This is likely anemia of chronic disease. The patient denies recent history of bleeding such as epistaxis, hematuria or hematochezia. He is asymptomatic from the anemia. We will observe for now.  He does not require transfusion now.  #4 thrombocytopenia This is likely due to recent treatment. The patient denies recent history of bleeding such as epistaxis, hematuria or hematochezia. He is asymptomatic from the low platelet count. I will observe for now.  he does not require transfusion now.  #5 hypertension I resumed his prescription of Benicar #6 history of hypokalemia This has resolved. We initiate taper of potassium supplements and recheck his electrolytes next week #7 bone pain This could be due to recent engraftment. I refilled his prescription of oxycodone I recommend ibuprofen as needed. I also recommend calcium with vitamin D supplement #8 antimicrobial prophylaxis The patient needs to continue taking acyclovir for at least a year.  Orders Placed This Encounter  Procedures  . CBC with Differential    Standing Status: Standing     Number of Occurrences: 9     Standing Expiration Date: 10/18/2014  . Comprehensive metabolic panel    Standing Status: Standing     Number of Occurrences: 9     Standing Expiration Date: 10/18/2014  . Magnesium    Standing Status: Standing     Number of Occurrences: 9     Standing Expiration Date: 10/18/2014   All questions were answered. The patient knows to call the clinic with any  problems, questions or concerns. No barriers to learning was detected.    [facility], [place], MD 10/18/2013 4:44 PM

## 2013-10-24 ENCOUNTER — Telehealth: Payer: Self-pay | Admitting: Hematology and Oncology

## 2013-10-24 NOTE — Telephone Encounter (Signed)
returned pt call and lvm cofirming that i changed appt to thursday per pt request

## 2013-10-25 ENCOUNTER — Other Ambulatory Visit: Payer: No Typology Code available for payment source

## 2013-10-25 ENCOUNTER — Telehealth: Payer: Self-pay | Admitting: *Deleted

## 2013-10-25 MED ORDER — LORAZEPAM 1 MG PO TABS: 1.0000 mg | ORAL_TABLET | Freq: Three times a day (TID) | ORAL | Status: DC | PRN

## 2013-10-25 NOTE — Telephone Encounter (Signed)
Wife left VM states pt needs refill on Ativan called into their pharmacy.   She says he is taking it about three times per day.

## 2013-10-25 NOTE — Telephone Encounter (Signed)
Yes, OK to refill, 90 tabs no refill

## 2013-10-25 NOTE — Telephone Encounter (Signed)
Informed wife of refill called into pharmacy.  She verbalized understanding.

## 2013-10-26 ENCOUNTER — Other Ambulatory Visit (HOSPITAL_BASED_OUTPATIENT_CLINIC_OR_DEPARTMENT_OTHER): Payer: No Typology Code available for payment source

## 2013-10-26 DIAGNOSIS — C9002 Multiple myeloma in relapse: Secondary | ICD-10-CM

## 2013-10-26 LAB — CBC WITH DIFFERENTIAL/PLATELET
BASO%: 1.1 % (ref 0.0–2.0)
BASOS ABS: 0 10*3/uL (ref 0.0–0.1)
EOS%: 0.8 % (ref 0.0–7.0)
Eosinophils Absolute: 0 10*3/uL (ref 0.0–0.5)
HCT: 33.2 % — ABNORMAL LOW (ref 38.4–49.9)
HGB: 11.6 g/dL — ABNORMAL LOW (ref 13.0–17.1)
LYMPH%: 19.5 % (ref 14.0–49.0)
MCH: 31.7 pg (ref 27.2–33.4)
MCHC: 35.1 g/dL (ref 32.0–36.0)
MCV: 90.4 fL (ref 79.3–98.0)
MONO#: 0.7 10*3/uL (ref 0.1–0.9)
MONO%: 18.9 % — AB (ref 0.0–14.0)
NEUT#: 2.1 10*3/uL (ref 1.5–6.5)
NEUT%: 59.7 % (ref 39.0–75.0)
Platelets: 124 10*3/uL — ABNORMAL LOW (ref 140–400)
RBC: 3.67 10*6/uL — AB (ref 4.20–5.82)
RDW: 19.1 % — ABNORMAL HIGH (ref 11.0–14.6)
WBC: 3.6 10*3/uL — ABNORMAL LOW (ref 4.0–10.3)
lymph#: 0.7 10*3/uL — ABNORMAL LOW (ref 0.9–3.3)

## 2013-10-26 LAB — COMPREHENSIVE METABOLIC PANEL (CC13)
ALK PHOS: 83 U/L (ref 40–150)
ALT: 30 U/L (ref 0–55)
AST: 27 U/L (ref 5–34)
Albumin: 3.5 g/dL (ref 3.5–5.0)
Anion Gap: 10 mEq/L (ref 3–11)
BILIRUBIN TOTAL: 0.83 mg/dL (ref 0.20–1.20)
BUN: 11 mg/dL (ref 7.0–26.0)
CO2: 25 mEq/L (ref 22–29)
Calcium: 9.1 mg/dL (ref 8.4–10.4)
Chloride: 105 mEq/L (ref 98–109)
Creatinine: 0.8 mg/dL (ref 0.7–1.3)
GLUCOSE: 118 mg/dL (ref 70–140)
Potassium: 4.2 mEq/L (ref 3.5–5.1)
Sodium: 140 mEq/L (ref 136–145)
Total Protein: 6.8 g/dL (ref 6.4–8.3)

## 2013-10-26 LAB — MAGNESIUM (CC13): MAGNESIUM: 1.9 mg/dL (ref 1.5–2.5)

## 2013-11-01 ENCOUNTER — Other Ambulatory Visit (HOSPITAL_BASED_OUTPATIENT_CLINIC_OR_DEPARTMENT_OTHER): Payer: No Typology Code available for payment source

## 2013-11-01 DIAGNOSIS — C9000 Multiple myeloma not having achieved remission: Secondary | ICD-10-CM

## 2013-11-01 DIAGNOSIS — C9002 Multiple myeloma in relapse: Secondary | ICD-10-CM

## 2013-11-01 LAB — CBC WITH DIFFERENTIAL/PLATELET
BASO%: 0 % (ref 0.0–2.0)
Basophils Absolute: 0 10*3/uL (ref 0.0–0.1)
EOS%: 0 % (ref 0.0–7.0)
Eosinophils Absolute: 0 10*3/uL (ref 0.0–0.5)
HCT: 30.4 % — ABNORMAL LOW (ref 38.4–49.9)
HEMOGLOBIN: 10.6 g/dL — AB (ref 13.0–17.1)
LYMPH%: 8.3 % — AB (ref 14.0–49.0)
MCH: 30.9 pg (ref 27.2–33.4)
MCHC: 34.9 g/dL (ref 32.0–36.0)
MCV: 88.6 fL (ref 79.3–98.0)
MONO#: 0.3 10*3/uL (ref 0.1–0.9)
MONO%: 6.2 % (ref 0.0–14.0)
NEUT%: 85.5 % — ABNORMAL HIGH (ref 39.0–75.0)
NEUTROS ABS: 4.5 10*3/uL (ref 1.5–6.5)
Platelets: 97 10*3/uL — ABNORMAL LOW (ref 140–400)
RBC: 3.43 10*6/uL — ABNORMAL LOW (ref 4.20–5.82)
RDW: 17.5 % — ABNORMAL HIGH (ref 11.0–14.6)
WBC: 5.2 10*3/uL (ref 4.0–10.3)
lymph#: 0.4 10*3/uL — ABNORMAL LOW (ref 0.9–3.3)

## 2013-11-01 LAB — COMPREHENSIVE METABOLIC PANEL (CC13)
ALK PHOS: 68 U/L (ref 40–150)
ALT: 36 U/L (ref 0–55)
AST: 28 U/L (ref 5–34)
Albumin: 3.7 g/dL (ref 3.5–5.0)
Anion Gap: 9 mEq/L (ref 3–11)
BILIRUBIN TOTAL: 0.75 mg/dL (ref 0.20–1.20)
BUN: 17.8 mg/dL (ref 7.0–26.0)
CO2: 24 mEq/L (ref 22–29)
Calcium: 9.1 mg/dL (ref 8.4–10.4)
Chloride: 107 mEq/L (ref 98–109)
Creatinine: 0.8 mg/dL (ref 0.7–1.3)
Glucose: 109 mg/dl (ref 70–140)
Potassium: 3.8 mEq/L (ref 3.5–5.1)
SODIUM: 140 meq/L (ref 136–145)
TOTAL PROTEIN: 6.5 g/dL (ref 6.4–8.3)

## 2013-11-01 LAB — MAGNESIUM (CC13): Magnesium: 1.9 mg/dl (ref 1.5–2.5)

## 2013-11-03 ENCOUNTER — Telehealth: Payer: Self-pay | Admitting: *Deleted

## 2013-11-03 DIAGNOSIS — D729 Disorder of white blood cells, unspecified: Secondary | ICD-10-CM

## 2013-11-03 MED ORDER — ONDANSETRON HCL 8 MG PO TABS: 8.0000 mg | ORAL_TABLET | Freq: Three times a day (TID) | ORAL | Status: DC | PRN

## 2013-11-03 NOTE — Telephone Encounter (Signed)
Wants to have labs drawn at [facility] in [place] on 11/08/13 instead of here at [facility] 11/08/13 @1215 . Also needed refill on zofran-done

## 2013-11-06 ENCOUNTER — Other Ambulatory Visit: Payer: Self-pay | Admitting: Hematology and Oncology

## 2013-11-06 ENCOUNTER — Encounter: Payer: Self-pay | Admitting: *Deleted

## 2013-11-06 DIAGNOSIS — D729 Disorder of white blood cells, unspecified: Secondary | ICD-10-CM

## 2013-11-06 DIAGNOSIS — R110 Nausea: Secondary | ICD-10-CM

## 2013-11-06 MED ORDER — ONDANSETRON HCL 8 MG PO TABS: 8.0000 mg | ORAL_TABLET | Freq: Three times a day (TID) | ORAL | Status: DC | PRN

## 2013-11-07 ENCOUNTER — Encounter: Payer: Self-pay | Admitting: *Deleted

## 2013-11-08 ENCOUNTER — Other Ambulatory Visit: Payer: No Typology Code available for payment source

## 2013-11-13 ENCOUNTER — Telehealth: Payer: Self-pay | Admitting: *Deleted

## 2013-11-13 NOTE — Telephone Encounter (Signed)
[facility] for copy of lab results drawn there last week.  Their office [phone].   They faxed over results of CBC and CMET from 11/10/13.  Dr. Alvy Bimler reviewed and instructs for pt to keep next appt as scheduled on 11/22/13.   Informed wife of labs received and for pt to keep next appt as scheduled.  She verbalized understanding.

## 2013-11-22 ENCOUNTER — Encounter: Payer: Self-pay | Admitting: Hematology and Oncology

## 2013-11-22 ENCOUNTER — Other Ambulatory Visit (HOSPITAL_BASED_OUTPATIENT_CLINIC_OR_DEPARTMENT_OTHER): Payer: No Typology Code available for payment source

## 2013-11-22 ENCOUNTER — Other Ambulatory Visit: Payer: Self-pay | Admitting: *Deleted

## 2013-11-22 ENCOUNTER — Ambulatory Visit (HOSPITAL_BASED_OUTPATIENT_CLINIC_OR_DEPARTMENT_OTHER): Payer: No Typology Code available for payment source | Admitting: Hematology and Oncology

## 2013-11-22 VITALS — BP 128/87 | HR 114 | Temp 98.9°F | Resp 18 | Ht 75.0 in | Wt 257.7 lb

## 2013-11-22 DIAGNOSIS — F341 Dysthymic disorder: Secondary | ICD-10-CM

## 2013-11-22 DIAGNOSIS — M25569 Pain in unspecified knee: Secondary | ICD-10-CM

## 2013-11-22 DIAGNOSIS — M949 Disorder of cartilage, unspecified: Secondary | ICD-10-CM

## 2013-11-22 DIAGNOSIS — F32A Depression, unspecified: Secondary | ICD-10-CM

## 2013-11-22 DIAGNOSIS — C9000 Multiple myeloma not having achieved remission: Secondary | ICD-10-CM

## 2013-11-22 DIAGNOSIS — M899 Disorder of bone, unspecified: Secondary | ICD-10-CM

## 2013-11-22 DIAGNOSIS — F329 Major depressive disorder, single episode, unspecified: Secondary | ICD-10-CM

## 2013-11-22 DIAGNOSIS — C9001 Multiple myeloma in remission: Secondary | ICD-10-CM

## 2013-11-22 DIAGNOSIS — Z9484 Stem cells transplant status: Secondary | ICD-10-CM

## 2013-11-22 DIAGNOSIS — D696 Thrombocytopenia, unspecified: Secondary | ICD-10-CM

## 2013-11-22 DIAGNOSIS — I10 Essential (primary) hypertension: Secondary | ICD-10-CM

## 2013-11-22 DIAGNOSIS — C9002 Multiple myeloma in relapse: Secondary | ICD-10-CM

## 2013-11-22 HISTORY — DX: Depression, unspecified: F32.A

## 2013-11-22 HISTORY — DX: Multiple myeloma in remission: C90.01

## 2013-11-22 LAB — CBC WITH DIFFERENTIAL/PLATELET
BASO%: 0.5 % (ref 0.0–2.0)
Basophils Absolute: 0 10*3/uL (ref 0.0–0.1)
EOS%: 2.6 % (ref 0.0–7.0)
Eosinophils Absolute: 0.1 10*3/uL (ref 0.0–0.5)
HEMATOCRIT: 30 % — AB (ref 38.4–49.9)
HEMOGLOBIN: 10.5 g/dL — AB (ref 13.0–17.1)
LYMPH%: 7.7 % — AB (ref 14.0–49.0)
MCH: 32.6 pg (ref 27.2–33.4)
MCHC: 35.1 g/dL (ref 32.0–36.0)
MCV: 92.9 fL (ref 79.3–98.0)
MONO#: 0.4 10*3/uL (ref 0.1–0.9)
MONO%: 12.2 % (ref 0.0–14.0)
NEUT#: 2.7 10*3/uL (ref 1.5–6.5)
NEUT%: 77 % — AB (ref 39.0–75.0)
PLATELETS: 83 10*3/uL — AB (ref 140–400)
RBC: 3.23 10*6/uL — ABNORMAL LOW (ref 4.20–5.82)
RDW: 18.9 % — ABNORMAL HIGH (ref 11.0–14.6)
WBC: 3.5 10*3/uL — AB (ref 4.0–10.3)
lymph#: 0.3 10*3/uL — ABNORMAL LOW (ref 0.9–3.3)

## 2013-11-22 LAB — COMPREHENSIVE METABOLIC PANEL (CC13)
ALT: 34 U/L (ref 0–55)
ANION GAP: 9 meq/L (ref 3–11)
AST: 30 U/L (ref 5–34)
Albumin: 3.7 g/dL (ref 3.5–5.0)
Alkaline Phosphatase: 81 U/L (ref 40–150)
BILIRUBIN TOTAL: 1.37 mg/dL — AB (ref 0.20–1.20)
BUN: 9.6 mg/dL (ref 7.0–26.0)
CHLORIDE: 105 meq/L (ref 98–109)
CO2: 24 meq/L (ref 22–29)
Calcium: 9.7 mg/dL (ref 8.4–10.4)
Creatinine: 0.9 mg/dL (ref 0.7–1.3)
Glucose: 116 mg/dl (ref 70–140)
Potassium: 3.7 mEq/L (ref 3.5–5.1)
Sodium: 139 mEq/L (ref 136–145)
Total Protein: 6.7 g/dL (ref 6.4–8.3)

## 2013-11-22 LAB — MAGNESIUM (CC13): Magnesium: 1.9 mg/dl (ref 1.5–2.5)

## 2013-11-22 MED ORDER — LORAZEPAM 1 MG PO TABS: 1.0000 mg | ORAL_TABLET | Freq: Three times a day (TID) | ORAL | Status: DC | PRN

## 2013-11-22 MED ORDER — ESCITALOPRAM OXALATE 20 MG PO TABS: 20.0000 mg | ORAL_TABLET | Freq: Every day | ORAL | Status: DC

## 2013-11-22 MED ORDER — OXYCODONE HCL 5 MG PO CAPS: 5.0000 mg | ORAL_CAPSULE | Freq: Four times a day (QID) | ORAL | Status: DC | PRN

## 2013-11-22 NOTE — Progress Notes (Signed)
[place] OFFICE PROGRESS NOTE  Patient Care Team: Halford Chessman, MD as PCP - General (Family Medicine) Jeanann Lewandowsky as PCP - Hematology/Oncology (Hematology and Oncology) Melissa Montane, MD as Attending Physician (Otolaryngology) Eppie Gibson, MD (Radiation Oncology) Elayne Snare, MD as Attending Physician (Endocrinology) Beryle Beams, MD as Attending Physician (Gastroenterology) Heath Lark, MD as Consulting Physician (Hematology and Oncology)  DIAGNOSIS: IgG kappa multiple myeloma status post autologous stem cell transplant for further management  SUMMARY OF ONCOLOGIC HISTORY: Oncology History   IgG Kappa light chain disease, Durie-Salmon stage III     Multiple myeloma, in relapse   01/19/2012 Imaging The patient has CT scan of the head for evaluation of severe headaches which show abnormalities in the sinus   01/22/2012 Bone Marrow Biopsy Bone marrow biopsy show only 2% plasma cell   01/22/2012 Imaging CT scan of the sinus showed Ethmoid mucocele with some expansion of the air cells and soft tissue extending into the upper nasal cavity.    01/26/2012 Procedure Biopsy of the sinus mass confirmed plasmacytoma   02/11/2012 Imaging PET scan showed Expansile soft tissue in the right ethmoid sinus and sphenoid sinuses has an S U V max of 24.6.  No additional areas of abnormalities elsewhere.     05/20/2012 Imaging Repeat CT scan of the sinuses show complete response to treatment   03/03/2013 Imaging CT scan of the chest showed fracture of the left seventh rib represents a pathologic fracture.  There is a destructive soft tissue mass at the site of the fracture worrisome for malignancy.    03/10/2013 Bone Marrow Biopsy Repeat bone marrow biopsy again initial 2% plasma cell   03/17/2013 Imaging Repeat PET scan showed numerous lytic myelomatous bone lesions involving the spine, left ribs and left iliac bone. There were also two pancreatic lesions and possibly three liver lesions  suspicious for metastatic disease.     03/21/2013 Procedure Biopsy of the rib lesion came back plasmacytoma   03/24/2013 Procedure Fine-needle aspirate and biopsy of the pancreatic mass confirmed plasma cell    06/09/2013 - 08/09/2013 Chemotherapy The patient completed 4 months of induction chemotherapy with Revlimid, Velcade and dexamethasone along with Zometa   08/21/2013 Imaging Repeat PET scan at [facility] show persistent lytic lesions but there were no abnormalities in the pancreas or liver   09/28/2013 Bone Marrow Transplant The patient underwent autologous stem cell rescue after conditioning therapy with melphalan    INTERVAL HISTORY: BRENON ANTOSH 53 y.o. male returns for further followup. He is doing well. He has some mild anxiety and depression but no suicidal ideation. His bone pain has resolved. The patient denies any recent signs or symptoms of bleeding such as spontaneous epistaxis, hematuria or hematochezia. He denies any recent fever, chills, night sweats or abnormal weight loss   I have reviewed the past medical history, past surgical history, social history and family history with the patient and they are unchanged from previous note.  ALLERGIES:  has No Known Allergies.  MEDICATIONS:  Current Outpatient Prescriptions  Medication Sig Dispense Refill  . acyclovir (ZOVIRAX) 400 MG tablet Take 1 tablet (400 mg total) by mouth 2 (two) times daily.  60 tablet  3  . cycloSPORINE (RESTASIS) 0.05 % ophthalmic emulsion Place 1 drop into both eyes 2 (two) times daily.      Marland Kitchen escitalopram (LEXAPRO) 20 MG tablet Take 1 tablet (20 mg total) by mouth daily.  90 tablet  3  . ibuprofen (ADVIL,MOTRIN) 200 MG tablet  Take 200 mg by mouth every 6 (six) hours as needed.      . lansoprazole (PREVACID) 15 MG capsule Take 15 mg by mouth daily.      Marland Kitchen LORazepam (ATIVAN) 1 MG tablet Take 1 tablet (1 mg total) by mouth every 8 (eight) hours as needed (nausea).  90 tablet  0  . magnesium oxide  (MAG-OX) 400 MG tablet Take 400 mg by mouth 2 (two) times daily.      Marland Kitchen olmesartan (BENICAR) 20 MG tablet Take 1 tablet (20 mg total) by mouth every morning.  90 tablet  3  . ondansetron (ZOFRAN) 8 MG tablet Take 1 tablet (8 mg total) by mouth every 8 (eight) hours as needed for nausea.  60 tablet  2  . oxycodone (OXY-IR) 5 MG capsule Take 1 capsule (5 mg total) by mouth every 6 (six) hours as needed for pain.  60 capsule  0  . potassium chloride SA (K-DUR,KLOR-CON) 20 MEQ tablet Take 20 mEq by mouth 2 (two) times daily.      . prochlorperazine (COMPAZINE) 10 MG tablet Take 1 tablet (10 mg total) by mouth every 6 (six) hours as needed (Nausea or vomiting).  30 tablet  1  . triamcinolone (NASACORT) 55 MCG/ACT nasal inhaler Place 2 sprays into the nose daily.       No current facility-administered medications for this visit.    REVIEW OF SYSTEMS:   Constitutional: Denies fevers, chills or abnormal weight loss Eyes: Denies blurriness of vision Ears, nose, mouth, throat, and face: Denies mucositis or sore throat Respiratory: Denies cough, dyspnea or wheezes Cardiovascular: Denies palpitation, chest discomfort or lower extremity swelling Gastrointestinal:  Denies nausea, heartburn or change in bowel habits Skin: Denies abnormal skin rashes Lymphatics: Denies new lymphadenopathy or easy bruising Neurological:Denies numbness, tingling or new weaknesses Behavioral/Psych: Mood is stable, no new changes  All other systems were reviewed with the patient and are negative.  PHYSICAL EXAMINATION: ECOG PERFORMANCE STATUS: 0 - Asymptomatic  Filed Vitals:   11/22/13 1129  BP: 128/87  Pulse: 114  Temp: 98.9 F (37.2 C)  Resp: 18   Filed Weights   11/22/13 1129  Weight: 257 lb 11.2 oz (116.892 kg)    GENERAL:alert, no distress and comfortable SKIN: skin color, texture, turgor are normal, no rashes or significant lesions EYES: normal, Conjunctiva are pink and non-injected, sclera  clear Musculoskeletal:no cyanosis of digits and no clubbing  NEURO: alert & oriented x 3 with fluent speech, no focal motor/sensory deficits  LABORATORY DATA:  I have reviewed the data as listed    Component Value Date/Time   NA 139 11/22/2013 1120   NA 137 09/20/2013 0458   NA 138 05/20/2012 1105   K 3.7 11/22/2013 1120   K 3.5 09/20/2013 0458   K 4.5 05/20/2012 1105   CL 105 09/20/2013 0458   CL 105 03/30/2013 1340   CL 100 05/20/2012 1105   CO2 24 11/22/2013 1120   CO2 24 09/20/2013 0458   CO2 27 05/20/2012 1105   GLUCOSE 116 11/22/2013 1120   GLUCOSE 102* 09/20/2013 0458   GLUCOSE 120* 03/30/2013 1340   GLUCOSE 110 05/20/2012 1105   BUN 9.6 11/22/2013 1120   BUN 4* 09/20/2013 0458   BUN 15 05/20/2012 1105   CREATININE 0.9 11/22/2013 1120   CREATININE 0.93 09/20/2013 0458   CREATININE 1.0 05/20/2012 1105   CALCIUM 9.7 11/22/2013 1120   CALCIUM 8.4 09/20/2013 0458   CALCIUM 9.0 05/20/2012 1105   PROT  6.7 11/22/2013 1120   PROT 5.1* 09/19/2013 0438   PROT 6.8 05/20/2012 1105   ALBUMIN 3.7 11/22/2013 1120   ALBUMIN 2.7* 09/19/2013 0438   AST 30 11/22/2013 1120   AST 14 09/19/2013 0438   AST 30 05/20/2012 1105   ALT 34 11/22/2013 1120   ALT 21 09/19/2013 0438   ALT 52* 05/20/2012 1105   ALKPHOS 81 11/22/2013 1120   ALKPHOS 63 09/19/2013 0438   ALKPHOS 58 05/20/2012 1105   BILITOT 1.37* 11/22/2013 1120   BILITOT 1.2 09/19/2013 0438   BILITOT 0.90 05/20/2012 1105   GFRNONAA >90 09/20/2013 0458   GFRAA >90 09/20/2013 0458    No results found for this basename: SPEP, UPEP,  kappa and lambda light chains    Lab Results  Component Value Date   WBC 3.5* 11/22/2013   NEUTROABS 2.7 11/22/2013   HGB 10.5* 11/22/2013   HCT 30.0* 11/22/2013   MCV 92.9 11/22/2013   PLT 83* 11/22/2013      Chemistry      Component Value Date/Time   NA 139 11/22/2013 1120   NA 137 09/20/2013 0458   NA 138 05/20/2012 1105   K 3.7 11/22/2013 1120   K 3.5 09/20/2013 0458   K 4.5 05/20/2012 1105   CL 105 09/20/2013 0458   CL 105  03/30/2013 1340   CL 100 05/20/2012 1105   CO2 24 11/22/2013 1120   CO2 24 09/20/2013 0458   CO2 27 05/20/2012 1105   BUN 9.6 11/22/2013 1120   BUN 4* 09/20/2013 0458   BUN 15 05/20/2012 1105   CREATININE 0.9 11/22/2013 1120   CREATININE 0.93 09/20/2013 0458   CREATININE 1.0 05/20/2012 1105      Component Value Date/Time   CALCIUM 9.7 11/22/2013 1120   CALCIUM 8.4 09/20/2013 0458   CALCIUM 9.0 05/20/2012 1105   ALKPHOS 81 11/22/2013 1120   ALKPHOS 63 09/19/2013 0438   ALKPHOS 58 05/20/2012 1105   AST 30 11/22/2013 1120   AST 14 09/19/2013 0438   AST 30 05/20/2012 1105   ALT 34 11/22/2013 1120   ALT 21 09/19/2013 0438   ALT 52* 05/20/2012 1105   BILITOT 1.37* 11/22/2013 1120   BILITOT 1.2 09/19/2013 0438   BILITOT 0.90 05/20/2012 1105     ASSESSMENT & PLAN:  #1 multiple myeloma He has undergone stem cell transplant. Apparently his most recent light chain and serum protein electrophoresis detected persistent M spike. He will continue followup at [facility]. It is not clear to me whether the patient is interested for maintain this therapy. I am waiting for direction from [place]. If the patient decides to go on maintenance therapy, I will see him back. #2 thrombocytopenia This is due to recent transplant. He is not symptomatic and I will observe #3 hypertension He will resume taking blood pressure medicine #4 anxiety and depression I increase the dose of Lexapro to 20 mg. I will attempt to wean him off Ativan in the future #5 bone pain This is improving. I refilled his prescription oxycodone to take as needed #6 antimicrobial prophylaxis He will continue taking acyclovir for at least one year. All questions were answered. The patient knows to call the clinic with any problems, questions or concerns. No barriers to learning was detected. I spent 25 minutes counseling the patient face to face. The total time spent in the appointment was 40 minutes and more than 50% was on counseling and review of test  results     [facility],  Sueellen Kayes, MD 11/22/2013 12:46 PM

## 2013-12-05 ENCOUNTER — Other Ambulatory Visit: Payer: Self-pay | Admitting: Hematology and Oncology

## 2013-12-05 ENCOUNTER — Telehealth: Payer: Self-pay | Admitting: Dietician

## 2013-12-05 NOTE — Telephone Encounter (Signed)
Brief Outpatient Oncology Nutrition Note  Patient has been identified to be at risk on malnutrition screen.  Wt Readings from Last 10 Encounters:  11/22/13 257 lb 11.2 oz (116.892 kg)  10/18/13 261 lb 14.4 oz (118.797 kg)  09/18/13 270 lb (122.471 kg)  09/18/13 270 lb 4.8 oz (122.607 kg)  09/12/13 273 lb 3.2 oz (123.923 kg)  08/09/13 274 lb 8 oz (124.512 kg)  07/05/13 272 lb 14.4 oz (123.787 kg)  06/06/13 271 lb 9.6 oz (123.197 kg)  04/28/13 276 lb 6.4 oz (125.374 kg)  04/17/13 274 lb 9.6 oz (124.558 kg)    Dx:  Multiple myeloma s/p stem cell transplant, recent light chain and serum protein electrophoresis detected persistent M spike and will continue follow up at [facility].   Called patient due to continued weight loss.  Patient was not available.  Provided contact information for the [place] [facility].  Antonieta Iba, RD, LDN

## 2014-01-09 ENCOUNTER — Telehealth: Payer: Self-pay | Admitting: *Deleted

## 2014-01-09 ENCOUNTER — Other Ambulatory Visit: Payer: Self-pay | Admitting: Hematology and Oncology

## 2014-01-09 DIAGNOSIS — F32A Depression, unspecified: Secondary | ICD-10-CM

## 2014-01-09 DIAGNOSIS — F329 Major depressive disorder, single episode, unspecified: Secondary | ICD-10-CM

## 2014-01-09 MED ORDER — LORAZEPAM 1 MG PO TABS: 1.0000 mg | ORAL_TABLET | Freq: Two times a day (BID) | ORAL | Status: DC

## 2014-01-09 NOTE — Telephone Encounter (Signed)
Faxed Ativan Rx to [facility] in [place].  Informed wife of Rx faxed and tapering dose down to twice daily.  She verbalized understanding.

## 2014-01-09 NOTE — Telephone Encounter (Signed)
pls call pt's wife that I'm initating taper to BID

## 2014-01-09 NOTE — Telephone Encounter (Signed)
Wife called states pt requests refill on his Ativan.  He is using for anxiety.  Also reports he went to [facility] a few weeks ago and his PET scan was "clear."  Pt not currently scheduled for any f/u here.

## 2014-01-11 ENCOUNTER — Other Ambulatory Visit: Payer: Self-pay | Admitting: Hematology and Oncology

## 2014-01-12 ENCOUNTER — Other Ambulatory Visit: Payer: Self-pay | Admitting: Hematology and Oncology

## 2014-01-12 DIAGNOSIS — M25569 Pain in unspecified knee: Secondary | ICD-10-CM

## 2014-01-12 DIAGNOSIS — C9001 Multiple myeloma in remission: Secondary | ICD-10-CM

## 2014-01-12 DIAGNOSIS — I10 Essential (primary) hypertension: Secondary | ICD-10-CM

## 2014-01-12 MED ORDER — OXYCODONE HCL 5 MG PO CAPS: 5.0000 mg | ORAL_CAPSULE | Freq: Four times a day (QID) | ORAL | Status: DC | PRN

## 2014-02-09 ENCOUNTER — Telehealth: Payer: Self-pay | Admitting: *Deleted

## 2014-02-09 NOTE — Telephone Encounter (Signed)
On 02-08-14 went down to the  mail room to [facility] medical records to ruby griffin at [place], it was consult note, end of tx , sim and planning note, follow up note, dos .

## 2014-03-21 ENCOUNTER — Other Ambulatory Visit: Payer: Self-pay | Admitting: Hematology and Oncology

## 2014-03-22 ENCOUNTER — Other Ambulatory Visit: Payer: Self-pay | Admitting: Hematology and Oncology

## 2014-03-22 ENCOUNTER — Telehealth: Payer: Self-pay | Admitting: *Deleted

## 2014-03-22 DIAGNOSIS — F32A Depression, unspecified: Secondary | ICD-10-CM

## 2014-03-22 DIAGNOSIS — F419 Anxiety disorder, unspecified: Secondary | ICD-10-CM

## 2014-03-22 DIAGNOSIS — C9001 Multiple myeloma in remission: Secondary | ICD-10-CM

## 2014-03-22 DIAGNOSIS — I10 Essential (primary) hypertension: Secondary | ICD-10-CM

## 2014-03-22 DIAGNOSIS — F329 Major depressive disorder, single episode, unspecified: Secondary | ICD-10-CM

## 2014-03-22 DIAGNOSIS — M25569 Pain in unspecified knee: Secondary | ICD-10-CM

## 2014-03-22 MED ORDER — LORAZEPAM 1 MG PO TABS: 1.0000 mg | ORAL_TABLET | Freq: Two times a day (BID) | ORAL | Status: DC

## 2014-03-22 MED ORDER — OXYCODONE HCL 5 MG PO CAPS: 5.0000 mg | ORAL_CAPSULE | Freq: Four times a day (QID) | ORAL | Status: DC | PRN

## 2014-03-22 NOTE — Telephone Encounter (Signed)
Informed wife of rx for ativan and oxycodone ready to pick up at cancer center. She verbalized understanding.

## 2014-04-30 ENCOUNTER — Other Ambulatory Visit: Payer: Self-pay | Admitting: Hematology and Oncology

## 2014-04-30 DIAGNOSIS — C9001 Multiple myeloma in remission: Secondary | ICD-10-CM

## 2014-04-30 DIAGNOSIS — M25562 Pain in left knee: Secondary | ICD-10-CM

## 2014-04-30 DIAGNOSIS — F329 Major depressive disorder, single episode, unspecified: Secondary | ICD-10-CM

## 2014-04-30 DIAGNOSIS — F419 Anxiety disorder, unspecified: Secondary | ICD-10-CM

## 2014-04-30 DIAGNOSIS — F32A Depression, unspecified: Secondary | ICD-10-CM

## 2014-04-30 MED ORDER — LORAZEPAM 1 MG PO TABS: 1.0000 mg | ORAL_TABLET | Freq: Two times a day (BID) | ORAL | Status: DC

## 2014-04-30 MED ORDER — OXYCODONE HCL 5 MG PO CAPS: 5.0000 mg | ORAL_CAPSULE | Freq: Four times a day (QID) | ORAL | Status: DC | PRN

## 2014-04-30 NOTE — Telephone Encounter (Signed)
LEFT MESSAGE ON HOME VOICE MAIL TO CALL THIS OFFICE TOMORROW CONCERNING PT.'S REFILLS.

## 2014-05-01 NOTE — Telephone Encounter (Signed)
DID NOT RECEIVE A RETURN CALL FROM PT.'S WIFE. SHE CAME TODAY TO PICK UP HER HUSBAND'S PRESCRIPTIONS. EXPLAIN TO PT.'S WIFE THAT SINCE PT. IS BEING SEEN AT [facility] HE WILL NEED TO GET THESE MEDICATIONS FILLED BY HIS PHYSICIAN AT [facility] IN THE FUTURE. SHE VOICES UNDERSTANDING.

## 2014-07-04 ENCOUNTER — Telehealth: Payer: Self-pay | Admitting: Hematology and Oncology

## 2014-07-04 NOTE — Telephone Encounter (Signed)
pt wife called to sched appt....done...pt aware of appt

## 2014-07-12 ENCOUNTER — Telehealth: Payer: Self-pay | Admitting: Hematology and Oncology

## 2014-07-12 NOTE — Telephone Encounter (Signed)
returned pt wife call adn cx appt per pt request he says he is doing better and does not want to r/s at this time

## 2014-07-13 ENCOUNTER — Ambulatory Visit: Payer: No Typology Code available for payment source | Admitting: Hematology and Oncology

## 2015-02-18 ENCOUNTER — Telehealth: Payer: Self-pay | Admitting: *Deleted

## 2015-02-18 NOTE — Telephone Encounter (Signed)
Pt is wanting to see Dr Alvy Bimler. Has coughed up blood, had CXR-zpack. Saw fractures on left ribs. Please advise.

## 2015-02-19 ENCOUNTER — Telehealth: Payer: Self-pay | Admitting: *Deleted

## 2015-02-19 NOTE — Telephone Encounter (Signed)
Spoke with wife. Wants to come 5/31 @ 11:30

## 2015-02-19 NOTE — Telephone Encounter (Signed)
-----   Message from Heath Lark, MD sent at 02/19/2015  8:56 AM EDT ----- Regarding: appt Earliest 5/31 at 930 or 1130 if he still wants to be seen

## 2015-02-19 NOTE — Telephone Encounter (Signed)
Austin Lark, MD at 02/19/2015 8:28 AM     Status: Signed       Expand All Collapse All   Coughing up blood is serious, he needs urgent evaluation by PCP/urgent care. I have no availability next 2 weeks. His previous PET scan showed rib lesions before, so probably not new       Spoke with wife. Gave her message from Dr Alvy Bimler and encouraged her to take pt to PCP or Urgent care. States cough is better since zpack.

## 2015-02-19 NOTE — Telephone Encounter (Signed)
Coughing up blood is serious, he needs urgent evaluation by PCP/urgent care. I have no availability next 2 weeks. His previous PET scan showed rib lesions before, so probably not new

## 2015-02-24 ENCOUNTER — Emergency Department (HOSPITAL_COMMUNITY): Payer: No Typology Code available for payment source

## 2015-02-24 ENCOUNTER — Encounter (HOSPITAL_COMMUNITY): Payer: Self-pay

## 2015-02-24 ENCOUNTER — Emergency Department (HOSPITAL_COMMUNITY)
Admission: EM | Admit: 2015-02-24 | Discharge: 2015-02-24 | Disposition: A | Discharge status: Home / Self Care | Payer: No Typology Code available for payment source | Attending: Emergency Medicine | Admitting: Emergency Medicine

## 2015-02-24 DIAGNOSIS — Z862 Personal history of diseases of the blood and blood-forming organs and certain disorders involving the immune mechanism: Secondary | ICD-10-CM | POA: Diagnosis not present

## 2015-02-24 DIAGNOSIS — Z8669 Personal history of other diseases of the nervous system and sense organs: Secondary | ICD-10-CM | POA: Insufficient documentation

## 2015-02-24 DIAGNOSIS — Z79899 Other long term (current) drug therapy: Secondary | ICD-10-CM | POA: Diagnosis not present

## 2015-02-24 DIAGNOSIS — Z85820 Personal history of malignant melanoma of skin: Secondary | ICD-10-CM | POA: Insufficient documentation

## 2015-02-24 DIAGNOSIS — B029 Zoster without complications: Secondary | ICD-10-CM | POA: Insufficient documentation

## 2015-02-24 DIAGNOSIS — Z8589 Personal history of malignant neoplasm of other organs and systems: Secondary | ICD-10-CM | POA: Diagnosis not present

## 2015-02-24 DIAGNOSIS — F419 Anxiety disorder, unspecified: Secondary | ICD-10-CM | POA: Diagnosis not present

## 2015-02-24 DIAGNOSIS — K219 Gastro-esophageal reflux disease without esophagitis: Secondary | ICD-10-CM | POA: Diagnosis not present

## 2015-02-24 DIAGNOSIS — M79605 Pain in left leg: Secondary | ICD-10-CM | POA: Diagnosis present

## 2015-02-24 DIAGNOSIS — F329 Major depressive disorder, single episode, unspecified: Secondary | ICD-10-CM | POA: Diagnosis not present

## 2015-02-24 DIAGNOSIS — I10 Essential (primary) hypertension: Secondary | ICD-10-CM | POA: Diagnosis not present

## 2015-02-24 DIAGNOSIS — Z791 Long term (current) use of non-steroidal anti-inflammatories (NSAID): Secondary | ICD-10-CM | POA: Diagnosis not present

## 2015-02-24 DIAGNOSIS — R52 Pain, unspecified: Secondary | ICD-10-CM

## 2015-02-24 HISTORY — DX: Zoster without complications: B02.9

## 2015-02-24 LAB — URINALYSIS, ROUTINE W REFLEX MICROSCOPIC
BILIRUBIN URINE: NEGATIVE
Glucose, UA: NEGATIVE mg/dL
Hgb urine dipstick: NEGATIVE
Ketones, ur: NEGATIVE mg/dL
LEUKOCYTES UA: NEGATIVE
Nitrite: NEGATIVE
Protein, ur: NEGATIVE mg/dL
Specific Gravity, Urine: >1.03 — ABNORMAL HIGH (ref 1.005–1.030)
UROBILINOGEN UA: 0.2 mg/dL (ref 0.0–1.0)
pH: 6 (ref 5.0–8.0)

## 2015-02-24 LAB — COMPREHENSIVE METABOLIC PANEL
ALT: 33 U/L (ref 17–63)
ANION GAP: 7 (ref 5–15)
AST: 23 U/L (ref 15–41)
Albumin: 3.4 g/dL — ABNORMAL LOW (ref 3.5–5.0)
Alkaline Phosphatase: 81 U/L (ref 38–126)
BILIRUBIN TOTAL: 0.7 mg/dL (ref 0.3–1.2)
BUN: 14 mg/dL (ref 6–20)
CO2: 21 mmol/L — AB (ref 22–32)
CREATININE: 1.21 mg/dL (ref 0.61–1.24)
Calcium: 8.5 mg/dL — ABNORMAL LOW (ref 8.9–10.3)
Chloride: 108 mmol/L (ref 101–111)
GFR calc non Af Amer: >60 mL/min (ref >60)
GLUCOSE: 109 mg/dL — AB (ref 65–99)
Potassium: 3.9 mmol/L (ref 3.5–5.1)
Sodium: 136 mmol/L (ref 135–145)
Total Protein: 6.6 g/dL (ref 6.5–8.1)

## 2015-02-24 LAB — CBC WITH DIFFERENTIAL/PLATELET
BASOS ABS: 0 10*3/uL (ref 0.0–0.1)
Basophils Relative: 0 % (ref 0–1)
EOS ABS: 0.1 10*3/uL (ref 0.0–0.7)
Eosinophils Relative: 3 % (ref 0–5)
HEMATOCRIT: 31.5 % — AB (ref 39.0–52.0)
HEMOGLOBIN: 10.8 g/dL — AB (ref 13.0–17.0)
LYMPHS ABS: 0.4 10*3/uL — AB (ref 0.7–4.0)
Lymphocytes Relative: 13 % (ref 12–46)
MCH: 33.8 pg (ref 26.0–34.0)
MCHC: 34.3 g/dL (ref 30.0–36.0)
MCV: 98.4 fL (ref 78.0–100.0)
MONO ABS: 0.2 10*3/uL (ref 0.1–1.0)
Monocytes Relative: 7 % (ref 3–12)
NEUTROS ABS: 2 10*3/uL (ref 1.7–7.7)
NEUTROS PCT: 77 % (ref 43–77)
PLATELETS: 84 10*3/uL — AB (ref 150–400)
RBC: 3.2 MIL/uL — ABNORMAL LOW (ref 4.22–5.81)
RDW: 12.5 % (ref 11.5–15.5)
WBC: 2.6 10*3/uL — AB (ref 4.0–10.5)

## 2015-02-24 LAB — CK: Total CK: 71 U/L (ref 49–397)

## 2015-02-24 LAB — SEDIMENTATION RATE: Sed Rate: 88 mm/hr — ABNORMAL HIGH (ref 0–16)

## 2015-02-24 LAB — C-REACTIVE PROTEIN: CRP: 1 mg/dL — ABNORMAL HIGH (ref <1.0)

## 2015-02-24 MED ORDER — OXYCODONE-ACETAMINOPHEN 5-325 MG PO TABS: 1.0000 | ORAL_TABLET | ORAL | Status: DC | PRN

## 2015-02-24 MED ORDER — VALACYCLOVIR HCL 1 G PO TABS: 1000.0000 mg | ORAL_TABLET | Freq: Three times a day (TID) | ORAL | Status: DC

## 2015-02-24 MED ORDER — OXYCODONE-ACETAMINOPHEN 5-325 MG PO TABS
2.0000 | ORAL_TABLET | Freq: Once | ORAL | Status: AC
Start: 2015-02-24 — End: 2015-02-24
  Administered 2015-02-24: 2 via ORAL
  Filled 2015-02-24: qty 2

## 2015-02-24 NOTE — ED Notes (Signed)
Pt reports pain in left thigh for the past 3 or 4 days.  Reports history of avascular necrosis in left femur and had surgery 8 years ago.  Also reports avascular necrosis r hip and had hip replacement 20 years ago.  History of multiple myeloma and had stem cell transplant 1 1/2 years ago.  Also reports rash on left thigh and buttocks area.

## 2015-02-24 NOTE — Discharge Instructions (Signed)
Shingles Follow up with Dr. Whitney Muse this week. Take the antiviral medication as prescribed. Return to the ED if you develop new or worsening symptoms. Shingles (herpes zoster) is an infection that is caused by the same virus that causes chickenpox (varicella). The infection causes a painful skin rash and fluid-filled blisters, which eventually break open, crust over, and heal. It may occur in any area of the body, but it usually affects only one side of the body or face. The pain of shingles usually lasts about 1 month. However, some people with shingles may develop long-term (chronic) pain in the affected area of the body. Shingles often occurs many years after the person had chickenpox. It is more common:  In people older than 50 years.  In people with weakened immune systems, such as those with HIV, AIDS, or cancer.  In people taking medicines that weaken the immune system, such as transplant medicines.  In people under great stress. CAUSES  Shingles is caused by the varicella zoster virus (VZV), which also causes chickenpox. After a person is infected with the virus, it can remain in the person's body for years in an inactive state (dormant). To cause shingles, the virus reactivates and breaks out as an infection in a nerve root. The virus can be spread from person to person (contagious) through contact with open blisters of the shingles rash. It will only spread to people who have not had chickenpox. When these people are exposed to the virus, they may develop chickenpox. They will not develop shingles. Once the blisters scab over, the person is no longer contagious and cannot spread the virus to others. SIGNS AND SYMPTOMS  Shingles shows up in stages. The initial symptoms may be pain, itching, and tingling in an area of the skin. This pain is usually described as burning, stabbing, or throbbing.In a few days or weeks, a painful red rash will appear in the area where the pain, itching, and  tingling were felt. The rash is usually on one side of the body in a band or belt-like pattern. Then, the rash usually turns into fluid-filled blisters. They will scab over and dry up in approximately 2-3 weeks. Flu-like symptoms may also occur with the initial symptoms, the rash, or the blisters. These may include:  Fever.  Chills.  Headache.  Upset stomach. DIAGNOSIS  Your health care provider will perform a skin exam to diagnose shingles. Skin scrapings or fluid samples may also be taken from the blisters. This sample will be examined under a microscope or sent to a lab for further testing. TREATMENT  There is no specific cure for shingles. Your health care provider will likely prescribe medicines to help you manage the pain, recover faster, and avoid long-term problems. This may include antiviral drugs, anti-inflammatory drugs, and pain medicines. HOME CARE INSTRUCTIONS   Take a cool bath or apply cool compresses to the area of the rash or blisters as directed. This may help with the pain and itching.   Take medicines only as directed by your health care provider.   Rest as directed by your health care provider.  Keep your rash and blisters clean with mild soap and cool water or as directed by your health care provider.  Do not pick your blisters or scratch your rash. Apply an anti-itch cream or numbing creams to the affected area as directed by your health care provider.  Keep your shingles rash covered with a loose bandage (dressing).  Avoid skin contact with:  Babies.  Pregnant women.   Children with eczema.   Elderly people with transplants.   People with chronic illnesses, such as leukemia or AIDS.   Wear loose-fitting clothing to help ease the pain of material rubbing against the rash.  Keep all follow-up visits as directed by your health care provider.If the area involved is on your face, you may receive a referral for a specialist, such as an eye doctor  (ophthalmologist) or an ear, nose, and throat (ENT) doctor. Keeping all follow-up visits will help you avoid eye problems, chronic pain, or disability.  SEEK IMMEDIATE MEDICAL CARE IF:   You have facial pain, pain around the eye area, or loss of feeling on one side of your face.  You have ear pain or ringing in your ear.  You have loss of taste.  Your pain is not relieved with prescribed medicines.   Your redness or swelling spreads.   You have more pain and swelling.  Your condition is worsening or has changed.   You have a fever. MAKE SURE YOU:  Understand these instructions.  Will watch your condition.  Will get help right away if you are not doing well or get worse. Document Released: 09/28/2005 Document Revised: 02/12/2014 Document Reviewed: 05/12/2012 [facility] Patient Information 2015 [place], [place]. This information is not intended to replace advice given to you by your health care provider. Make sure you discuss any questions you have with your health care provider.

## 2015-02-24 NOTE — ED Provider Notes (Signed)
CSN: [id number]     Arrival date & time 02/24/15  0907 History  This chart was scribed for Ezequiel Essex, MD by Thea Alken, ED Scribe. This patient was seen in room APA08/APA08 and the patient's care was started at 9:26 AM.   Chief Complaint  Patient presents with  . Leg Pain    Patient is a 54 y.o. male presenting with leg pain. The history is provided by the patient.  Leg Pain Associated symptoms: no fever     HPI Comments:  Austin Griffith is a 54 y.o. male who present to the Emergency Department complaining of sudden left leg pain that began 4 days ago. Pt reports burning left leg pain that starts at left lower back and shoots down to left knee.  Pt has associated rash to left upper leg which he states began after leg pain. Pt denies recent fall and injury.  He reports hx left leg surgery and had rods placed in left leg. Pt has taken OTC medication and has taken a muscle relaxer initially prescribed for previous neck pain without relief to pain. Pt has also tried applying ice to left leg without relief to pain. Pt was seen last week for a cough and was prescribed a zpack which he completed 5-6 days ago.  Pt denies fever, CP, cough, SOB, bladder and bowel incontinence. Pt has hx of multiple myeloma but states is cancer free. Pt reports stem cell replacement 09/2013. Pt next visit with oncology is 5/31 and is followed by Dr. Alvy Bimler. He also has hx of HTN and anxiety which is well controlled. Pt denies taking immunosuppressants. Pt denies tick bites, recent camping trips, and recent travels.   Past Medical History  Diagnosis Date  . Hypertension   . Plasmacytoma      Right Ethmoid Sinus; bone marrow biopsy on 03/10/13 showed normal myeloma FISH panel and cytogenetics.   . Hyperlipidemia     diet control   . AVN (avascular necrosis of bone)     bilateral hips; s/p hip replacement in 1991 and 2009  . Headache(784.0) 02/08/2012    Right Sided Headache Behind Right Eye  . Epistaxis 02/08/2012   . Blurred vision     Present for "many years"  . Fatigue   . Acid reflux   . History of radiation therapy 02/29/12-04/08/12    right  ethmoid sinus in 50.4Gy in 20 fxs  . Fatigue 05/25/12    "Mild"  . Thrombocytopenia 05/25/12  . 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 Bone metastases 03/31/13     MR Abdomen -Lower Thoracic and Upper Lumbar spine  . Liver lesion 03/31/13    MR Abdomen  . Pancreatic lesion 03/31/13     MR Abdomen - Ucinate Process  . On antineoplastic chemotherapy started 04/08/13    Revlimid/ Velcade/ Dex  . S/P radiation therapy 03/28/2013    Left posterior 7th Rib / 8Gy in 1 fraction  . Multiple myeloma, in relapse 07/05/2013  . Anxiety 07/05/2013  . Nausea alone 09/12/2013  . Neutropenic fever 09/18/2013  . Knee pain 10/18/2013  . Multiple myeloma in remission 11/22/2013  . Depression 11/22/2013   Past Surgical History  Procedure Laterality Date  . Bilateral hip replacement  1991; and 2009    due to bilateral hip AVN's. / reports left hip was not replaced  . Mass biopsy  01/26/12    Right Ethmoid Mass - Plasma Cell Neoplasm  . Eus N/A 03/24/2013    Procedure: UPPER  ENDOSCOPIC ULTRASOUND (EUS) LINEAR;  Surgeon: Beryle Beams, MD;  Location: [facility];  Service: Endoscopy;  Laterality: N/A;  . Fine needle aspiration N/A 03/24/2013    Procedure: FINE NEEDLE ASPIRATION (FNA) LINEAR;  Surgeon: Beryle Beams, MD;  Location: [facility];  Service: Endoscopy;  Laterality: N/A;   Family History  Problem Relation Age of Onset  . Adopted: Yes   History  Substance Use Topics  . Smoking status: Never Smoker   . Smokeless tobacco: Never Used  . Alcohol Use: Yes     Comment: Hx of Alcohol Abuse.09/18/13 denies but says drinks etoh    Review of Systems  Constitutional: Negative for fever and chills.  Respiratory: Negative for cough and shortness of breath.   Cardiovascular: Negative for chest pain.  Genitourinary: Negative for enuresis.  Musculoskeletal:  Positive for myalgias.  Skin: Positive for rash.  A complete 10 system review of systems was obtained and all systems are negative except as noted in the HPI and PMH.   Allergies  Review of patient's allergies indicates no known allergies.  Home Medications   Prior to Admission medications   Medication Sig Start Date End Date Taking? Authorizing Provider  acetaZOLAMIDE (DIAMOX) 250 MG tablet Take 2 tablets by mouth 2 (two) times daily. 02/11/15  Yes Historical Provider, MD  escitalopram (LEXAPRO) 20 MG tablet Take 1 tablet (20 mg total) by mouth daily. 11/22/13  Yes Heath Lark, MD  ibuprofen (ADVIL,MOTRIN) 200 MG tablet Take 200 mg by mouth every 6 (six) hours as needed.   Yes Historical Provider, MD  lansoprazole (PREVACID) 15 MG capsule Take 15 mg by mouth daily.   Yes Historical Provider, MD  LORazepam (ATIVAN) 1 MG tablet Take 1 tablet (1 mg total) by mouth 2 (two) times daily. 04/30/14  Yes Heath Lark, MD  nabumetone (RELAFEN) 750 MG tablet Take 750 mg by mouth 2 (two) times daily. 02/14/15  Yes Historical Provider, MD  olmesartan (BENICAR) 20 MG tablet Take 1 tablet (20 mg total) by mouth every morning. Patient taking differently: Take 10 mg by mouth every morning.  10/18/13  Yes Heath Lark, MD  ondansetron (ZOFRAN) 8 MG tablet Take 1 tablet (8 mg total) by mouth every 8 (eight) hours as needed for nausea. 11/06/13  Yes Heath Lark, MD  acyclovir (ZOVIRAX) 400 MG tablet Take 1 tablet (400 mg total) by mouth 2 (two) times daily. Patient not taking: Reported on 02/24/2015 08/09/13   Heath Lark, MD  oxycodone (OXY-IR) 5 MG capsule Take 1 capsule (5 mg total) by mouth every 6 (six) hours as needed for pain. Patient not taking: Reported on 02/24/2015 04/30/14   Heath Lark, MD  oxyCODONE-acetaminophen (PERCOCET/ROXICET) 5-325 MG per tablet Take 1 tablet by mouth every 4 (four) hours as needed for severe pain. 02/24/15   Ezequiel Essex, MD  valACYclovir (VALTREX) 1000 MG tablet Take 1 tablet (1,000 mg  total) by mouth 3 (three) times daily. 02/24/15 03/10/15  Ezequiel Essex, MD   BP 111/80 mmHg  Pulse 68  Temp(Src) 97.6 F (36.4 C) (Oral)  Resp 17  Ht 6' 5"  (1.956 m)  Wt 254 lb (115.214 kg)  BMI 30.11 kg/m2  SpO2 100% Physical Exam  Constitutional: He is oriented to person, place, and time. He appears well-developed and well-nourished. No distress.  Pt appears uncomfortable   HENT:  Head: Normocephalic and atraumatic.  Mouth/Throat: Oropharynx is clear and moist. No oropharyngeal exudate.  Eyes: Conjunctivae and EOM are normal. Pupils are equal, round, and  reactive to light.  Neck: Normal range of motion. Neck supple.  No meningismus.  Cardiovascular: Normal rate, regular rhythm, normal heart sounds and intact distal pulses.   No murmur heard. Pulmonary/Chest: Effort normal and breath sounds normal. No respiratory distress.  Abdominal: Soft. There is no tenderness. There is no rebound and no guarding.  Musculoskeletal: Normal range of motion. He exhibits no edema or tenderness.  5/5 strength in bilateral lower extremities. Ankle plantar and dorsiflexion intact. Great toe extension intact bilaterally. +2 DP and PT pulses found with doppler. +2 patellar reflexes bilaterally. Normal gait.  Neurological: He is alert and oriented to person, place, and time. No cranial nerve deficit. He exhibits normal muscle tone. Coordination normal.  No ataxia on finger to nose bilaterally. No pronator drift. 5/5 strength throughout. CN 2-12 intact. Negative Romberg. Equal grip strength. Sensation intact. Gait is normal.   Skin: Skin is warm.  Scabbed vesicluar rash that starts mid lumbar spine and involves left paraspinal muscles ,left lateral thigh and medial thigh.   Psychiatric: He has a normal mood and affect. His behavior is normal.  Nursing note and vitals reviewed.   ED Course  Procedures (including critical care time) DIAGNOSTIC STUDIES: Oxygen Saturation is 100% on RA, normal by my  interpretation.    COORDINATION OF CARE: 9:56 AM- Pt advised of plan for treatment and pt agrees. 12:54 PM- results dicussed with patient. Pt agrees to pain medication, viral medication and steroids.. He agrees to f/u with Dr.Penland or Dr. Alvy Bimler this week.   Labs Review Labs Reviewed  URINALYSIS, ROUTINE W REFLEX MICROSCOPIC - Abnormal; Notable for the following:    Specific Gravity, Urine >1.030 (*)    All other components within normal limits  CBC WITH DIFFERENTIAL/PLATELET - Abnormal; Notable for the following:    WBC 2.6 (*)    RBC 3.20 (*)    Hemoglobin 10.8 (*)    HCT 31.5 (*)    Platelets 84 (*)    Lymphs Abs 0.4 (*)    All other components within normal limits  COMPREHENSIVE METABOLIC PANEL - Abnormal; Notable for the following:    CO2 21 (*)    Glucose, Bld 109 (*)    Calcium 8.5 (*)    Albumin 3.4 (*)    All other components within normal limits  SEDIMENTATION RATE - Abnormal; Notable for the following:    Sed Rate 88 (*)    All other components within normal limits  CK  C-REACTIVE PROTEIN    Imaging Review US Venous Img Lower Unilateral Left  02/24/2015   CLINICAL DATA:  54 year old male with left lower extremity pain and swelling.  EXAM: LEFT LOWER EXTREMITY VENOUS DOPPLER ULTRASOUND  TECHNIQUE: Gray-scale sonography with graded compression, as well as color Doppler and duplex ultrasound were performed to evaluate the lower extremity deep venous systems from the level of the common femoral vein and including the common femoral, femoral, profunda femoral, popliteal and calf veins including the posterior tibial, peroneal and gastrocnemius veins when visible. The superficial great saphenous vein was also interrogated. Spectral Doppler was utilized to evaluate flow at rest and with distal augmentation maneuvers in the common femoral, femoral and popliteal veins.  COMPARISON:  None.  FINDINGS: Deep venous system appears patent and compressible from groin through popliteal  fossae and calf veins.  Spontaneous venous flow present with evidence of respiratory phasicity. Augmentation intact.  No intraluminal thrombus identified.  Visualized portions of the greater saphenous veins patent.  IMPRESSION: No evidence of deep  venous thrombosis.   Electronically Signed   By: Margarette Canada M.D.   On: 02/24/2015 11:10   Dg Chest Portable 1 View  02/24/2015   CLINICAL DATA:  Multiple myeloma.  Leg pain.  EXAM: PORTABLE CHEST - 1 VIEW  COMPARISON:  09/18/2013  FINDINGS: Lungs are under aerated with bibasilar atelectasis. Right hilum has become prominent. Adenopathy is not excluded. Normal heart size. No pneumothorax.  IMPRESSION: Possible right hilar adenopathy.  CT can be performed to evaluate.  Bibasilar atelectasis.   Electronically Signed   By: Marybelle Killings M.D.   On: 02/24/2015 12:47   Dg Hip Unilat With Pelvis 2-3 Views Left  02/24/2015   CLINICAL DATA:  Left hip pain for 2 days.  EXAM: LEFT HIP (WITH PELVIS) 2-3 VIEWS  COMPARISON:  PET-CT dated 03/17/2013  FINDINGS: Dynamic compression screw and plate are present in the proximal left femur. Sclerotic lesion in the left femoral head is not significantly changed. Osteopenia. Right hip hemiarthroplasty is anatomically aligned. There is no breakage or loosening of the hardware. No acute fracture.  IMPRESSION: No acute bony pathology. Chronic and postoperative changes are noted.   Electronically Signed   By: Marybelle Killings M.D.   On: 02/24/2015 10:59   Dg Femur Min 2 Views Left  02/24/2015   CLINICAL DATA:  Left femur pain for 2 days. History of femoral AVN. History of multiple myeloma.  EXAM: LEFT FEMUR 2 VIEWS  COMPARISON:  02/11/2012  FINDINGS: There is no evidence of acute fracture, subluxation or dislocation.  Internal plate and screw fixation within the proximal left femur identified.  AVN changes within the left femoral head again identified without flattening.  No suspicious focal bony lesions are identified.  Mild degenerative  changes of the hip noted.  IMPRESSION: No evidence of acute abnormality.  Unchanged surgical hardware within the proximal left femur -no evidence of complicating features.  Left femoral head AVN again noted.   Electronically Signed   By: Margarette Canada M.D.   On: 02/24/2015 11:06     EKG Interpretation None      MDM   Final diagnoses:  Pain  Herpes zoster  L leg pain and rash x 3 days.  No fever. No trauma.  Hx AVN s/p surgery.  Rash suspicious for zoster.  Vesicular rash in dermatomal distribution.  Labs show stable pancytopenia. Xrays negative.  No DVT.   discussed with Dr. Whitney Muse  Who agrees treating as zoster. She wishes patient to follow-up in the office this week. He has been bouncing back and forth between [facility] and [facility]. He has no fever.   His labs appear to be at baseline. He is not neutropenic. His pain is controlled and he is able to ambulate. He understands need for close followup this week.  I personally performed the services described in this documentation, which was scribed in my presence. The recorded information has been reviewed and is accurate.    Ezequiel Essex, MD 02/24/15 [phone]

## 2015-02-24 NOTE — ED Notes (Signed)
Patient with no complaints at this time. Respirations even and unlabored. Skin warm/dry. Discharge instructions reviewed with patient at this time. Patient given opportunity to voice concerns/ask questions. Patient discharged at this time and left Emergency Department with steady gait.   

## 2015-02-25 ENCOUNTER — Telehealth: Payer: Self-pay | Admitting: Hematology and Oncology

## 2015-02-25 NOTE — Telephone Encounter (Signed)
returned call adn s.w. pt wife and cx appt .Marland KitchenMarland Kitchenpt is going to [facility]

## 2015-02-26 ENCOUNTER — Encounter (HOSPITAL_COMMUNITY)
Payer: No Typology Code available for payment source | Attending: Hematology & Oncology | Admitting: Hematology & Oncology

## 2015-02-26 ENCOUNTER — Encounter (HOSPITAL_COMMUNITY): Payer: Self-pay | Admitting: Hematology & Oncology

## 2015-02-26 ENCOUNTER — Encounter (HOSPITAL_BASED_OUTPATIENT_CLINIC_OR_DEPARTMENT_OTHER): Payer: No Typology Code available for payment source

## 2015-02-26 VITALS — BP 96/64 | HR 73 | Temp 98.1°F | Resp 18 | Wt 271.0 lb

## 2015-02-26 DIAGNOSIS — C9000 Multiple myeloma not having achieved remission: Secondary | ICD-10-CM | POA: Diagnosis not present

## 2015-02-26 DIAGNOSIS — D649 Anemia, unspecified: Secondary | ICD-10-CM | POA: Diagnosis not present

## 2015-02-26 DIAGNOSIS — C9001 Multiple myeloma in remission: Secondary | ICD-10-CM | POA: Insufficient documentation

## 2015-02-26 DIAGNOSIS — B029 Zoster without complications: Secondary | ICD-10-CM | POA: Diagnosis not present

## 2015-02-26 DIAGNOSIS — M25562 Pain in left knee: Secondary | ICD-10-CM | POA: Insufficient documentation

## 2015-02-26 DIAGNOSIS — D729 Disorder of white blood cells, unspecified: Secondary | ICD-10-CM | POA: Diagnosis not present

## 2015-02-26 LAB — COMPREHENSIVE METABOLIC PANEL
ALK PHOS: 93 U/L (ref 38–126)
ALT: 34 U/L (ref 17–63)
ANION GAP: 8 (ref 5–15)
AST: 26 U/L (ref 15–41)
Albumin: 3.9 g/dL (ref 3.5–5.0)
BILIRUBIN TOTAL: 0.8 mg/dL (ref 0.3–1.2)
BUN: 15 mg/dL (ref 6–20)
CHLORIDE: 107 mmol/L (ref 101–111)
CO2: 21 mmol/L — ABNORMAL LOW (ref 22–32)
Calcium: 8.8 mg/dL — ABNORMAL LOW (ref 8.9–10.3)
Creatinine, Ser: 1.23 mg/dL (ref 0.61–1.24)
GFR calc Af Amer: >60 mL/min (ref >60)
GFR calc non Af Amer: >60 mL/min (ref >60)
Glucose, Bld: 92 mg/dL (ref 65–99)
POTASSIUM: 4.3 mmol/L (ref 3.5–5.1)
Sodium: 136 mmol/L (ref 135–145)
Total Protein: 7.3 g/dL (ref 6.5–8.1)

## 2015-02-26 LAB — CBC WITH DIFFERENTIAL/PLATELET
BASOS ABS: 0 10*3/uL (ref 0.0–0.1)
Basophils Relative: 1 % (ref 0–1)
Eosinophils Absolute: 0.1 10*3/uL (ref 0.0–0.7)
Eosinophils Relative: 4 % (ref 0–5)
HCT: 34.2 % — ABNORMAL LOW (ref 39.0–52.0)
HEMOGLOBIN: 11.7 g/dL — AB (ref 13.0–17.0)
LYMPHS ABS: 0.7 10*3/uL (ref 0.7–4.0)
Lymphocytes Relative: 19 % (ref 12–46)
MCH: 33.6 pg (ref 26.0–34.0)
MCHC: 34.2 g/dL (ref 30.0–36.0)
MCV: 98.3 fL (ref 78.0–100.0)
MONOS PCT: 16 % — AB (ref 3–12)
Monocytes Absolute: 0.5 10*3/uL (ref 0.1–1.0)
NEUTROS PCT: 61 % (ref 43–77)
Neutro Abs: 2.1 10*3/uL (ref 1.7–7.7)
Platelets: 95 10*3/uL — ABNORMAL LOW (ref 150–400)
RBC: 3.48 MIL/uL — AB (ref 4.22–5.81)
RDW: 12.9 % (ref 11.5–15.5)
WBC: 3.5 10*3/uL — ABNORMAL LOW (ref 4.0–10.5)

## 2015-02-26 MED ORDER — OXYCODONE HCL 5 MG PO CAPS: 5.0000 mg | ORAL_CAPSULE | ORAL | Status: DC | PRN

## 2015-02-26 MED ORDER — ACYCLOVIR 400 MG PO TABS: ORAL_TABLET | ORAL | Status: DC

## 2015-02-26 MED ORDER — VALACYCLOVIR HCL 1 G PO TABS: ORAL_TABLET | ORAL | Status: DC

## 2015-02-26 NOTE — Patient Instructions (Signed)
..  [place] at [facility] Discharge Instructions  RECOMMENDATIONS MADE BY THE CONSULTANT AND ANY TEST RESULTS WILL BE SENT TO YOUR REFERRING PHYSICIAN.  Exam today per Dr. Whitney Muse We will draw additional labs today  Prescriptions given to patient for additional valtrex and pain med Also prescription given to patient for acyclovir to start after finished with valtrex  Thank you for choosing [place] at [facility] to provide your oncology and hematology care.  To afford each patient quality time with our provider, please arrive at least 15 minutes before your scheduled appointment time.    You need to re-schedule your appointment should you arrive 10 or more minutes late.  We strive to give you quality time with our providers, and arriving late affects you and other patients whose appointments are after yours.  Also, if you no show three or more times for appointments you may be dismissed from the clinic at the providers discretion.     Again, thank you for choosing [facility].  Our hope is that these requests will decrease the amount of time that you wait before being seen by our physicians.       _____________________________________________________________  Should you have questions after your visit to [facility], please contact our office at [phone] between the hours of 8:30 a.m. and 4:30 p.m.  Voicemails left after 4:30 p.m. will not be returned until the following business day.  For prescription refill requests, have your pharmacy contact our office.

## 2015-02-26 NOTE — Progress Notes (Signed)
Austin Kilts, MD [address] / [place] [address]  DIAGNOSIS: IgG kappa light chain disease, Durie, Salmon Stage III  SUMMARY OF ONCOLOGIC HISTORY: Oncology History   IgG Kappa light chain disease, Durie-Salmon stage III     Multiple myeloma, in relapse   01/19/2012 Imaging The patient has CT scan of the head for evaluation of severe headaches which show abnormalities in the sinus   01/22/2012 Bone Marrow Biopsy Bone marrow biopsy show only 2% plasma cell   01/22/2012 Imaging CT scan of the sinus showed Ethmoid mucocele with some expansion of the air cells and soft tissue extending into the upper nasal cavity.    01/26/2012 Procedure Biopsy of the sinus mass confirmed plasmacytoma   02/11/2012 Imaging PET scan showed Expansile soft tissue in the right ethmoid sinus and sphenoid sinuses has an S U V max of 24.6.  No additional areas of abnormalities elsewhere.     05/20/2012 Imaging Repeat CT scan of the sinuses show complete response to treatment   03/03/2013 Imaging CT scan of the chest showed fracture of the left seventh rib represents a pathologic fracture.  There is a destructive soft tissue mass at the site of the fracture worrisome for malignancy.    03/10/2013 Bone Marrow Biopsy Repeat bone marrow biopsy again initial 2% plasma cell   03/17/2013 Imaging Repeat PET scan showed numerous lytic myelomatous bone lesions involving the spine, left ribs and left iliac bone. There were also two pancreatic lesions and possibly three liver lesions suspicious for metastatic disease.     03/21/2013 Procedure Biopsy of the rib lesion came back plasmacytoma   03/24/2013 Procedure Fine-needle aspirate and biopsy of the pancreatic mass confirmed plasma cell    06/09/2013 - 08/09/2013 Chemotherapy The patient completed 4 months of induction chemotherapy with Revlimid, Velcade and dexamethasone along with Zometa   08/21/2013 Imaging Repeat PET scan at [facility] show persistent lytic lesions but there  were no abnormalities in the pancreas or liver   09/28/2013 Bone Marrow Transplant The patient underwent autologous stem cell rescue after conditioning therapy with melphalan    CURRENT THERAPY:  INTERVAL HISTORY: Austin Griffith 54 y.o. male returns for follow up. He was diagnosed with a solitary IgA kappa plasma cell neoplasm of the right ethmoid sinus and sphenoid sinus. He underwent evaluation with a bone marrow biopsy serum studies and PET imaging. BMBX had 2% plasma cells and PET was negative.  He was treated with radiation only. He then went on to develop multiple plasmacytomas of the pancreas, rib and potentially liver. He received local radiation to the 7th rib on 03/28/2013. Repeat PET imaging showed multiple lytic myelomatous bone lesions involving the spine, left ribs and left iliac bone. Pancreatic biopsy was positive for plasma cells. He was started on RVD 03/2013 with cycle #3 given on 06/05/2013.   He underwent an autologous stem cell transplant on 09/28/13.  He has a history of alcoholism, bilateral hip AVN.  He just stopped his acyclovir and presented to the ED at [facility] over the weekend. Diagnosis was shingles. He describes significant discomfort. On review of his medical records he has not formally seen Dr. Alvy Bimler since 11/2013. He was last seen at [facility] in March of this year has no establish follow-up with them until December. He was advised in the emergency department to follow-up with his treating oncologist. Since they live in [place] they thought it was easier to come to the cancer center here.  He  states he was not aware he needed ongoing follow-up after his transplant other than at [facility].  He is currently taking Valtrex and has oxycodone for pain.  On additional review of his medical records he appears to have episodes of noncompliance. He was never on a bisphosphonate such as Zometa. Dr. Alvy Bimler had discussed this with the patient prior to transplant.  MEDICAL  HISTORY: Past Medical History  Diagnosis Date  . Hypertension   . Plasmacytoma      Right Ethmoid Sinus; bone marrow biopsy on 03/10/13 showed normal myeloma FISH panel and cytogenetics.   . Hyperlipidemia     diet control   . AVN (avascular necrosis of bone)     bilateral hips; s/p hip replacement in 1991 and 2009  . Headache(784.0) 02/08/2012    Right Sided Headache Behind Right Eye  . Epistaxis 02/08/2012  . Blurred vision     Present for "many years"  . Fatigue   . Acid reflux   . History of radiation therapy 02/29/12-04/08/12    right  ethmoid sinus in 50.4Gy in 20 fxs  . Fatigue 05/25/12    "Mild"  . Thrombocytopenia 05/25/12  . 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 Bone metastases 03/31/13     MR Abdomen -Lower Thoracic and Upper Lumbar spine  . Liver lesion 03/31/13    MR Abdomen  . Pancreatic lesion 03/31/13     MR Abdomen - Ucinate Process  . On antineoplastic chemotherapy started 04/08/13    Revlimid/ Velcade/ Dex  . S/P radiation therapy 03/28/2013    Left posterior 7th Rib / 8Gy in 1 fraction  . Multiple myeloma, in relapse 07/05/2013  . Anxiety 07/05/2013  . Nausea alone 09/12/2013  . Neutropenic fever 09/18/2013  . Knee pain 10/18/2013  . Multiple myeloma in remission 11/22/2013  . Depression 11/22/2013  . Shingles Feb 24, 2015    has Plasma cell disorder; Hypertension; Headache(784.0); Epistaxis; Adrenal insufficiency; Multiple myeloma, in relapse; Anxiety; Needs flu shot; Nausea alone; Neutropenic fever; Knee pain; Malaise and fatigue; History of peripheral stem cell transplant; Multiple myeloma in remission; and Depression on his problem list.     has No Known Allergies.  Current Outpatient Prescriptions on File Prior to Visit  Medication Sig Dispense Refill  . acetaZOLAMIDE (DIAMOX) 250 MG tablet Take 2 tablets by mouth 2 (two) times daily.    Marland Kitchen escitalopram (LEXAPRO) 20 MG tablet Take 1 tablet (20 mg total) by mouth daily. 90 tablet 3  . lansoprazole  (PREVACID) 15 MG capsule Take 15 mg by mouth daily.    Marland Kitchen LORazepam (ATIVAN) 1 MG tablet Take 1 tablet (1 mg total) by mouth 2 (two) times daily. 90 tablet 0  . nabumetone (RELAFEN) 750 MG tablet Take 750 mg by mouth 2 (two) times daily.  0  . olmesartan (BENICAR) 20 MG tablet Take 1 tablet (20 mg total) by mouth every morning. (Patient taking differently: Take 10 mg by mouth every morning. ) 90 tablet 3  . oxyCODONE-acetaminophen (PERCOCET/ROXICET) 5-325 MG per tablet Take 1 tablet by mouth every 4 (four) hours as needed for severe pain. 15 tablet 0  . valACYclovir (VALTREX) 1000 MG tablet Take 1 tablet (1,000 mg total) by mouth 3 (three) times daily. 30 tablet 0  . acyclovir (ZOVIRAX) 400 MG tablet Take 1 tablet (400 mg total) by mouth 2 (two) times daily. (Patient not taking: Reported on 02/26/2015) 60 tablet 3  . ibuprofen (ADVIL,MOTRIN) 200 MG tablet Take 200 mg by mouth every  6 (six) hours as needed.    . ondansetron (ZOFRAN) 8 MG tablet Take 1 tablet (8 mg total) by mouth every 8 (eight) hours as needed for nausea. (Patient not taking: Reported on 02/26/2015) 60 tablet 2  . oxycodone (OXY-IR) 5 MG capsule Take 1 capsule (5 mg total) by mouth every 6 (six) hours as needed for pain. (Patient not taking: Reported on 02/24/2015) 90 capsule 0   No current facility-administered medications on file prior to visit.     SURGICAL HISTORY: Past Surgical History  Procedure Laterality Date  . Bilateral hip replacement  1991; and 2009    due to bilateral hip AVN's. / reports left hip was not replaced  . Mass biopsy  01/26/12    Right Ethmoid Mass - Plasma Cell Neoplasm  . Eus N/A 03/24/2013    Procedure: UPPER ENDOSCOPIC ULTRASOUND (EUS) LINEAR;  Surgeon: Beryle Beams, MD;  Location: [facility];  Service: Endoscopy;  Laterality: N/A;  . Fine needle aspiration N/A 03/24/2013    Procedure: FINE NEEDLE ASPIRATION (FNA) LINEAR;  Surgeon: Beryle Beams, MD;  Location: [facility];  Service: Endoscopy;   Laterality: N/A;    SOCIAL HISTORY: History   Social History  . Marital Status: Married    Spouse Name: N/A  . Number of Children: 2  . Years of Education: N/A   Occupational History  . owner     owns a dump truck [employer] Topics  . Smoking status: Never Smoker   . Smokeless tobacco: Never Used  . Alcohol Use: Yes     Comment: Hx of Alcohol Abuse.09/18/13 denies but says drinks etoh  . Drug Use: No  . Sexual Activity: Yes    Birth Control/ Protection: None   Other Topics Concern  . Not on file   Social History Narrative   Married   Financial controller of [employer]    FAMILY HISTORY: Family History  Problem Relation Age of Onset  . Adopted: Yes    Review of Systems  Constitutional: Negative for fever, chills, weight loss and malaise/fatigue.  HENT: Negative for congestion, hearing loss, nosebleeds, sore throat and tinnitus.   Eyes: Negative for blurred vision, double vision, pain and discharge.  Respiratory: Negative for cough, hemoptysis, sputum production, shortness of breath and wheezing.   Cardiovascular: Negative for chest pain, palpitations, claudication, leg swelling and PND.  Gastrointestinal: Negative for heartburn, nausea, vomiting, abdominal pain, diarrhea, constipation, blood in stool and melena.  Genitourinary: Negative for dysuria, urgency, frequency and hematuria.  Musculoskeletal: Positive for myalgias, back pain and joint pain. Negative for falls.  Skin: Positive for rash. Negative for itching.  Neurological: Negative for dizziness, tingling, tremors, sensory change, speech change, focal weakness, seizures, loss of consciousness, weakness and headaches.  Endo/Heme/Allergies: Does not bruise/bleed easily.  Psychiatric/Behavioral: Negative for depression, suicidal ideas, memory loss and substance abuse. The patient is not nervous/anxious and does not have insomnia.     PHYSICAL EXAMINATION  ECOG PERFORMANCE STATUS: 1 - Symptomatic  but completely ambulatory  Filed Vitals:   02/26/15 1139  BP: 96/64  Pulse: 73  Temp: 98.1 F (36.7 C)  Resp: 18    Physical Exam  Constitutional: He is oriented to person, place, and time and well-developed, well-nourished, and in no distress.  HENT:  Head: Normocephalic and atraumatic.  Nose: Nose normal.  Mouth/Throat: Oropharynx is clear and moist. No oropharyngeal exudate.  Eyes: Conjunctivae and EOM are normal. Pupils are equal, round, and reactive to  light. Right eye exhibits no discharge. Left eye exhibits no discharge. No scleral icterus.  Neck: Normal range of motion. Neck supple. No tracheal deviation present. No thyromegaly present.  Cardiovascular: Normal rate, regular rhythm and normal heart sounds.  Exam reveals no gallop and no friction rub.   No murmur heard. Pulmonary/Chest: Effort normal and breath sounds normal. He has no wheezes. He has no rales.  Abdominal: Soft. Bowel sounds are normal. He exhibits no distension and no mass. There is no tenderness. There is no rebound and no guarding.  Musculoskeletal: Normal range of motion. He exhibits no edema.  Lymphadenopathy:    He has no cervical adenopathy.  Neurological: He is alert and oriented to person, place, and time. He has normal reflexes. No cranial nerve deficit. Gait normal. Coordination normal.  Skin: Skin is warm and dry. Rash noted.  Erythematous vesicular rash in L4 dermatomal distribution LLE from posterior back to anterior thigh. Lesions are all crusted  Psychiatric: Mood, memory, affect and judgment normal.  Nursing note and vitals reviewed.   LABORATORY DATA:  CBC    Component Value Date/Time   WBC 2.6* 02/24/2015 1013   WBC 3.5* 11/22/2013 1120   RBC 3.20* 02/24/2015 1013   RBC 3.23* 11/22/2013 1120   HGB 10.8* 02/24/2015 1013   HGB 10.5* 11/22/2013 1120   HCT 31.5* 02/24/2015 1013   HCT 30.0* 11/22/2013 1120   PLT 84* 02/24/2015 1013   PLT 83* 11/22/2013 1120   MCV 98.4 02/24/2015  1013   MCV 92.9 11/22/2013 1120   MCH 33.8 02/24/2015 1013   MCH 32.6 11/22/2013 1120   MCHC 34.3 02/24/2015 1013   MCHC 35.1 11/22/2013 1120   RDW 12.5 02/24/2015 1013   RDW 18.9* 11/22/2013 1120   LYMPHSABS 0.4* 02/24/2015 1013   LYMPHSABS 0.3* 11/22/2013 1120   MONOABS 0.2 02/24/2015 1013   MONOABS 0.4 11/22/2013 1120   EOSABS 0.1 02/24/2015 1013   EOSABS 0.1 11/22/2013 1120   BASOSABS 0.0 02/24/2015 1013   BASOSABS 0.0 11/22/2013 1120   CMP     Component Value Date/Time   NA 136 02/24/2015 1013   NA 139 11/22/2013 1120   NA 138 05/20/2012 1105   K 3.9 02/24/2015 1013   K 3.7 11/22/2013 1120   K 4.5 05/20/2012 1105   CL 108 02/24/2015 1013   CL 105 03/30/2013 1340   CL 100 05/20/2012 1105   CO2 21* 02/24/2015 1013   CO2 24 11/22/2013 1120   CO2 27 05/20/2012 1105   GLUCOSE 109* 02/24/2015 1013   GLUCOSE 116 11/22/2013 1120   GLUCOSE 120* 03/30/2013 1340   GLUCOSE 110 05/20/2012 1105   BUN 14 02/24/2015 1013   BUN 9.6 11/22/2013 1120   BUN 15 05/20/2012 1105   CREATININE 1.21 02/24/2015 1013   CREATININE 0.9 11/22/2013 1120   CREATININE 1.0 05/20/2012 1105   CALCIUM 8.5* 02/24/2015 1013   CALCIUM 9.7 11/22/2013 1120   CALCIUM 9.0 05/20/2012 1105   PROT 6.6 02/24/2015 1013   PROT 6.7 11/22/2013 1120   PROT 6.8 05/20/2012 1105   ALBUMIN 3.4* 02/24/2015 1013   ALBUMIN 3.7 11/22/2013 1120   AST 23 02/24/2015 1013   AST 30 11/22/2013 1120   AST 30 05/20/2012 1105   ALT 33 02/24/2015 1013   ALT 34 11/22/2013 1120   ALT 52* 05/20/2012 1105   ALKPHOS 81 02/24/2015 1013   ALKPHOS 81 11/22/2013 1120   ALKPHOS 58 05/20/2012 1105   BILITOT 0.7 02/24/2015 1013  BILITOT 1.37* 11/22/2013 1120   BILITOT 0.90 05/20/2012 1105   GFRNONAA >60 02/24/2015 1013   GFRAA >60 02/24/2015 1013       ASSESSMENT and THERAPY PLAN:  Multiple extra medullary plasmacytoma was Original bone marrow biopsy at presentation showing 2% plasma cells with negative PET Autologous bone  marrow transplant at [facility] in December 2014 PET/CT on August 2015 negative at [facility]  He is pancytopenic. Looking over his labs from [place] he has been chronically thrombocytopenic since transplant. Anemia and leukopenia are worse.  It is unclear to me if he underwent another bone marrow biopsy prior to beginning RVD therapy in 2014. He has never been on a bisphosphonate. He is currently not on any maintenance therapy.  He has just discontinued prophylactic acyclovir and now has an outbreak of shingles. I have instructed him to complete his course of Valtrex and go back on maintenance acyclovir.  I emphasized the importance of ongoing follow-up with a medical oncologist. I advised he and his wife that they can return to [facility] in follow with Dr. Alvy Bimler on a regular basis. It would not be unreasonable to repeat his PET imaging in the near future, we will also obtain myeloma labs today including a light chain ratio. Moving forward I do not feel it would be unreasonable for him to be on bisphosphonate therapy. For now they wish to stay local as it is easier to get here. I have recommended seeing him back in several weeks to reassess how he is doing and review his laboratory work from today. We will make additional recommendations at follow-up.  All questions were answered. The patient knows to call the clinic with any problems, questions or concerns. We can certainly see the patient much sooner if necessary. This note was electronically signed.  This document serves as a record of services personally performed by Ancil Linsey, MD. It was created on her behalf by Pearlie Oyster, a trained medical scribe. The creation of this record is based on the scribe's personal observations and the provider's statements to them. This document has been checked and approved by the attending provider.    I have reviewed the above documentation for accuracy and completeness, and I agree with the  above.  Kelby Fam. Penland MD

## 2015-02-27 ENCOUNTER — Encounter (HOSPITAL_COMMUNITY): Payer: Self-pay | Admitting: Hematology & Oncology

## 2015-02-27 LAB — IMMUNOFIXATION ELECTROPHORESIS
IGA: 390 mg/dL — AB (ref 90–386)
IgG (Immunoglobin G), Serum: 934 mg/dL (ref 700–1600)
IgM, Serum: 88 mg/dL (ref 20–172)
Total Protein ELP: 6.7 g/dL (ref 6.0–8.5)

## 2015-02-27 LAB — KAPPA/LAMBDA LIGHT CHAINS
KAPPA FREE LGHT CHN: 32.68 mg/L — AB (ref 3.30–19.40)
Kappa, lambda light chain ratio: 0.7 (ref 0.26–1.65)
LAMDA FREE LIGHT CHAINS: 46.47 mg/L — AB (ref 5.71–26.30)

## 2015-02-27 LAB — PROTEIN ELECTROPHORESIS, SERUM
A/G Ratio: 1 (ref 0.7–2.0)
Albumin ELP: 3.4 g/dL (ref 3.2–5.6)
Alpha-1-Globulin: 0.3 g/dL (ref 0.1–0.4)
Alpha-2-Globulin: 0.8 g/dL (ref 0.4–1.2)
BETA GLOBULIN: 1.2 g/dL (ref 0.6–1.3)
GLOBULIN, TOTAL: 3.3 g/dL (ref 2.0–4.5)
Gamma Globulin: 1 g/dL (ref 0.5–1.6)
TOTAL PROTEIN ELP: 6.7 g/dL (ref 6.0–8.5)

## 2015-02-27 LAB — IGG, IGA, IGM
IGG (IMMUNOGLOBIN G), SERUM: 869 mg/dL (ref 700–1600)
IgA: 389 mg/dL — ABNORMAL HIGH (ref 90–386)
IgM, Serum: 86 mg/dL (ref 20–172)

## 2015-02-27 NOTE — Progress Notes (Signed)
LABS DRAWN

## 2015-03-12 ENCOUNTER — Ambulatory Visit: Payer: No Typology Code available for payment source | Admitting: Hematology and Oncology

## 2015-03-29 ENCOUNTER — Ambulatory Visit (HOSPITAL_COMMUNITY): Payer: No Typology Code available for payment source | Admitting: Hematology & Oncology

## 2015-04-03 ENCOUNTER — Encounter (HOSPITAL_COMMUNITY): Payer: No Typology Code available for payment source | Admitting: Hematology & Oncology

## 2015-04-23 NOTE — Progress Notes (Signed)
This encounter was created in error - please disregard.

## 2015-04-25 ENCOUNTER — Ambulatory Visit (HOSPITAL_COMMUNITY): Payer: No Typology Code available for payment source | Admitting: Hematology & Oncology

## 2015-04-28 NOTE — Progress Notes (Signed)
This encounter was created in error - please disregard.

## 2015-05-03 MED ORDER — HEPARIN SOD (PORK) LOCK FLUSH 100 UNIT/ML IV SOLN
INTRAVENOUS | Status: AC
  Filled 2015-05-03: qty 5

## 2015-05-10 ENCOUNTER — Encounter (HOSPITAL_COMMUNITY): Payer: Self-pay

## 2015-06-23 ENCOUNTER — Other Ambulatory Visit (HOSPITAL_COMMUNITY): Payer: Self-pay | Admitting: Hematology & Oncology

## 2015-06-24 MED ORDER — HEPARIN SOD (PORK) LOCK FLUSH 100 UNIT/ML IV SOLN
INTRAVENOUS | Status: AC
  Filled 2015-06-24: qty 5

## 2015-08-01 MED ORDER — HEPARIN SOD (PORK) LOCK FLUSH 100 UNIT/ML IV SOLN
INTRAVENOUS | Status: AC
  Filled 2015-08-01: qty 5

## 2015-09-29 ENCOUNTER — Encounter (HOSPITAL_COMMUNITY): Payer: Self-pay | Admitting: Oncology

## 2015-09-29 DIAGNOSIS — Z91199 Patient's noncompliance with other medical treatment and regimen due to unspecified reason: Secondary | ICD-10-CM

## 2015-09-29 DIAGNOSIS — Z9119 Patient's noncompliance with other medical treatment and regimen: Secondary | ICD-10-CM | POA: Insufficient documentation

## 2015-09-29 HISTORY — DX: Patient's noncompliance with other medical treatment and regimen: Z91.19

## 2015-09-29 HISTORY — DX: Patient's noncompliance with other medical treatment and regimen due to unspecified reason: Z91.199

## 2015-09-29 NOTE — Assessment & Plan Note (Addendum)
IgG kappa light chain multiple myeloma, Stage III (DS staging), S/P autologous stem cell transplant on 09/28/2013 at [facility].  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 He underwent evaluation with a bone marrow biopsy serum studies and PET imaging. BMBX had 2% plasma cells and PET was negative. He was treated with radiation only. He then went on to develop multiple plasmacytomas of the pancreas, rib and potentially liver. He received local radiation to the 7th rib on 03/28/2013. Repeat PET imaging showed multiple lytic myelomatous bone lesions involving the spine, left ribs and left iliac bone. Pancreatic biopsy was positive for plasma cells. He was started on RVD 03/2013 with cycle #3 given on 06/05/2013.   Problem listed updated as it was not previously updated.  Staging completed in [facility] problem list.  Labs today: CBC diff, CMET, LDH, ESR, CRP, SPEP+IFE, light chain assay, and B2M  Zometa has been discussed with the patient in the past.  I again brought it up today and he declines this intervention.    He wishes to continue follow-up here locally.  He was upset with my tardiness this afternoon.  I have recommend Ocean Spray PRN and a humidifier in the bedroom at home.  Return in 3-4 weeks for follow-up on epistaxis and follow-up.  Depending on lab results from today, further lab testing can be performed.  Addendum:  His CBC is solid with a normal HGB and WBC.  His platelet count is chronically low dating back to his time of transplant.  Today, his platelet count is one of his best over the past 1-2 years.  I called his cell phone personally with this news and his wife is informed of the information.  She will relay it to him. 

## 2015-09-29 NOTE — Progress Notes (Signed)
Austin Griffith, [place] [address]  Multiple myeloma in remission [name]) - Plan: CBC with Differential, Comprehensive metabolic panel, Lactate dehydrogenase, Sedimentation rate, Beta 2 microglobuline, serum, Kappa/lambda light chains, IgG, IgA, IgM, Immunofixation electrophoresis, Protein electrophoresis, serum  Medically noncompliant  Preventative health care - Plan: Influenza vac split quadrivalent PF (FLUARIX) injection 0.5 mL  Depression - Plan: LORazepam (ATIVAN) 1 MG tablet  Anxiety - Plan: LORazepam (ATIVAN) 1 MG tablet  CURRENT THERAPY: Surveillance  INTERVAL HISTORY: Austin Griffith 54 y.o. male returns for followup of IgG kappa light chain multiple myeloma, Stage III (DS staging), S/P autologous stem cell transplant on 09/28/2013 at [facility].  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 He underwent evaluation with a bone marrow biopsy serum studies and PET imaging. BMBX had 2% plasma cells and PET was negative. He was treated with radiation only. He then went on to develop multiple plasmacytomas of the pancreas, rib and potentially liver. He received local radiation to the 7th rib on 03/28/2013. Repeat PET imaging showed multiple lytic myelomatous bone lesions involving the spine, left ribs and left iliac bone. Pancreatic biopsy was positive for plasma cells. He was started on RVD 03/2013 with cycle #3 given on 06/05/2013.  AND Medical noncompliance with the following missed appointment at the [facility] since establishing his oncology care in May 2016: 03/29/2015, 04/03/2015, 04/25/2015. AND Persistent thrombocytopenia since stem cell transplant.  Oncology History   IgG Kappa light chain disease, Durie-Salmon stage III     Multiple myeloma ([name])   01/19/2012 Imaging The patient has CT scan of the head for evaluation of severe headaches which  show abnormalities in the sinus   01/22/2012 Bone Marrow Biopsy Bone marrow biopsy show only 2% plasma cell   01/22/2012 Imaging CT scan of the sinus showed Ethmoid mucocele with some expansion of the air cells and soft tissue extending into the upper nasal cavity.    01/26/2012 Procedure Biopsy of the sinus mass confirmed plasmacytoma   02/11/2012 Imaging PET scan showed Expansile soft tissue in the right ethmoid sinus and sphenoid sinuses has an S U V max of 24.6.  No additional areas of abnormalities elsewhere.     05/20/2012 Imaging Repeat CT scan of the sinuses show complete response to treatment   03/03/2013 Imaging CT scan of the chest showed fracture of the left seventh rib represents a pathologic fracture.  There is a destructive soft tissue mass at the site of the fracture worrisome for malignancy.    03/10/2013 Bone Marrow Biopsy Repeat bone marrow biopsy again initial 2% plasma cell   03/17/2013 Imaging Repeat PET scan showed numerous lytic myelomatous bone lesions involving the spine, left ribs and left iliac bone. There were also two pancreatic lesions and possibly three liver lesions suspicious for metastatic disease.     03/21/2013 Procedure Biopsy of the rib lesion came back plasmacytoma   03/24/2013 Procedure Fine-needle aspirate and biopsy of the pancreatic mass confirmed plasma cell    06/09/2013 - 08/09/2013 Chemotherapy The patient completed 4 months of induction chemotherapy with Revlimid, Velcade and dexamethasone along with Zometa   08/21/2013 Imaging Repeat PET scan at [facility] show persistent lytic lesions but there were no abnormalities in the pancreas or liver   09/28/2013 Bone Marrow Transplant The patient underwent autologous stem cell rescue after conditioning therapy with melphalan   02/26/2015  Miscellaneous Established oncology care at  [name]   02/26/2015 Miscellaneous Non-complicance with appointments: No show on: 03/29/2015, 04/03/2015, 04/25/2015.    Patient is  upset that he has been waiting 1.5 hours past his appointment time.  Unfortunately, I ran behind today and he expressed his frustrations which are understandable.  I personally reviewed and went over laboratory results with the patient.  The results are noted within this dictation.  Labs are updated today.  Platelet count is stable.  He has been a no-show or missed his past appointments with [place] as documented above.  I frankly asked him where he would like to continue his medical oncology care as I reported to him his missed appointment dates.  His wife adamantly reports that they prefer follow-up with [place].  .  He notes that he has an upcoming appointment with Jeanann Lewandowsky, MD at [facility] for his MM follow-up.  He is encouraged to keep this appointment, although he reports that he would rather see [place] and have Dr. Alvie Heidelberg see his records "through the computer."  I will defer that to the patient and Dr. Alvie Heidelberg.  We would be glad to be his local support oncologist knowing that if/when needed, Dr. Alvie Heidelberg is only a phone call away.  "I need a refill on my lorazepam."  I not that he last had #90 filled by Dr. Alvy Bimler in [place] in July.  He does not use them often.  Given his noncompliance with appointments, I have refilled #30 as an attempt to keep him coming back for appointments.  He notes epistaxis x 6 weeks.  He denies seeing his primary care provider regarding this.  He is concerned about this epistaxis because, "this is how it all started when they diagnosed me with multiple myeloma."    Past Medical History  Diagnosis Date  . Hypertension   . Plasmacytoma ([place])      Right Ethmoid Sinus; bone marrow biopsy on 03/10/13 showed normal myeloma FISH panel and cytogenetics.   . Hyperlipidemia     diet control   . AVN (avascular necrosis of bone) (HCC)     bilateral hips; s/p hip replacement in 1991 and 2009  . Headache(784.0) 02/08/2012    Right Sided Headache Behind Right Eye  . Epistaxis  02/08/2012  . Blurred vision     Present for "many years"  . Fatigue   . Acid reflux   . History of radiation therapy 02/29/12-04/08/12    right  ethmoid sinus in 50.4Gy in 20 fxs  . Fatigue 05/25/12    "Mild"  . Thrombocytopenia ([place]) 05/25/12  . Extramedullary plasmacytoma in relapse ([place]) 04/10/2013  . Bone metastases ([place]) 03/31/13     MR Abdomen -Lower Thoracic and Upper Lumbar spine  . Liver lesion 03/31/13    MR Abdomen  . Pancreatic lesion 03/31/13     MR Abdomen - Ucinate Process  . On antineoplastic chemotherapy started 04/08/13    Revlimid/ Velcade/ Dex  . S/P radiation therapy 03/28/2013    Left posterior 7th Rib / 8Gy in 1 fraction  . Multiple myeloma, in relapse 07/05/2013  . Anxiety 07/05/2013  . Nausea alone 09/12/2013  . Neutropenic fever ([place]) 09/18/2013  . Knee pain 10/18/2013  . Multiple myeloma in remission ([place]) 11/22/2013  . Depression 11/22/2013  . Shingles Feb 24, 2015  . Medically noncompliant 09/29/2015    has Hypertension; Adrenal insufficiency ([place]); Multiple myeloma ([place]); Anxiety; History of peripheral stem cell transplant ([place]); Depression; and  Medically noncompliant on his problem list.     has No Known Allergies.  Current Outpatient Prescriptions on File Prior to Visit  Medication Sig Dispense Refill  . acetaZOLAMIDE (DIAMOX) 250 MG tablet Take 2 tablets by mouth 2 (two) times daily.    Marland Kitchen acyclovir (ZOVIRAX) 400 MG tablet take 1 tablet by mouth twice a day 60 tablet 3  . escitalopram (LEXAPRO) 20 MG tablet Take 1 tablet (20 mg total) by mouth daily. 90 tablet 3  . ibuprofen (ADVIL,MOTRIN) 200 MG tablet Take 200 mg by mouth every 6 (six) hours as needed.    . lansoprazole (PREVACID) 15 MG capsule Take 15 mg by mouth daily.    Marland Kitchen olmesartan (BENICAR) 20 MG tablet Take 1 tablet (20 mg total) by mouth every morning. (Patient taking differently: Take 10 mg by mouth every morning. ) 90 tablet 3  . ondansetron (ZOFRAN) 8 MG tablet Take 1 tablet (8 mg total) by  mouth every 8 (eight) hours as needed for nausea. 60 tablet 2  . nabumetone (RELAFEN) 750 MG tablet Take 750 mg by mouth 2 (two) times daily. Reported on 09/30/2015  0   No current facility-administered medications on file prior to visit.    Past Surgical History  Procedure Laterality Date  . Bilateral hip replacement  1991; and 2009    due to bilateral hip AVN's. / reports left hip was not replaced  . Mass biopsy  01/26/12    Right Ethmoid Mass - Plasma Cell Neoplasm  . Eus N/A 03/24/2013    Procedure: UPPER ENDOSCOPIC ULTRASOUND (EUS) LINEAR;  Surgeon: Beryle Beams, MD;  Location: [facility] ENDOSCOPY;  Service: Endoscopy;  Laterality: N/A;  . Fine needle aspiration N/A 03/24/2013    Procedure: FINE NEEDLE ASPIRATION (FNA) LINEAR;  Surgeon: Beryle Beams, MD;  Location: [facility] ENDOSCOPY;  Service: Endoscopy;  Laterality: N/A;    Denies any headaches, dizziness, double vision, fevers, chills, night sweats, nausea, vomiting, diarrhea, constipation, chest pain, heart palpitations, shortness of breath, blood in stool, black tarry stool, urinary pain, urinary burning, urinary frequency, hematuria.   PHYSICAL EXAMINATION  ECOG PERFORMANCE STATUS: 0 - Asymptomatic  Filed Vitals:   09/30/15 1314  BP: 126/76  Pulse: 84  Temp: 97.6 F (36.4 C)  Resp: 18    GENERAL:alert, no distress, comfortable, obese and upset with my tardiness, accompanied by his wife and son, Austin Griffith. SKIN: skin color, texture, turgor are normal, no rashes or significant lesions HEAD: Normocephalic, No masses, lesions, tenderness or abnormalities EYES: normal, PERRLA, EOMI, Conjunctiva are pink and non-injected EARS: External ears normal OROPHARYNX:lips, buccal mucosa, and tongue normal and mucous membranes are moist  NECK: supple, no adenopathy, thyroid normal size, non-tender, without nodularity, no stridor, non-tender, trachea midline LYMPH:  no palpable lymphadenopathy BREAST:not examined LUNGS: clear to auscultation    HEART: regular rate & rhythm ABDOMEN:abdomen soft, obese and normal bowel sounds BACK: Back symmetric, no curvature. EXTREMITIES:less then 2 second capillary refill, no joint deformities, effusion, or inflammation, no skin discoloration, no cyanosis  NEURO: alert & oriented x 3 with fluent speech, no focal motor/sensory deficits, gait normal   LABORATORY DATA: CBC    Component Value Date/Time   WBC 5.4 09/30/2015 1500   WBC 3.5* 11/22/2013 1120   RBC 4.00* 09/30/2015 1500   RBC 3.23* 11/22/2013 1120   HGB 13.9 09/30/2015 1500   HGB 10.5* 11/22/2013 1120   HCT 39.8 09/30/2015 1500   HCT 30.0* 11/22/2013 1120   PLT 109* 09/30/2015 1500  PLT 83* 11/22/2013 1120   MCV 99.5 09/30/2015 1500   MCV 92.9 11/22/2013 1120   MCH 34.8* 09/30/2015 1500   MCH 32.6 11/22/2013 1120   MCHC 34.9 09/30/2015 1500   MCHC 35.1 11/22/2013 1120   RDW 12.7 09/30/2015 1500   RDW 18.9* 11/22/2013 1120   LYMPHSABS 1.0 09/30/2015 1500   LYMPHSABS 0.3* 11/22/2013 1120   MONOABS 0.4 09/30/2015 1500   MONOABS 0.4 11/22/2013 1120   EOSABS 0.1 09/30/2015 1500   EOSABS 0.1 11/22/2013 1120   BASOSABS 0.0 09/30/2015 1500   BASOSABS 0.0 11/22/2013 1120      Chemistry      Component Value Date/Time   NA 136 09/30/2015 1500   NA 139 11/22/2013 1120   NA 138 05/20/2012 1105   K 4.1 09/30/2015 1500   K 3.7 11/22/2013 1120   K 4.5 05/20/2012 1105   CL 107 09/30/2015 1500   CL 105 03/30/2013 1340   CL 100 05/20/2012 1105   CO2 22 09/30/2015 1500   CO2 24 11/22/2013 1120   CO2 27 05/20/2012 1105   BUN 15 09/30/2015 1500   BUN 9.6 11/22/2013 1120   BUN 15 05/20/2012 1105   CREATININE 1.22 09/30/2015 1500   CREATININE 0.9 11/22/2013 1120   CREATININE 1.0 05/20/2012 1105      Component Value Date/Time   CALCIUM 9.3 09/30/2015 1500   CALCIUM 9.7 11/22/2013 1120   CALCIUM 9.0 05/20/2012 1105   ALKPHOS 82 09/30/2015 1500   ALKPHOS 81 11/22/2013 1120   ALKPHOS 58 05/20/2012 1105   AST 35 09/30/2015  1500   AST 30 11/22/2013 1120   AST 30 05/20/2012 1105   ALT 48 09/30/2015 1500   ALT 34 11/22/2013 1120   ALT 52* 05/20/2012 1105   BILITOT 0.9 09/30/2015 1500   BILITOT 1.37* 11/22/2013 1120   BILITOT 0.90 05/20/2012 1105        PENDING LABS:   RADIOGRAPHIC STUDIES:  No results found.   PATHOLOGY:    ASSESSMENT AND PLAN:  Multiple myeloma (HCC) IgG kappa light chain multiple myeloma, Stage III (DS staging), S/P autologous stem cell transplant on 09/28/2013 at [facility].  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 He underwent evaluation with a bone marrow biopsy serum studies and PET imaging. BMBX had 2% plasma cells and PET was negative. He was treated with radiation only. He then went on to develop multiple plasmacytomas of the pancreas, rib and potentially liver. He received local radiation to the 7th rib on 03/28/2013. Repeat PET imaging showed multiple lytic myelomatous bone lesions involving the spine, left ribs and left iliac bone. Pancreatic biopsy was positive for plasma cells. He was started on RVD 03/2013 with cycle #3 given on 06/05/2013.   Problem listed updated as it was not previously updated.  Staging completed in [facility] problem list.  Labs today: CBC diff, CMET, LDH, ESR, CRP, SPEP+IFE, light chain assay, and B2M  Zometa has been discussed with the patient in the past.  I again brought it up today and he declines this intervention.    He wishes to continue follow-up here locally.  He was upset with my tardiness this afternoon.  I have recommend Ocean Spray PRN and a humidifier in the bedroom at home.  Return in 3-4 weeks for follow-up on epistaxis and follow-up.  Depending on lab results from today, further lab testing can be performed.  Addendum:  His  CBC is solid with a normal HGB and WBC.  His platelet count is chronically low dating back to his time of transplant.  Today,  his platelet count is one of his best over the past 1-2 years.  I called his cell phone personally with this news and his wife is informed of the information.  She will relay it to him.  Medically noncompliant Medical noncompliance with the following missed appointment at the [facility] since establishing his oncology care in May 2016: 03/29/2015, 04/03/2015, 04/25/2015.  Today is his first return since his appointment in May 2016.  Compliance education provided today.    THERAPY PLAN:  Continue surveillance.  All questions were answered. The patient knows to call the clinic with any problems, questions or concerns. We can certainly see the patient much sooner if necessary.  Patient and plan discussed with Dr. Ancil Linsey and she is in agreement with the aforementioned.   This note is electronically signed by: Doy Mince 09/30/2015 5:50 PM

## 2015-09-29 NOTE — Assessment & Plan Note (Addendum)
Medical noncompliance with the following missed appointment at the [facility] since establishing his oncology care in May 2016: 03/29/2015, 04/03/2015, 04/25/2015.  Today is his first return since his appointment in May 2016.  Compliance education provided today.

## 2015-09-30 ENCOUNTER — Other Ambulatory Visit (HOSPITAL_COMMUNITY): Payer: Self-pay | Admitting: Oncology

## 2015-09-30 ENCOUNTER — Encounter (HOSPITAL_COMMUNITY): Payer: Self-pay | Admitting: Oncology

## 2015-09-30 ENCOUNTER — Encounter (HOSPITAL_COMMUNITY): Payer: No Typology Code available for payment source | Attending: Oncology | Admitting: Oncology

## 2015-09-30 VITALS — BP 126/76 | HR 84 | Temp 97.6°F | Resp 18 | Wt 273.3 lb

## 2015-09-30 DIAGNOSIS — F419 Anxiety disorder, unspecified: Secondary | ICD-10-CM

## 2015-09-30 DIAGNOSIS — C9001 Multiple myeloma in remission: Secondary | ICD-10-CM | POA: Diagnosis not present

## 2015-09-30 DIAGNOSIS — F329 Major depressive disorder, single episode, unspecified: Secondary | ICD-10-CM

## 2015-09-30 DIAGNOSIS — Z91199 Patient's noncompliance with other medical treatment and regimen due to unspecified reason: Secondary | ICD-10-CM

## 2015-09-30 DIAGNOSIS — Z0000 Encounter for general adult medical examination without abnormal findings: Secondary | ICD-10-CM

## 2015-09-30 DIAGNOSIS — F32A Depression, unspecified: Secondary | ICD-10-CM

## 2015-09-30 DIAGNOSIS — C9000 Multiple myeloma not having achieved remission: Secondary | ICD-10-CM

## 2015-09-30 DIAGNOSIS — Z23 Encounter for immunization: Secondary | ICD-10-CM

## 2015-09-30 DIAGNOSIS — Z9119 Patient's noncompliance with other medical treatment and regimen: Secondary | ICD-10-CM | POA: Diagnosis not present

## 2015-09-30 LAB — CBC WITH DIFFERENTIAL/PLATELET
BASOS PCT: 0 %
Basophils Absolute: 0 10*3/uL (ref 0.0–0.1)
EOS ABS: 0.1 10*3/uL (ref 0.0–0.7)
Eosinophils Relative: 2 %
HCT: 39.8 % (ref 39.0–52.0)
Hemoglobin: 13.9 g/dL (ref 13.0–17.0)
LYMPHS PCT: 18 %
Lymphs Abs: 1 10*3/uL (ref 0.7–4.0)
MCH: 34.8 pg — ABNORMAL HIGH (ref 26.0–34.0)
MCHC: 34.9 g/dL (ref 30.0–36.0)
MCV: 99.5 fL (ref 78.0–100.0)
MONO ABS: 0.4 10*3/uL (ref 0.1–1.0)
Monocytes Relative: 7 %
NEUTROS ABS: 3.9 10*3/uL (ref 1.7–7.7)
NEUTROS PCT: 73 %
PLATELETS: 109 10*3/uL — AB (ref 150–400)
RBC: 4 MIL/uL — ABNORMAL LOW (ref 4.22–5.81)
RDW: 12.7 % (ref 11.5–15.5)
WBC: 5.4 10*3/uL (ref 4.0–10.5)

## 2015-09-30 LAB — COMPREHENSIVE METABOLIC PANEL
ALBUMIN: 4.6 g/dL (ref 3.5–5.0)
ALT: 48 U/L (ref 17–63)
ANION GAP: 7 (ref 5–15)
AST: 35 U/L (ref 15–41)
Alkaline Phosphatase: 82 U/L (ref 38–126)
BUN: 15 mg/dL (ref 6–20)
CHLORIDE: 107 mmol/L (ref 101–111)
CO2: 22 mmol/L (ref 22–32)
Calcium: 9.3 mg/dL (ref 8.9–10.3)
Creatinine, Ser: 1.22 mg/dL (ref 0.61–1.24)
GFR calc Af Amer: >60 mL/min (ref >60)
GFR calc non Af Amer: >60 mL/min (ref >60)
GLUCOSE: 87 mg/dL (ref 65–99)
POTASSIUM: 4.1 mmol/L (ref 3.5–5.1)
SODIUM: 136 mmol/L (ref 135–145)
TOTAL PROTEIN: 7.7 g/dL (ref 6.5–8.1)
Total Bilirubin: 0.9 mg/dL (ref 0.3–1.2)

## 2015-09-30 LAB — SEDIMENTATION RATE: Sed Rate: 38 mm/hr — ABNORMAL HIGH (ref 0–16)

## 2015-09-30 LAB — LACTATE DEHYDROGENASE: LDH: 170 U/L (ref 98–192)

## 2015-09-30 MED ORDER — INFLUENZA VAC SPLIT QUAD 0.5 ML IM SUSY
0.5000 mL | PREFILLED_SYRINGE | Freq: Once | INTRAMUSCULAR | Status: AC
  Administered 2015-09-30: 0.5 mL via INTRAMUSCULAR
  Filled 2015-09-30: qty 0.5

## 2015-09-30 MED ORDER — LORAZEPAM 1 MG PO TABS: 1.0000 mg | ORAL_TABLET | Freq: Two times a day (BID) | ORAL | Status: DC

## 2015-09-30 NOTE — Patient Instructions (Signed)
[place] at [facility] Discharge Instructions  RECOMMENDATIONS MADE BY THE CONSULTANT AND ANY TEST RESULTS WILL BE SENT TO YOUR REFERRING PHYSICIAN.  Exam and discussion by Robynn Pane, PA-C Will check some labs today.  If anything of concern we will contact you. Refill for lorazepam You can use Ocean Spray nasal spray as needed.  Follow-up in 3 weeks with Dr. Whitney Muse.  Thank you for choosing [place] at [facility] to provide your oncology and hematology care.  To afford each patient quality time with our provider, please arrive at least 15 minutes before your scheduled appointment time.    You need to re-schedule your appointment should you arrive 10 or more minutes late.  We strive to give you quality time with our providers, and arriving late affects you and other patients whose appointments are after yours.  Also, if you no show three or more times for appointments you may be dismissed from the clinic at the providers discretion.     Again, thank you for choosing [facility].  Our hope is that these requests will decrease the amount of time that you wait before being seen by our physicians.       _____________________________________________________________  Should you have questions after your visit to [facility], please contact our office at [phone] between the hours of 8:30 a.m. and 4:30 p.m.  Voicemails left after 4:30 p.m. will not be returned until the following business day.  For prescription refill requests, have your pharmacy contact our office.

## 2015-09-30 NOTE — Progress Notes (Signed)
Leone Payor presents today for injection per MD orders. Flu Vaccine administered IM in left deltoid. Administration without incident. Patient tolerated well.

## 2015-10-01 LAB — PROTEIN ELECTROPHORESIS, SERUM
A/G Ratio: 1.2 (ref 0.7–1.7)
ALBUMIN ELP: 3.8 g/dL (ref 2.9–4.4)
ALPHA-1-GLOBULIN: 0.2 g/dL (ref 0.0–0.4)
Alpha-2-Globulin: 0.6 g/dL (ref 0.4–1.0)
BETA GLOBULIN: 1.3 g/dL (ref 0.7–1.3)
GAMMA GLOBULIN: 1 g/dL (ref 0.4–1.8)
GLOBULIN, TOTAL: 3.2 g/dL (ref 2.2–3.9)
TOTAL PROTEIN ELP: 7 g/dL (ref 6.0–8.5)

## 2015-10-01 LAB — IMMUNOFIXATION ELECTROPHORESIS
IGA: 355 mg/dL (ref 90–386)
IgG (Immunoglobin G), Serum: 767 mg/dL (ref 700–1600)
IgM, Serum: 81 mg/dL (ref 20–172)
Total Protein ELP: 6.8 g/dL (ref 6.0–8.5)

## 2015-10-01 LAB — IGG, IGA, IGM
IGA: 365 mg/dL (ref 90–386)
IGG (IMMUNOGLOBIN G), SERUM: 852 mg/dL (ref 700–1600)
IgM, Serum: 79 mg/dL (ref 20–172)

## 2015-10-01 LAB — BETA 2 MICROGLOBULIN, SERUM: Beta-2 Microglobulin: 2.2 mg/L (ref 0.6–2.4)

## 2015-10-01 LAB — KAPPA/LAMBDA LIGHT CHAINS
KAPPA FREE LGHT CHN: 25.67 mg/L — AB (ref 3.30–19.40)
Kappa, lambda light chain ratio: 1.05 (ref 0.26–1.65)
LAMDA FREE LIGHT CHAINS: 24.35 mg/L (ref 5.71–26.30)

## 2015-10-21 ENCOUNTER — Ambulatory Visit (HOSPITAL_COMMUNITY): Payer: No Typology Code available for payment source | Admitting: Hematology & Oncology

## 2015-10-21 NOTE — Progress Notes (Signed)
This encounter was created in error - please disregard.

## 2015-11-06 ENCOUNTER — Other Ambulatory Visit (HOSPITAL_COMMUNITY): Payer: Self-pay | Admitting: Oncology

## 2015-11-06 DIAGNOSIS — F32A Depression, unspecified: Secondary | ICD-10-CM

## 2015-11-06 DIAGNOSIS — F419 Anxiety disorder, unspecified: Secondary | ICD-10-CM

## 2015-11-06 DIAGNOSIS — F329 Major depressive disorder, single episode, unspecified: Secondary | ICD-10-CM

## 2015-11-06 MED ORDER — LORAZEPAM 1 MG PO TABS: 1.0000 mg | ORAL_TABLET | Freq: Two times a day (BID) | ORAL | Status: DC

## 2015-11-25 ENCOUNTER — Ambulatory Visit (HOSPITAL_COMMUNITY): Payer: No Typology Code available for payment source | Admitting: Hematology & Oncology

## 2015-11-25 NOTE — Progress Notes (Signed)
This encounter was created in error - please disregard.

## 2015-12-16 ENCOUNTER — Telehealth: Payer: Self-pay | Admitting: Hematology and Oncology

## 2015-12-16 ENCOUNTER — Telehealth: Payer: Self-pay | Admitting: *Deleted

## 2015-12-16 ENCOUNTER — Other Ambulatory Visit: Payer: Self-pay | Admitting: Hematology and Oncology

## 2015-12-16 DIAGNOSIS — C9001 Multiple myeloma in remission: Secondary | ICD-10-CM

## 2015-12-16 NOTE — Telephone Encounter (Signed)
lvm for pt regarding to march appt... °

## 2015-12-16 NOTE — Telephone Encounter (Signed)
LM for patient- Dr Alvy Bimler to see next week. NO refills until seen

## 2015-12-16 NOTE — Telephone Encounter (Signed)
Pt called requesting refill of Diamox and Acyclovir.   Saw MD at [facility] X 2, does not want to go back to [facility]. Is requesting an appt with Dr Alvy Bimler.

## 2015-12-16 NOTE — Telephone Encounter (Signed)
I placed POF to see him next week No refill until he is seen Also, please tell him scheduler will call

## 2015-12-17 ENCOUNTER — Telehealth (HOSPITAL_COMMUNITY): Payer: Self-pay | Admitting: *Deleted

## 2015-12-17 NOTE — Telephone Encounter (Signed)
Patient has no-showed for the last 2 appointments.  He is noncompliant with appointments.  He is now seeing Dr. Alvy Bimler.  Will defer to her regarding refills.  We will no longer fill any of his medications.    Robynn Pane, PA-C 12/17/2015 5:36 PM

## 2015-12-24 NOTE — Telephone Encounter (Signed)
Left messages x2 for patient to call us back.

## 2015-12-25 ENCOUNTER — Ambulatory Visit: Payer: No Typology Code available for payment source | Admitting: Hematology and Oncology

## 2015-12-25 ENCOUNTER — Other Ambulatory Visit: Payer: No Typology Code available for payment source

## 2015-12-25 ENCOUNTER — Telehealth: Payer: Self-pay | Admitting: Hematology and Oncology

## 2015-12-25 NOTE — Telephone Encounter (Signed)
pt called to cx did not want to r/s at this time....will call us back to r/s

## 2016-02-19 ENCOUNTER — Encounter (HOSPITAL_COMMUNITY): Payer: Self-pay | Admitting: *Deleted

## 2016-02-19 MED ORDER — ACYCLOVIR 400 MG PO TABS: 400.0000 mg | ORAL_TABLET | Freq: Two times a day (BID) | ORAL | Status: DC

## 2016-02-19 NOTE — Progress Notes (Unsigned)
Acyclovir 400 mg bid with 2 refills escribed to [facility], [place].

## 2016-05-18 ENCOUNTER — Other Ambulatory Visit (HOSPITAL_COMMUNITY): Payer: Self-pay | Admitting: Hematology & Oncology

## 2016-06-17 ENCOUNTER — Other Ambulatory Visit (HOSPITAL_COMMUNITY): Payer: Self-pay | Admitting: Hematology & Oncology

## 2016-08-16 ENCOUNTER — Other Ambulatory Visit (HOSPITAL_COMMUNITY): Payer: Self-pay | Admitting: Hematology & Oncology

## 2016-09-17 ENCOUNTER — Encounter (HOSPITAL_COMMUNITY): Payer: Self-pay | Admitting: Emergency Medicine

## 2016-09-17 ENCOUNTER — Emergency Department (HOSPITAL_COMMUNITY)
Admission: EM | Admit: 2016-09-17 | Discharge: 2016-09-17 | Disposition: A | Discharge status: Home / Self Care | Payer: No Typology Code available for payment source | Attending: Emergency Medicine | Admitting: Emergency Medicine

## 2016-09-17 DIAGNOSIS — Z791 Long term (current) use of non-steroidal anti-inflammatories (NSAID): Secondary | ICD-10-CM | POA: Diagnosis not present

## 2016-09-17 DIAGNOSIS — F419 Anxiety disorder, unspecified: Secondary | ICD-10-CM | POA: Insufficient documentation

## 2016-09-17 DIAGNOSIS — Z85830 Personal history of malignant neoplasm of bone: Secondary | ICD-10-CM | POA: Diagnosis not present

## 2016-09-17 DIAGNOSIS — Z760 Encounter for issue of repeat prescription: Secondary | ICD-10-CM | POA: Diagnosis not present

## 2016-09-17 DIAGNOSIS — Z79899 Other long term (current) drug therapy: Secondary | ICD-10-CM | POA: Insufficient documentation

## 2016-09-17 DIAGNOSIS — I10 Essential (primary) hypertension: Secondary | ICD-10-CM | POA: Diagnosis not present

## 2016-09-17 LAB — I-STAT CHEM 8, ED
BUN: 13 mg/dL (ref 6–20)
CALCIUM ION: 1.19 mmol/L (ref 1.15–1.40)
Chloride: 102 mmol/L (ref 101–111)
Creatinine, Ser: 1.2 mg/dL (ref 0.61–1.24)
Glucose, Bld: 116 mg/dL — ABNORMAL HIGH (ref 65–99)
HEMATOCRIT: 36 % — AB (ref 39.0–52.0)
HEMOGLOBIN: 12.2 g/dL — AB (ref 13.0–17.0)
Potassium: 3.8 mmol/L (ref 3.5–5.1)
SODIUM: 137 mmol/L (ref 135–145)
TCO2: 23 mmol/L (ref 0–100)

## 2016-09-17 LAB — I-STAT TROPONIN, ED: Troponin i, poc: 0 ng/mL (ref 0.00–0.08)

## 2016-09-17 MED ORDER — OLMESARTAN MEDOXOMIL 20 MG PO TABS: 10.0000 mg | ORAL_TABLET | Freq: Every day | ORAL | 0 refills | Status: DC

## 2016-09-17 MED ORDER — LORAZEPAM 1 MG PO TABS
1.0000 mg | ORAL_TABLET | Freq: Once | ORAL | Status: AC
  Administered 2016-09-17: 1 mg via ORAL
  Filled 2016-09-17: qty 1

## 2016-09-17 MED ORDER — LORAZEPAM 1 MG PO TABS: 1.0000 mg | ORAL_TABLET | Freq: Two times a day (BID) | ORAL | 0 refills | Status: AC | PRN

## 2016-09-17 MED ORDER — IRBESARTAN 75 MG PO TABS
75.0000 mg | ORAL_TABLET | Freq: Once | ORAL | Status: DC
  Filled 2016-09-17: qty 1

## 2016-09-17 NOTE — ED Provider Notes (Signed)
[facility] DEPT Provider Note   CSN: [id number] Arrival date & time: 09/17/16  4034     History   Chief Complaint Chief Complaint  Patient presents with  . Medication Refill    HPI Austin Griffith is a 55 y.o. male presenting for evaluation of anxiety.  He reports ran out of his ativan yesterday and spent last night with insomnia, increasing anxiety and chest pressure which he states is typical of his anxiety attacks.  He denies chest pain, shortness of breath, nausea or vomiting but does endorse blurred vision so he knows his bp is elevated.  He takes benicar 10 mg prn "when his blood pressure is elevated" but ran out of this medicine about 2 weeks ago.  He tried to see his doctor as a walk in today but was sent here due to his symptoms.  He is confident his sx will resolve with an ativan tablet.  The history is provided by the patient.    Past Medical History:  Diagnosis Date  . Acid reflux   . Anxiety 07/05/2013  . AVN (avascular necrosis of bone) (HCC)    bilateral hips; s/p hip replacement in 1991 and 2009  . Blurred vision    Present for "many years"  . Bone metastases ([place]) 03/31/13    MR Abdomen -Lower Thoracic and Upper Lumbar spine  . Depression 11/22/2013  . Epistaxis 02/08/2012  . Extramedullary plasmacytoma in relapse ([place]) 04/10/2013  . Fatigue   . Fatigue 05/25/12   "Mild"  . Headache(784.0) 02/08/2012   Right Sided Headache Behind Right Eye  . History of radiation therapy 02/29/12-04/08/12   right  ethmoid sinus in 50.4Gy in 20 fxs  . Hyperlipidemia    diet control   . Hypertension   . Knee pain 10/18/2013  . Liver lesion 03/31/13   MR Abdomen  . Medically noncompliant 09/29/2015  . Multiple myeloma in remission ([place]) 11/22/2013  . Multiple myeloma, in relapse 07/05/2013  . Nausea alone 09/12/2013  . Neutropenic fever ([place]) 09/18/2013  . On antineoplastic chemotherapy started 04/08/13   Revlimid/ Velcade/ Dex  . Pancreatic lesion 03/31/13    MR Abdomen -  Ucinate Process  . Plasmacytoma ([place])     Right Ethmoid Sinus; bone marrow biopsy on 03/10/13 showed normal myeloma FISH panel and cytogenetics.   . S/P radiation therapy 03/28/2013   Left posterior 7th Rib / 8Gy in 1 fraction  . Shingles Feb 24, 2015  . Thrombocytopenia ([place]) 05/25/12    Patient Active Problem List   Diagnosis Date Noted  . Medically noncompliant 09/29/2015  . Depression 11/22/2013  . History of peripheral stem cell transplant ([place]) 09/29/2013  . Multiple myeloma in remission ([place]) 07/05/2013  . Anxiety 07/05/2013  . Adrenal insufficiency ([place]) 07/22/2012  . Hypertension     Past Surgical History:  Procedure Laterality Date  . Bilateral hip replacement  1991; and 2009   due to bilateral hip AVN's. / reports left hip was not replaced  . EUS N/A 03/24/2013   Procedure: UPPER ENDOSCOPIC ULTRASOUND (EUS) LINEAR;  Surgeon: Beryle Beams, MD;  Location: [facility];  Service: Endoscopy;  Laterality: N/A;  . FINE NEEDLE ASPIRATION N/A 03/24/2013   Procedure: FINE NEEDLE ASPIRATION (FNA) LINEAR;  Surgeon: Beryle Beams, MD;  Location: [facility];  Service: Endoscopy;  Laterality: N/A;  . MASS BIOPSY  01/26/12   Right Ethmoid Mass - Plasma Cell Neoplasm       Home Medications    Prior to Admission  medications   Medication Sig Start Date End Date Taking? Authorizing Provider  acetaZOLAMIDE (DIAMOX) 250 MG tablet Take 2 tablets by mouth 2 (two) times daily. 02/11/15   Historical Provider, MD  acyclovir (ZOVIRAX) 400 MG tablet take 1 tablet by mouth twice a day 08/16/16   Patrici Ranks, MD  escitalopram (LEXAPRO) 20 MG tablet Take 1 tablet (20 mg total) by mouth daily. 11/22/13   Heath Lark, MD  ibuprofen (ADVIL,MOTRIN) 200 MG tablet Take 200 mg by mouth every 6 (six) hours as needed.    Historical Provider, MD  lansoprazole (PREVACID) 15 MG capsule Take 15 mg by mouth daily.    Historical Provider, MD  loratadine (CLARITIN) 10 MG tablet Take 10 mg by mouth daily.     Historical Provider, MD  LORazepam (ATIVAN) 1 MG tablet Take 1 tablet (1 mg total) by mouth 2 (two) times daily as needed for anxiety. 09/17/16   Evalee Jefferson, PA-C  nabumetone (RELAFEN) 750 MG tablet Take 750 mg by mouth 2 (two) times daily. Reported on 09/30/2015 02/14/15   Historical Provider, MD  olmesartan (BENICAR) 20 MG tablet Take 0.5 tablets (10 mg total) by mouth daily. 09/17/16   Evalee Jefferson, PA-C  ondansetron (ZOFRAN) 8 MG tablet Take 1 tablet (8 mg total) by mouth every 8 (eight) hours as needed for nausea. 11/06/13   Heath Lark, MD    Family History Family History  Problem Relation Age of Onset  . Adopted: Yes    Social History Social History  Substance Use Topics  . Smoking status: Never Smoker  . Smokeless tobacco: Never Used  . Alcohol use Yes     Comment: Hx of Alcohol Abuse.09/18/13 denies but says drinks etoh     Allergies   Patient has no known allergies.   Review of Systems Review of Systems   Physical Exam Updated Vital Signs BP 128/85   Pulse 69   Temp 97.9 F (36.6 C)   Resp 24   Ht 6' 5" (1.956 m)   Wt 122.5 kg   SpO2 98%   BMI 32.02 kg/m   Physical Exam   ED Treatments / Results  Labs (all labs ordered are listed, but only abnormal results are displayed) Labs Reviewed  I-STAT CHEM 8, ED - Abnormal; Notable for the following:       Result Value   Glucose, Bld 116 (*)    Hemoglobin 12.2 (*)    HCT 36.0 (*)    All other components within normal limits  I-STAT TROPOININ, ED    EKG  EKG Interpretation  Date/Time:  Thursday September 17 2016 08:58:48 EST Ventricular Rate:  70 PR Interval:    QRS Duration: 100 QT Interval:  398 QTC Calculation: 430 R Axis:   57 Text Interpretation:  Sinus rhythm Confirmed by Wilson Singer  MD, STEPHEN ([id number]) on 09/17/2016 9:20:00 AM       Radiology No results found.  Procedures Procedures (including critical care time)  Medications Ordered in ED Medications  LORazepam (ATIVAN) tablet 1 mg (1 mg Oral  Given 09/17/16 0857)     Initial Impression / Assessment and Plan / ED Course  I have reviewed the triage vital signs and the nursing notes.  Pertinent labs & imaging results that were available during my care of the patient were reviewed by me and considered in my medical decision making (see chart for details).  Clinical Course     Labs and ekg reviewed and normal. Pt felt improved after receiving  ativan, bp also much improved so irbesartan (benicar substitute) not given.  Refills given for short course - pt advised he needs to f/u with his pcp for further medication management. Pt understands and will make appt today.  Final Clinical Impressions(s) / ED Diagnoses   Final diagnoses:  Anxiety  Encounter for medication refill    New Prescriptions New Prescriptions   LORAZEPAM (ATIVAN) 1 MG TABLET    Take 1 tablet (1 mg total) by mouth 2 (two) times daily as needed for anxiety.   OLMESARTAN (BENICAR) 20 MG TABLET    Take 0.5 tablets (10 mg total) by mouth daily.     Evalee Jefferson, PA-C 09/17/16 0076    Virgel Manifold, MD 09/17/16 [phone]

## 2016-09-17 NOTE — ED Triage Notes (Signed)
Pt states he ran out of Ativan yesterday and is very anxious.  Has been out of bp meds x 2 weeks.  Has appt with pcp this afternoon.

## 2016-10-06 ENCOUNTER — Telehealth: Payer: Self-pay | Admitting: Hematology and Oncology

## 2016-10-06 ENCOUNTER — Encounter: Payer: Self-pay | Admitting: Hematology and Oncology

## 2016-10-06 NOTE — Telephone Encounter (Signed)
Appt scheduled w/Dr. Alvy Bimler on 10/30/16 at 2pm. Pt agreed to the appt date and time. Demographics verified. Letter mailed.

## 2016-10-15 ENCOUNTER — Other Ambulatory Visit (HOSPITAL_COMMUNITY): Payer: Self-pay | Admitting: Hematology & Oncology

## 2016-10-30 ENCOUNTER — Telehealth: Payer: Self-pay | Admitting: *Deleted

## 2016-10-30 ENCOUNTER — Encounter: Payer: Self-pay | Admitting: Hematology and Oncology

## 2016-10-30 ENCOUNTER — Other Ambulatory Visit: Payer: Self-pay | Admitting: Hematology and Oncology

## 2016-10-30 ENCOUNTER — Telehealth: Payer: Self-pay | Admitting: Hematology and Oncology

## 2016-10-30 ENCOUNTER — Ambulatory Visit (HOSPITAL_BASED_OUTPATIENT_CLINIC_OR_DEPARTMENT_OTHER): Payer: No Typology Code available for payment source

## 2016-10-30 ENCOUNTER — Ambulatory Visit (HOSPITAL_BASED_OUTPATIENT_CLINIC_OR_DEPARTMENT_OTHER): Payer: No Typology Code available for payment source | Admitting: Hematology and Oncology

## 2016-10-30 VITALS — BP 139/83 | HR 70 | Temp 97.3°F | Resp 29 | Wt 276.0 lb

## 2016-10-30 DIAGNOSIS — C9001 Multiple myeloma in remission: Secondary | ICD-10-CM

## 2016-10-30 DIAGNOSIS — Z9484 Stem cells transplant status: Secondary | ICD-10-CM

## 2016-10-30 DIAGNOSIS — R310 Gross hematuria: Secondary | ICD-10-CM

## 2016-10-30 DIAGNOSIS — Z85828 Personal history of other malignant neoplasm of skin: Secondary | ICD-10-CM | POA: Diagnosis not present

## 2016-10-30 LAB — COMPREHENSIVE METABOLIC PANEL
ALT: 38 U/L (ref 0–55)
ANION GAP: 8 meq/L (ref 3–11)
AST: 28 U/L (ref 5–34)
Albumin: 3.7 g/dL (ref 3.5–5.0)
Alkaline Phosphatase: 79 U/L (ref 40–150)
BILIRUBIN TOTAL: 0.86 mg/dL (ref 0.20–1.20)
BUN: 9.6 mg/dL (ref 7.0–26.0)
CALCIUM: 9.1 mg/dL (ref 8.4–10.4)
CHLORIDE: 106 meq/L (ref 98–109)
CO2: 24 meq/L (ref 22–29)
CREATININE: 1.1 mg/dL (ref 0.7–1.3)
EGFR: 75 mL/min/{1.73_m2} — AB (ref >90)
Glucose: 107 mg/dl (ref 70–140)
Potassium: 3.8 mEq/L (ref 3.5–5.1)
Sodium: 137 mEq/L (ref 136–145)
Total Protein: 6.4 g/dL (ref 6.4–8.3)

## 2016-10-30 LAB — URINALYSIS, MICROSCOPIC - CHCC
Bilirubin (Urine): NEGATIVE
GLUCOSE UR CHCC: NEGATIVE mg/dL
Ketones: NEGATIVE mg/dL
LEUKOCYTE ESTERASE: NEGATIVE
NITRITE: NEGATIVE
PROTEIN: 30 mg/dL
SPECIFIC GRAVITY, URINE: 1.03 (ref 1.003–1.035)
UROBILINOGEN UR: 0.2 mg/dL (ref 0.2–1)
pH: 6 (ref 4.6–8.0)

## 2016-10-30 LAB — CBC WITH DIFFERENTIAL/PLATELET
BASO%: 0.3 % (ref 0.0–2.0)
BASOS ABS: 0 10*3/uL (ref 0.0–0.1)
EOS ABS: 0.1 10*3/uL (ref 0.0–0.5)
EOS%: 2.4 % (ref 0.0–7.0)
HEMATOCRIT: 33.4 % — AB (ref 38.4–49.9)
HGB: 11.9 g/dL — ABNORMAL LOW (ref 13.0–17.1)
LYMPH#: 0.8 10*3/uL — AB (ref 0.9–3.3)
LYMPH%: 20.5 % (ref 14.0–49.0)
MCH: 34 pg — ABNORMAL HIGH (ref 27.2–33.4)
MCHC: 35.6 g/dL (ref 32.0–36.0)
MCV: 95.4 fL (ref 79.3–98.0)
MONO#: 0.4 10*3/uL (ref 0.1–0.9)
MONO%: 9.4 % (ref 0.0–14.0)
NEUT#: 2.5 10*3/uL (ref 1.5–6.5)
NEUT%: 67.4 % (ref 39.0–75.0)
PLATELETS: 83 10*3/uL — AB (ref 140–400)
RBC: 3.5 10*6/uL — AB (ref 4.20–5.82)
RDW: 12.5 % (ref 11.0–14.6)
WBC: 3.7 10*3/uL — ABNORMAL LOW (ref 4.0–10.3)

## 2016-10-30 NOTE — Telephone Encounter (Signed)
-----   Message from Heath Lark, MD sent at 10/30/2016  3:29 PM EST ----- Regarding: hematuria Urine test showed a lot of blood I will order KUB X ray (tell him to stop by [facility] hospital next week for the test) to exclude kidney stone. If xray is negative I will refer him to urologist ----- Message ----- From: Interface, Lab In Three Zero One Sent: 10/30/2016   2:55 PM To: Heath Lark, MD

## 2016-10-30 NOTE — Progress Notes (Signed)
[facility] OFFICE PROGRESS NOTE  Patient Care Team: Sharilyn Sites, MD as PCP - General (Family Medicine) Jeanann Lewandowsky, MD as PCP - Hematology/Oncology (Hematology and Oncology) Melissa Montane, MD as Attending Physician (Otolaryngology) Eppie Gibson, MD (Radiation Oncology) Elayne Snare, MD as Attending Physician (Endocrinology) Carol Ada, MD as Attending Physician (Gastroenterology) Heath Lark, MD as Consulting Physician (Hematology and Oncology)  SUMMARY OF ONCOLOGIC HISTORY: Oncology History   IgG Kappa light chain disease, Durie-Salmon stage III     Multiple myeloma in remission ([place])   01/19/2012 Imaging    The patient has CT scan of the head for evaluation of severe headaches which show abnormalities in the sinus      01/22/2012 Bone Marrow Biopsy    Bone marrow biopsy show only 2% plasma cell      01/22/2012 Imaging    CT scan of the sinus showed Ethmoid mucocele with some expansion of the air cells and soft tissue extending into the upper nasal cavity.       01/26/2012 Procedure    Biopsy of the sinus mass confirmed plasmacytoma      02/11/2012 Imaging    PET scan showed Expansile soft tissue in the right ethmoid sinus and sphenoid sinuses has an S U V max of 24.6.  No additional areas of abnormalities elsewhere.        05/20/2012 Imaging    Repeat CT scan of the sinuses show complete response to treatment      03/03/2013 Imaging    CT scan of the chest showed fracture of the left seventh rib represents a pathologic fracture.  There is a destructive soft tissue mass at the site of the fracture worrisome for malignancy.       03/10/2013 Bone Marrow Biopsy    Repeat bone marrow biopsy again initial 2% plasma cell      03/17/2013 Imaging    Repeat PET scan showed numerous lytic myelomatous bone lesions involving the spine, left ribs and left iliac bone. There were also two pancreatic lesions and possibly three liver lesions suspicious for metastatic  disease.        03/21/2013 Procedure    Biopsy of the rib lesion came back plasmacytoma      03/24/2013 Procedure    Fine-needle aspirate and biopsy of the pancreatic mass confirmed plasma cell       06/09/2013 - 08/09/2013 Chemotherapy    The patient completed 4 months of induction chemotherapy with Revlimid, Velcade and dexamethasone along with Zometa      08/21/2013 Imaging    Re[facility] at [facility] show persistent lytic lesions but there were no abnormalities in the pancreas or liver      09/28/2013 Bone Marrow Transplant    The patient underwent autologous stem cell rescue after conditioning therapy with melphalan      02/26/2015 Miscellaneous    Established oncology care at  [facility]      02/26/2015 Miscellaneous    Non-complicance with appointments: No show on: 03/29/2015, 04/03/2015, 04/25/2015.       INTERVAL HISTORY: Please see below for problem oriented charting. He returns for further follow-up. He is referred back to [facility] by his PCP due to significant noncompliance with his cancer care in another facility I reviewed his CBC from 09/23/2016 He is mildly anemic with hemoglobin of 12.8. He had mildly elevated AST and ALT and high cholesterol. Since I saw him, he was divorced. He feels well. 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 No recent bone pain He was seen by dermatologist recently and had skin biopsy. He was told he had cancer but subtype is unknown He is healing well from recent surgery 2 days ago, he had one episode of gross hematuria, resolved He denies dysuria, frequency or urgency  REVIEW OF SYSTEMS:   Constitutional: Denies fevers, chills or abnormal weight loss Eyes: Denies blurriness of vision Ears, nose, mouth, throat, and face: Denies mucositis or sore throat Respiratory: Denies cough, dyspnea or wheezes Cardiovascular: Denies palpitation, chest discomfort or lower extremity  swelling Gastrointestinal:  Denies nausea, heartburn or change in bowel habits Lymphatics: Denies new lymphadenopathy or easy bruising Neurological:Denies numbness, tingling or new weaknesses Behavioral/Psych: Mood is stable, no new changes  All other systems were reviewed with the patient and are negative.  I have reviewed the past medical history, past surgical history, social history and family history with the patient and they are unchanged from previous note.  ALLERGIES:  has No Known Allergies.  MEDICATIONS:  Current Outpatient Prescriptions  Medication Sig Dispense Refill  . acyclovir (ZOVIRAX) 400 MG tablet take 1 tablet by mouth twice a day 60 tablet 1  . escitalopram (LEXAPRO) 20 MG tablet Take 1 tablet (20 mg total) by mouth daily. 90 tablet 3  . ibuprofen (ADVIL,MOTRIN) 200 MG tablet Take 200 mg by mouth every 6 (six) hours as needed.    . lansoprazole (PREVACID) 15 MG capsule Take 15 mg by mouth daily.    Marland Kitchen LORazepam (ATIVAN) 1 MG tablet Take 1 tablet (1 mg total) by mouth 2 (two) times daily as needed for anxiety. 20 tablet 0  . olmesartan (BENICAR) 20 MG tablet Take 0.5 tablets (10 mg total) by mouth daily. 15 tablet 0  . vitamin B-12 (CYANOCOBALAMIN) 1000 MCG tablet Take by mouth.    . loratadine (CLARITIN) 10 MG tablet Take 10 mg by mouth daily.    . ondansetron (ZOFRAN) 8 MG tablet Take 1 tablet (8 mg total) by mouth every 8 (eight) hours as needed for nausea. (Patient not taking: Reported on 10/30/2016) 60 tablet 2   No current facility-administered medications for this visit.     PHYSICAL EXAMINATION: ECOG PERFORMANCE STATUS: 1 - Symptomatic but completely ambulatory  Vitals:   10/30/16 1351  BP: 139/83  Pulse: 70  Resp: (!) 29  Temp: 97.3 F (36.3 C)   Filed Weights   10/30/16 1351  Weight: 276 lb (125.2 kg)    GENERAL:alert, no distress and comfortable SKIN: skin color, texture, turgor are normal, no rashes or significant lesions. No suspicious skin  lesion EYES: normal, Conjunctiva are pink and non-injected, sclera clear OROPHARYNX:no exudate, no erythema and lips, buccal mucosa, and tongue normal  NECK: supple, thyroid normal size, non-tender, without nodularity LYMPH:  no palpable lymphadenopathy in the cervical, axillary or inguinal LUNGS: clear to auscultation and percussion with normal breathing effort HEART: regular rate & rhythm and no murmurs and no lower extremity edema ABDOMEN:abdomen soft, non-tender and normal bowel sounds Musculoskeletal:no cyanosis of digits and no clubbing  NEURO: alert & oriented x 3 with fluent speech, no focal motor/sensory deficits  LABORATORY DATA:  I have reviewed the data as listed    Component Value Date/Time   NA 137 09/17/2016 0904   NA 139 11/22/2013 1120   K 3.8 09/17/2016 0904   K 3.7 11/22/2013 1120   CL 102 09/17/2016 0904   CL 105 03/30/2013 1340   CO2 22 09/30/2015 1500  CO2 24 11/22/2013 1120   GLUCOSE 116 (H) 09/17/2016 0904   GLUCOSE 116 11/22/2013 1120   GLUCOSE 120 (H) 03/30/2013 1340   BUN 13 09/17/2016 0904   BUN 9.6 11/22/2013 1120   CREATININE 1.20 09/17/2016 0904   CREATININE 0.9 11/22/2013 1120   CALCIUM 9.3 09/30/2015 1500   CALCIUM 9.7 11/22/2013 1120   PROT 7.7 09/30/2015 1500   PROT 6.7 11/22/2013 1120   ALBUMIN 4.6 09/30/2015 1500   ALBUMIN 3.7 11/22/2013 1120   AST 35 09/30/2015 1500   AST 30 11/22/2013 1120   ALT 48 09/30/2015 1500   ALT 34 11/22/2013 1120   ALKPHOS 82 09/30/2015 1500   ALKPHOS 81 11/22/2013 1120   BILITOT 0.9 09/30/2015 1500   BILITOT 1.37 (H) 11/22/2013 1120   GFRNONAA >60 09/30/2015 1500   GFRAA >60 09/30/2015 1500    No results found for: SPEP, UPEP  Lab Results  Component Value Date   WBC 5.4 09/30/2015   NEUTROABS 3.9 09/30/2015   HGB 12.2 (L) 09/17/2016   HCT 36.0 (L) 09/17/2016   MCV 99.5 09/30/2015   PLT 109 (L) 09/30/2015      Chemistry      Component Value Date/Time   NA 137 09/17/2016 0904   NA 139  11/22/2013 1120   K 3.8 09/17/2016 0904   K 3.7 11/22/2013 1120   CL 102 09/17/2016 0904   CL 105 03/30/2013 1340   CO2 22 09/30/2015 1500   CO2 24 11/22/2013 1120   BUN 13 09/17/2016 0904   BUN 9.6 11/22/2013 1120   CREATININE 1.20 09/17/2016 0904   CREATININE 0.9 11/22/2013 1120      Component Value Date/Time   CALCIUM 9.3 09/30/2015 1500   CALCIUM 9.7 11/22/2013 1120   ALKPHOS 82 09/30/2015 1500   ALKPHOS 81 11/22/2013 1120   AST 35 09/30/2015 1500   AST 30 11/22/2013 1120   ALT 48 09/30/2015 1500   ALT 34 11/22/2013 1120   BILITOT 0.9 09/30/2015 1500   BILITOT 1.37 (H) 11/22/2013 1120     ASSESSMENT & PLAN:  Multiple myeloma in remission ([place]) The patient was recently lost to follow-up He has not returned to [facility] for over a year I plan to repeat blood work today to assess myeloma status and will call him with test results Due to history of noncompliance, I plan to see him back again in 3 months with repeat history, physical examination and blood work a week ahead of time If his repeat blood work is stable, we can lengthen interval between visits He agreed with the plan of care  History of peripheral stem cell transplant ([place]) The patient is more than 2 years out from his prior bone marrow transplant He has completed all his vaccination program He will continue annual influenza vaccination  Hematuria, gross He had recent gross hematuria I will order urinalysis and urine culture today to exclude UTI  History of skin cancer He had recent diagnosis of skin cancer I do not have further information about this I have asked him to sign consent to release information to request a copy of his recent skin biopsy I will call the patient with further details If the patient had melanoma diagnosis, he needs to be seen back quickly for further staging and workup   Orders Placed This Encounter  Procedures  . Urine culture    Standing Status:   Future    Standing Expiration  Date:   12/04/2017  . Comprehensive metabolic panel  Standing Status:   Future    Standing Expiration Date:   12/04/2017  . CBC with Differential/Platelet    Standing Status:   Future    Standing Expiration Date:   12/04/2017  . Kappa/lambda light chains    Standing Status:   Future    Standing Expiration Date:   12/04/2017  . Multiple Myeloma Panel (SPEP&IFE w/QIG)    Standing Status:   Future    Standing Expiration Date:   12/04/2017  . Urinalysis, Microscopic - [facility]    Standing Status:   Future    Standing Expiration Date:   12/04/2017   All questions were answered. The patient knows to call the clinic with any problems, questions or concerns. No barriers to learning was detected. I spent 30 minutes counseling the patient face to face. The total time spent in the appointment was 40 minutes and more than 50% was on counseling and review of test results     Heath Lark, MD 10/30/2016 2:11 PM

## 2016-10-30 NOTE — Assessment & Plan Note (Signed)
The patient was recently lost to follow-up He has not returned to [facility] for over a year I plan to repeat blood work today to assess myeloma status and will call him with test results Due to history of noncompliance, I plan to see him back again in 3 months with repeat history, physical examination and blood work a week ahead of time If his repeat blood work is stable, we can lengthen interval between visits He agreed with the plan of care

## 2016-10-30 NOTE — Assessment & Plan Note (Signed)
The patient is more than 2 years out from his prior bone marrow transplant He has completed all his vaccination program He will continue annual influenza vaccination 

## 2016-10-30 NOTE — Telephone Encounter (Signed)
LM with Dr Alvy Bimler note. To call if has questions

## 2016-10-30 NOTE — Assessment & Plan Note (Signed)
He had recent diagnosis of skin cancer I do not have further information about this I have asked him to sign consent to release information to request a copy of his recent skin biopsy I will call the patient with further details If the patient had melanoma diagnosis, he needs to be seen back quickly for further staging and workup

## 2016-10-30 NOTE — Telephone Encounter (Signed)
Lab appointment added for today, per 10/30/16 los. Appointments scheduled per 10/30/16 los. Patient was given a copy of the AVS report and appointment schedule,per 10/30/16 los.

## 2016-10-30 NOTE — Assessment & Plan Note (Signed)
He had recent gross hematuria I will order urinalysis and urine culture today to exclude UTI

## 2016-10-31 LAB — URINE CULTURE: Organism ID, Bacteria: NO GROWTH

## 2016-11-02 ENCOUNTER — Telehealth: Payer: Self-pay | Admitting: *Deleted

## 2016-11-02 NOTE — Telephone Encounter (Signed)
-----   Message from Heath Lark, MD sent at 11/02/2016  8:34 AM EST ----- Urine test is negative Not sure if he got the message last week about Xray abdomen? ----- Message ----- From: Interface, Lab In Three Zero One Sent: 10/30/2016   2:55 PM To: Heath Lark, MD

## 2016-11-02 NOTE — Telephone Encounter (Signed)
Called patient to f/u from phone call last week. Patient to have KUB done @ [facility] this week. No answer. LVM. Asked patient to call back when he receives the message.

## 2016-11-03 ENCOUNTER — Telehealth: Payer: Self-pay

## 2016-11-03 ENCOUNTER — Ambulatory Visit (HOSPITAL_COMMUNITY)
Admission: RE | Admit: 2016-11-03 | Discharge: 2016-11-03 | Disposition: A | Discharge status: Home / Self Care | Payer: No Typology Code available for payment source | Source: Ambulatory Visit | Attending: Hematology and Oncology | Admitting: Hematology and Oncology

## 2016-11-03 DIAGNOSIS — R310 Gross hematuria: Secondary | ICD-10-CM

## 2016-11-03 DIAGNOSIS — N200 Calculus of kidney: Secondary | ICD-10-CM | POA: Diagnosis not present

## 2016-11-03 LAB — KAPPA/LAMBDA LIGHT CHAINS
IG KAPPA FREE LIGHT CHAIN: 18.1 mg/L (ref 3.3–19.4)
IG LAMBDA FREE LIGHT CHAIN: 24.1 mg/L (ref 5.7–26.3)
Kappa/Lambda FluidC Ratio: 0.75 (ref 0.26–1.65)

## 2016-11-03 NOTE — Telephone Encounter (Signed)
Informed patient of xray findings per Dr. Calton Dach request. Patient states he does not have a urologist. Will refer to urology for f/u.

## 2016-11-03 NOTE — Telephone Encounter (Signed)
-----   Message from Heath Lark, MD sent at 11/03/2016 12:15 PM EST ----- Regarding: Xray Can you call him and let him know Xray showed multiple kidney stones. This is likely the cause of his recent hematuria. Does he has a urologist? If not, please send request for urology consult ASAP Thanks ----- Message ----- From: Interface, Rad Results In Sent: 11/03/2016  12:06 PM To: Heath Lark, MD

## 2016-11-03 NOTE — Telephone Encounter (Signed)
Referral faxed to [facility] °

## 2016-11-04 LAB — MULTIPLE MYELOMA PANEL, SERUM
ALBUMIN/GLOB SERPL: 1.4 (ref 0.7–1.7)
ALPHA2 GLOB SERPL ELPH-MCNC: 0.6 g/dL (ref 0.4–1.0)
Albumin SerPl Elph-Mcnc: 3.5 g/dL (ref 2.9–4.4)
Alpha 1: 0.2 g/dL (ref 0.0–0.4)
B-GLOBULIN SERPL ELPH-MCNC: 1.1 g/dL (ref 0.7–1.3)
Gamma Glob SerPl Elph-Mcnc: 0.7 g/dL (ref 0.4–1.8)
Globulin, Total: 2.6 g/dL (ref 2.2–3.9)
IGM (IMMUNOGLOBIN M), SRM: 70 mg/dL (ref 20–172)
IgA, Qn, Serum: 311 mg/dL (ref 90–386)
IgG, Qn, Serum: 636 mg/dL — ABNORMAL LOW (ref 700–1600)
TOTAL PROTEIN: 6.1 g/dL (ref 6.0–8.5)

## 2016-11-06 ENCOUNTER — Telehealth: Payer: Self-pay | Admitting: *Deleted

## 2016-11-06 NOTE — Telephone Encounter (Signed)
Informed pt of Dr. Calton Dach message regarding his myeloma labs. He verbalized understanding.   Pt asked about Urology referral. He has not heard anything and he says "I have severe blood in my urine."  Questioned pt further, asking how often he sees blood in his urine, what color it is and if he is having any pain?  Pt replied that he does not have any pain and doesn't see blood but was told by Dr. Alvy Bimler he had "severe blood"  Informed pt I will call [facility] to f/u on Referral.  We faxed his records to them on 1/23.   Pt then asked if Dr. Alvy Bimler has received reports on his skin cancer.  He says Dermatologist wants to do more surgery but he doesn't think it is necessary.  Pt wants Dr. Calton Dach opinion.  He wants to know if she thinks he does not need to have more surgery.  Pt says he cannot tolerate having any more scars on his face.  I tried to clarify with pt what type of surgery he has already had.  If it was just a biopsy then I tried to explain the Dermatologist would need to go back and make sure they removed all the skin cancer now that they know it is cancer.   Pt says "I didn't know I was going through a whole phone exam here.  I am kind of busy."

## 2016-11-06 NOTE — Telephone Encounter (Signed)
-----   Message from Heath Lark, MD sent at 11/06/2016  5:30 AM EST ----- Regarding: myeloma labs PLs let him know myeloma labs showed he is still in remission

## 2016-11-12 ENCOUNTER — Telehealth: Payer: Self-pay | Admitting: *Deleted

## 2016-11-12 NOTE — Telephone Encounter (Signed)
Spoke with [facility]. They will call patient for an appt

## 2016-11-14 ENCOUNTER — Other Ambulatory Visit (HOSPITAL_COMMUNITY): Payer: Self-pay | Admitting: Hematology & Oncology

## 2016-11-23 ENCOUNTER — Telehealth: Payer: Self-pay | Admitting: *Deleted

## 2016-11-23 NOTE — Telephone Encounter (Signed)
RN left voicemail to call back [place]. Per MD Alvy Bimler, patient needs to be informed that skin surgery is necessary and to follow with dermatologist recommendations.

## 2016-12-08 ENCOUNTER — Emergency Department (HOSPITAL_COMMUNITY)
Admission: EM | Admit: 2016-12-08 | Discharge: 2016-12-08 | Disposition: A | Discharge status: Home / Self Care | Payer: No Typology Code available for payment source | Attending: Emergency Medicine | Admitting: Emergency Medicine

## 2016-12-08 ENCOUNTER — Encounter (HOSPITAL_COMMUNITY): Payer: Self-pay | Admitting: *Deleted

## 2016-12-08 DIAGNOSIS — Z79899 Other long term (current) drug therapy: Secondary | ICD-10-CM | POA: Insufficient documentation

## 2016-12-08 DIAGNOSIS — R5383 Other fatigue: Secondary | ICD-10-CM | POA: Diagnosis present

## 2016-12-08 DIAGNOSIS — I10 Essential (primary) hypertension: Secondary | ICD-10-CM | POA: Diagnosis not present

## 2016-12-08 DIAGNOSIS — Z791 Long term (current) use of non-steroidal anti-inflammatories (NSAID): Secondary | ICD-10-CM | POA: Insufficient documentation

## 2016-12-08 DIAGNOSIS — R040 Epistaxis: Secondary | ICD-10-CM

## 2016-12-08 LAB — URINALYSIS, ROUTINE W REFLEX MICROSCOPIC
BILIRUBIN URINE: NEGATIVE
Bacteria, UA: NONE SEEN
Glucose, UA: NEGATIVE mg/dL
Ketones, ur: NEGATIVE mg/dL
Leukocytes, UA: NEGATIVE
NITRITE: NEGATIVE
PROTEIN: 30 mg/dL — AB
Specific Gravity, Urine: 1.02 (ref 1.005–1.030)
pH: 5 (ref 5.0–8.0)

## 2016-12-08 LAB — CBC
HCT: 35.3 % — ABNORMAL LOW (ref 39.0–52.0)
Hemoglobin: 12.6 g/dL — ABNORMAL LOW (ref 13.0–17.0)
MCH: 34.4 pg — AB (ref 26.0–34.0)
MCHC: 35.7 g/dL (ref 30.0–36.0)
MCV: 96.4 fL (ref 78.0–100.0)
PLATELETS: 97 10*3/uL — AB (ref 150–400)
RBC: 3.66 MIL/uL — AB (ref 4.22–5.81)
RDW: 12.4 % (ref 11.5–15.5)
WBC: 4.4 10*3/uL (ref 4.0–10.5)

## 2016-12-08 LAB — COMPREHENSIVE METABOLIC PANEL
ALBUMIN: 4.1 g/dL (ref 3.5–5.0)
ALT: 37 U/L (ref 17–63)
AST: 34 U/L (ref 15–41)
Alkaline Phosphatase: 65 U/L (ref 38–126)
Anion gap: 6 (ref 5–15)
BUN: 12 mg/dL (ref 6–20)
CHLORIDE: 103 mmol/L (ref 101–111)
CO2: 28 mmol/L (ref 22–32)
CREATININE: 1.03 mg/dL (ref 0.61–1.24)
Calcium: 9.2 mg/dL (ref 8.9–10.3)
GFR calc Af Amer: >60 mL/min (ref >60)
GFR calc non Af Amer: >60 mL/min (ref >60)
GLUCOSE: 94 mg/dL (ref 65–99)
Potassium: 3.9 mmol/L (ref 3.5–5.1)
SODIUM: 137 mmol/L (ref 135–145)
Total Bilirubin: 0.7 mg/dL (ref 0.3–1.2)
Total Protein: 7 g/dL (ref 6.5–8.1)

## 2016-12-08 LAB — TYPE AND SCREEN
ABO/RH(D): A POS
Antibody Screen: NEGATIVE

## 2016-12-08 MED ORDER — SODIUM CHLORIDE 0.9 % IV BOLUS (SEPSIS)
1000.0000 mL | Freq: Once | INTRAVENOUS | Status: AC
  Administered 2016-12-08: 1000 mL via INTRAVENOUS

## 2016-12-08 NOTE — Discharge Instructions (Signed)

## 2016-12-08 NOTE — ED Notes (Signed)
At completion of triage nurse made patient aware the we didn't have any beds at the moment but rooms were being cleaned. Pt was going to be placed in room behind triage until a room was finished being cleaned. Pt then stated that he "needed a bed now." I offered patient a wheel chair until a bed opened up but patient refused. Pt stated to this nurse that he wanted to leave since we didn't have any beds. Because patient has had dizziness pt was asked how he would walk out of here and get home. Pt states, "I will find my own ways. 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 Pt agreed to stay. Pt placed in room behind triage for less than 3 minutes and then taken to a room.

## 2016-12-08 NOTE — ED Provider Notes (Signed)
Emergency Department Provider Note   I have reviewed the triage vital signs and the nursing notes.   HISTORY  Chief Complaint Hematemesis   HPI Austin Griffith is a 56 y.o. male with PMH of acid reflux, epistaxis, fatigue, HLD, and MM not on active chemotherapy or radiation presents to the emergency department for evaluation of vomiting blood and fatigue. Patient states that 2 nights ago he woke up and noticed some drainage from his nose. Internal light and saw blood. Shortly after that he had nausea with sensation of something running down the back of his throat and subsequently had vomiting of bright red blood. His symptoms have not returned since this incident 2 nights ago but over the past 2 days he's felt increasingly fatigued and generally weak. He notes some intermittent mild headache and lightheadedness when standing or bending over. He denies any chest pain, difficulty breathing, palpitations. No abdominal pain. He has not noticed any black or grossly bloody bowel movements. No fever or chills. No history of vomiting blood. Patient states she is concerned because he was initially diagnosed with also myeloma he had similar fatigue symptoms.   He also reports an episode of hematuria that was diagnosed approximately 2 weeks ago. Patient apparently had a CT scan which diagnosed any stone. He states that they did perform cystoscopy to evaluate his bladder that was normal. He has follow-up with urology in April. Denies seeing any gross blood in his urine. No flank pain.   Patient's fatigue symptoms are mild, constant, with no modifying factors.    Past Medical History:  Diagnosis Date  . Acid reflux   . Anxiety 07/05/2013  . AVN (avascular necrosis of bone) (HCC)    bilateral hips; s/p hip replacement in 1991 and 2009  . Blurred vision    Present for "many years"  . Bone metastases ([place]) 03/31/13    MR Abdomen -Lower Thoracic and Upper Lumbar spine  . Depression 11/22/2013  .  Epistaxis 02/08/2012  . Extramedullary plasmacytoma in relapse ([place]) 04/10/2013  . Fatigue   . Fatigue 05/25/12   "Mild"  . Headache(784.0) 02/08/2012   Right Sided Headache Behind Right Eye  . History of radiation therapy 02/29/12-04/08/12   right  ethmoid sinus in 50.4Gy in 20 fxs  . Hyperlipidemia    diet control   . Hypertension   . Knee pain 10/18/2013  . Liver lesion 03/31/13   MR Abdomen  . Medically noncompliant 09/29/2015  . Multiple myeloma in remission ([place]) 11/22/2013  . Multiple myeloma, in relapse 07/05/2013  . Nausea alone 09/12/2013  . Neutropenic fever ([place]) 09/18/2013  . On antineoplastic chemotherapy started 04/08/13   Revlimid/ Velcade/ Dex  . Pancreatic lesion 03/31/13    MR Abdomen - Ucinate Process  . Plasmacytoma ([place])     Right Ethmoid Sinus; bone marrow biopsy on 03/10/13 showed normal myeloma FISH panel and cytogenetics.   . S/P radiation therapy 03/28/2013   Left posterior 7th Rib / 8Gy in 1 fraction  . Shingles Feb 24, 2015  . Thrombocytopenia ([place]) 05/25/12    Patient Active Problem List   Diagnosis Date Noted  . Hematuria, gross 10/30/2016  . History of skin cancer 10/30/2016  . Medically noncompliant 09/29/2015  . Depression 11/22/2013  . History of peripheral stem cell transplant ([place]) 09/29/2013  . Multiple myeloma in remission ([place]) 07/05/2013  . Anxiety 07/05/2013  . Adrenal insufficiency ([place]) 07/22/2012  . Hypertension     Past Surgical History:  Procedure Laterality Date  .  Bilateral hip replacement  1991; and 2009   due to bilateral hip AVN's. / reports left hip was not replaced  . EUS N/A 03/24/2013   Procedure: UPPER ENDOSCOPIC ULTRASOUND (EUS) LINEAR;  Surgeon: Beryle Beams, MD;  Location: [facility];  Service: Endoscopy;  Laterality: N/A;  . FINE NEEDLE ASPIRATION N/A 03/24/2013   Procedure: FINE NEEDLE ASPIRATION (FNA) LINEAR;  Surgeon: Beryle Beams, MD;  Location: [facility];  Service: Endoscopy;  Laterality: N/A;  . MASS BIOPSY   01/26/12   Right Ethmoid Mass - Plasma Cell Neoplasm    Current Outpatient Rx  . Order #: [id number] Class: Normal  . Order #: [id number] Class: Print  . Order #: [id number] Class: Historical Med  . Order #: [id number] Class: Historical Med  . Order #: [id number] Class: Historical Med  . Order #: [id number] Class: Print  . Order #: [id number] Class: Print  . Order #: [id number] Class: Normal    Allergies Patient has no known allergies.  Family History  Problem Relation Age of Onset  . Adopted: Yes    Social History Social History  Substance Use Topics  . Smoking status: Never Smoker  . Smokeless tobacco: Never Used  . Alcohol use Yes     Comment: Hx of Alcohol Abuse.09/18/13 denies but says drinks etoh    Review of Systems  Constitutional: No fever/chills. Positive fatigue.  Eyes: No visual changes. ENT: No sore throat. Positive nosebleed. Cardiovascular: Denies chest pain. Respiratory: Denies shortness of breath. Gastrointestinal: No abdominal pain. Positive nausea and vomiting.  No diarrhea.  No constipation. Genitourinary: Negative for dysuria. Musculoskeletal: Negative for back pain. Skin: Negative for rash. Neurological: Negative for headaches, focal weakness or numbness.  10-point ROS otherwise negative.  ____________________________________________   PHYSICAL EXAM:  VITAL SIGNS: ED Triage Vitals  Enc Vitals Group     BP 12/08/16 1120 117/71     Pulse Rate 12/08/16 1120 79     Resp 12/08/16 1120 18     Temp 12/08/16 1120 98.2 F (36.8 C)     Temp Source 12/08/16 1120 Oral     SpO2 12/08/16 1120 100 %     Weight 12/08/16 1121 270 lb (122.5 kg)     Height 12/08/16 1121 6' 5"  (1.956 m)     Pain Score 12/08/16 1121 4   Constitutional: Alert and oriented. Well appearing and in no acute distress. Eyes: Conjunctivae are normal.  Head: Atraumatic. Nose: No congestion/rhinnorhea. No active epistaxis. Does have a scabbed area in the right nostril from possible recetn  nosebleed.  Mouth/Throat: Mucous membranes are moist.  Oropharynx non-erythematous. Neck: No stridor.   Cardiovascular: Normal rate, regular rhythm. Good peripheral circulation. Grossly normal heart sounds.   Respiratory: Normal respiratory effort.  No retractions. Lungs CTAB. Gastrointestinal: Soft and nontender. No distention.  Musculoskeletal: No lower extremity tenderness nor edema. No gross deformities of extremities. Neurologic:  Normal speech and language. No gross focal neurologic deficits are appreciated.  Skin:  Skin is warm, dry and intact. No rash noted. Psychiatric: Mood and affect are normal. Speech and behavior are normal.  ____________________________________________   LABS (all labs ordered are listed, but only abnormal results are displayed)  Labs Reviewed  CBC - Abnormal; Notable for the following:       Result Value   RBC 3.66 (*)    Hemoglobin 12.6 (*)    HCT 35.3 (*)    MCH 34.4 (*)    Platelets 97 (*)    All other components within normal limits  URINALYSIS, ROUTINE W  REFLEX MICROSCOPIC - Abnormal; Notable for the following:    Hgb urine dipstick MODERATE (*)    Protein, ur 30 (*)    All other components within normal limits  COMPREHENSIVE METABOLIC PANEL  TYPE AND SCREEN   ____________________________________________  EKG   EKG Interpretation  Date/Time:  Tuesday December 08 2016 12:23:14 EST Ventricular Rate:  78 PR Interval:    QRS Duration: 98 QT Interval:  385 QTC Calculation: 439 R Axis:   45 Text Interpretation:  Sinus rhythm Baseline wander in lead(s) V2 V3 V5 No STEMI.  Confirmed by Zaelyn Noack MD, Jisel Fleet [phone]) on 12/08/2016 12:35:29 PM       ____________________________________________  RADIOLOGY  None ____________________________________________   PROCEDURES  Procedure(s) performed:   Procedures  None ____________________________________________   INITIAL IMPRESSION / ASSESSMENT AND PLAN / ED COURSE  Pertinent labs &  imaging results that were available during my care of the patient were reviewed by me and considered in my medical decision making (see chart for details).  Patient resents to the emergency department for evaluation of possible hematemesis. By history it seems more consistent with epistaxis and resulting nausea and vomiting. Patient's hemoglobin is similar to recent values. No electrolyte abnormalities. Normal kidney function. Normal calcium. We'll add on urinalysis with recent hematuria and obtain an EKG with his ongoing fatigue. Plan for IV fluids and reassessment.   02:38 PM Lab work is largely unremarkable. Patient does have moderate hemoglobin the urine with no red blood cells seen. No evidence of infection. No clinical signs or symptoms of kidney stone to require further imaging at this time. Blood counts are normal. Plan for primary care physician follow-up. Suspect the patient's concern for hematemesis was actually a nosebleed event. If provided outpatient management instructions if nosebleed returns. Discussed return precautions in detail with the patient.  At this time, I do not feel there is any life-threatening condition present. I have reviewed and discussed all results (EKG, imaging, lab, urine as appropriate), exam findings with patient. I have reviewed nursing notes and appropriate previous records.  I feel the patient is safe to be discharged home without further emergent workup. Discussed usual and customary return precautions. Patient and family (if present) verbalize understanding and are comfortable with this plan.  Patient will follow-up with their primary care provider. If they do not have a primary care provider, information for follow-up has been provided to them. All questions have been answered.  ____________________________________________  FINAL CLINICAL IMPRESSION(S) / ED DIAGNOSES  Final diagnoses:  Fatigue, unspecified type  Epistaxis     MEDICATIONS GIVEN DURING  THIS VISIT:  Medications  sodium chloride 0.9 % bolus 1,000 mL (0 mLs Intravenous Stopped 12/08/16 1346)     NEW OUTPATIENT MEDICATIONS STARTED DURING THIS VISIT:  None   Note:  This document was prepared using Dragon voice recognition software and may include unintentional dictation errors.  Nanda Quinton, MD Emergency Medicine   Margette Fast, MD 12/08/16 [id number]

## 2016-12-08 NOTE — ED Triage Notes (Signed)
Pt states he has been throwing up bright red blood since Sunday. He has dizziness as well. NAD noted. VS stable in triage. Pt states he has hx of the same and that was when he was dx with multiple myeloma

## 2016-12-14 ENCOUNTER — Other Ambulatory Visit (HOSPITAL_COMMUNITY): Payer: Self-pay | Admitting: Hematology & Oncology

## 2017-01-21 ENCOUNTER — Emergency Department (HOSPITAL_COMMUNITY)
Admission: EM | Admit: 2017-01-21 | Discharge: 2017-01-21 | Disposition: A | Discharge status: Home / Self Care | Payer: No Typology Code available for payment source

## 2017-01-21 NOTE — ED Notes (Signed)
No answer in waiting room X3, registration advised that pt has not returned

## 2017-01-21 NOTE — ED Notes (Signed)
No answer in waiting room X2,  

## 2017-01-21 NOTE — ED Notes (Signed)
No answer in waiting room, registration advised that pt had walked outside,

## 2017-01-26 ENCOUNTER — Encounter (HOSPITAL_COMMUNITY): Payer: Self-pay

## 2017-01-26 ENCOUNTER — Emergency Department (HOSPITAL_COMMUNITY): Payer: No Typology Code available for payment source

## 2017-01-26 ENCOUNTER — Observation Stay (HOSPITAL_COMMUNITY)
Admission: EM | Admit: 2017-01-26 | Discharge: 2017-01-27 | Disposition: A | Discharge status: Home / Self Care | Payer: No Typology Code available for payment source | Attending: Internal Medicine | Admitting: Internal Medicine

## 2017-01-26 DIAGNOSIS — Z85828 Personal history of other malignant neoplasm of skin: Secondary | ICD-10-CM | POA: Diagnosis not present

## 2017-01-26 DIAGNOSIS — Z9481 Bone marrow transplant status: Secondary | ICD-10-CM

## 2017-01-26 DIAGNOSIS — F1011 Alcohol abuse, in remission: Secondary | ICD-10-CM

## 2017-01-26 DIAGNOSIS — I951 Orthostatic hypotension: Secondary | ICD-10-CM | POA: Diagnosis not present

## 2017-01-26 DIAGNOSIS — J208 Acute bronchitis due to other specified organisms: Secondary | ICD-10-CM

## 2017-01-26 DIAGNOSIS — N289 Disorder of kidney and ureter, unspecified: Secondary | ICD-10-CM | POA: Diagnosis not present

## 2017-01-26 DIAGNOSIS — Z9484 Stem cells transplant status: Secondary | ICD-10-CM

## 2017-01-26 DIAGNOSIS — I10 Essential (primary) hypertension: Secondary | ICD-10-CM | POA: Diagnosis not present

## 2017-01-26 DIAGNOSIS — D72819 Decreased white blood cell count, unspecified: Secondary | ICD-10-CM | POA: Diagnosis present

## 2017-01-26 DIAGNOSIS — E860 Dehydration: Secondary | ICD-10-CM | POA: Diagnosis not present

## 2017-01-26 DIAGNOSIS — Z79899 Other long term (current) drug therapy: Secondary | ICD-10-CM | POA: Insufficient documentation

## 2017-01-26 DIAGNOSIS — R040 Epistaxis: Secondary | ICD-10-CM | POA: Diagnosis not present

## 2017-01-26 DIAGNOSIS — C9001 Multiple myeloma in remission: Secondary | ICD-10-CM | POA: Diagnosis present

## 2017-01-26 DIAGNOSIS — I959 Hypotension, unspecified: Secondary | ICD-10-CM | POA: Diagnosis not present

## 2017-01-26 DIAGNOSIS — R55 Syncope and collapse: Secondary | ICD-10-CM | POA: Diagnosis present

## 2017-01-26 DIAGNOSIS — I9589 Other hypotension: Secondary | ICD-10-CM | POA: Diagnosis not present

## 2017-01-26 DIAGNOSIS — J209 Acute bronchitis, unspecified: Secondary | ICD-10-CM | POA: Diagnosis present

## 2017-01-26 DIAGNOSIS — D696 Thrombocytopenia, unspecified: Secondary | ICD-10-CM | POA: Diagnosis present

## 2017-01-26 LAB — CBC WITH DIFFERENTIAL/PLATELET
Basophils Absolute: 0 10*3/uL (ref 0.0–0.1)
Basophils Relative: 0 %
Eosinophils Absolute: 0.1 10*3/uL (ref 0.0–0.7)
Eosinophils Relative: 4 %
HEMATOCRIT: 37.1 % — AB (ref 39.0–52.0)
HEMOGLOBIN: 13.1 g/dL (ref 13.0–17.0)
LYMPHS ABS: 0.6 10*3/uL — AB (ref 0.7–4.0)
Lymphocytes Relative: 24 %
MCH: 33.4 pg (ref 26.0–34.0)
MCHC: 35.3 g/dL (ref 30.0–36.0)
MCV: 94.6 fL (ref 78.0–100.0)
MONOS PCT: 12 %
Monocytes Absolute: 0.3 10*3/uL (ref 0.1–1.0)
NEUTROS ABS: 1.4 10*3/uL — AB (ref 1.7–7.7)
NEUTROS PCT: 60 %
Platelets: 78 10*3/uL — ABNORMAL LOW (ref 150–400)
RBC: 3.92 MIL/uL — AB (ref 4.22–5.81)
RDW: 12.8 % (ref 11.5–15.5)
WBC: 2.4 10*3/uL — ABNORMAL LOW (ref 4.0–10.5)

## 2017-01-26 LAB — URINALYSIS, ROUTINE W REFLEX MICROSCOPIC
BILIRUBIN URINE: NEGATIVE
Bacteria, UA: NONE SEEN
Glucose, UA: NEGATIVE mg/dL
Ketones, ur: NEGATIVE mg/dL
LEUKOCYTES UA: NEGATIVE
Nitrite: NEGATIVE
PH: 5 (ref 5.0–8.0)
Protein, ur: NEGATIVE mg/dL
SPECIFIC GRAVITY, URINE: 1.017 (ref 1.005–1.030)
Squamous Epithelial / LPF: NONE SEEN

## 2017-01-26 LAB — COMPREHENSIVE METABOLIC PANEL
ALK PHOS: 68 U/L (ref 38–126)
ALT: 37 U/L (ref 17–63)
ANION GAP: 8 (ref 5–15)
AST: 31 U/L (ref 15–41)
Albumin: 3.8 g/dL (ref 3.5–5.0)
BILIRUBIN TOTAL: 0.7 mg/dL (ref 0.3–1.2)
BUN: 19 mg/dL (ref 6–20)
CHLORIDE: 104 mmol/L (ref 101–111)
CO2: 25 mmol/L (ref 22–32)
CREATININE: 1.64 mg/dL — AB (ref 0.61–1.24)
Calcium: 8.9 mg/dL (ref 8.9–10.3)
GFR calc non Af Amer: 46 mL/min — ABNORMAL LOW (ref >60)
GFR, EST AFRICAN AMERICAN: 54 mL/min — AB (ref >60)
GLUCOSE: 101 mg/dL — AB (ref 65–99)
Potassium: 3.3 mmol/L — ABNORMAL LOW (ref 3.5–5.1)
Sodium: 137 mmol/L (ref 135–145)
Total Protein: 7 g/dL (ref 6.5–8.1)

## 2017-01-26 LAB — TYPE AND SCREEN
ABO/RH(D): A POS
ANTIBODY SCREEN: NEGATIVE

## 2017-01-26 LAB — PROTIME-INR
INR: 0.97
Prothrombin Time: 12.8 seconds (ref 11.4–15.2)

## 2017-01-26 MED ORDER — POTASSIUM CHLORIDE IN NACL 20-0.9 MEQ/L-% IV SOLN
INTRAVENOUS | Status: DC
  Administered 2017-01-27: 02:00:00 via INTRAVENOUS

## 2017-01-26 MED ORDER — ESCITALOPRAM OXALATE 10 MG PO TABS
20.0000 mg | ORAL_TABLET | Freq: Every day | ORAL | Status: DC
  Administered 2017-01-27: 20 mg via ORAL
  Filled 2017-01-26: qty 2

## 2017-01-26 MED ORDER — SODIUM CHLORIDE 0.9 % IV BOLUS (SEPSIS)
1000.0000 mL | Freq: Once | INTRAVENOUS | Status: AC
  Administered 2017-01-26: 1000 mL via INTRAVENOUS

## 2017-01-26 MED ORDER — ACETAMINOPHEN 650 MG RE SUPP: 650.0000 mg | Freq: Four times a day (QID) | RECTAL | Status: DC | PRN

## 2017-01-26 MED ORDER — LEVOFLOXACIN 750 MG PO TABS
750.0000 mg | ORAL_TABLET | ORAL | Status: DC
  Administered 2017-01-26: 750 mg via ORAL
  Filled 2017-01-26: qty 1

## 2017-01-26 MED ORDER — SODIUM CHLORIDE 0.9 % IV BOLUS (SEPSIS)
2000.0000 mL | Freq: Once | INTRAVENOUS | Status: AC
  Administered 2017-01-26: 2000 mL via INTRAVENOUS

## 2017-01-26 MED ORDER — ONDANSETRON HCL 4 MG PO TABS: 4.0000 mg | ORAL_TABLET | Freq: Four times a day (QID) | ORAL | Status: DC | PRN

## 2017-01-26 MED ORDER — PANTOPRAZOLE SODIUM 40 MG PO TBEC
40.0000 mg | DELAYED_RELEASE_TABLET | Freq: Every day | ORAL | Status: DC
  Administered 2017-01-27: 40 mg via ORAL
  Filled 2017-01-26: qty 1

## 2017-01-26 MED ORDER — ONDANSETRON HCL 4 MG/2ML IJ SOLN
4.0000 mg | Freq: Four times a day (QID) | INTRAMUSCULAR | Status: DC | PRN
  Administered 2017-01-27: 4 mg via INTRAVENOUS
  Filled 2017-01-26: qty 2

## 2017-01-26 MED ORDER — ACETAMINOPHEN 325 MG PO TABS
650.0000 mg | ORAL_TABLET | Freq: Four times a day (QID) | ORAL | Status: DC | PRN
  Administered 2017-01-26 – 2017-01-27 (×2): 650 mg via ORAL
  Filled 2017-01-26 (×2): qty 2

## 2017-01-26 MED ORDER — OXYMETAZOLINE HCL 0.05 % NA SOLN
2.0000 | Freq: Once | NASAL | Status: AC
  Administered 2017-01-26: 2 via NASAL
  Filled 2017-01-26: qty 15

## 2017-01-26 NOTE — H&P (Signed)
[facility] History and Physical  Austin Griffith:[id number] DOB: [birth year] DOA: 01/26/2017  Referring physician: Dr Dayna Barker PCP: Austin Kilts, MD   Chief Complaint: Lightheaded, passing out, nosebleeds  HPI: Austin Griffith is a 56 y.o. male with history of HTN, multiple myeloma presenting with dizziness, passing out and nosebleeds.  In ED BP's 80's- 90's, improved after 1L bolus to 118/78. Still feels bad.  Has had nasal congestion and raspy cough x 7- 10 days not better w Mucinex.  Having some diarrhea, moderate severity, no appetite.  Had fevers a few days then they "broke".  No abx.   Also frequently dizzy and lightheaded with standing for last few months.  Was taking his Benicar "prn" by measuring his BP first until about 3 mos when his PCP told him to take it "every day" and he has been since. Takes Ativan 1 pill per day and lexapro, acyclovir bid from his HCT days, has not been dc'd.  Prevacid otc, , no other OTC meds.  Drinks ETOH, hx etoh abuse in old chart, pt denies abuse.  No tobacco.   Also having nosebleeds off and on the last 1-2 months.  He had nosebleed which led to MM diagnosis in 2014, but none since then until lately.  Bleeds so much it "fills up my two hands" sometimes.  No blood thinner, or ASA, takes NSAID"s sparingly.  No trauma.    Pt is divorced, two children, owns a [employer].   Sees Dr Alvy Bimler now for MM, was seen in Jan 2018 and all myeloma markers were negative. He was dx'd in 4/13 with sinus masses treated with XRT, then had pathologic rib Fx in May 2014 and biopsy showed plasmacytoma and PET scan showed numerous lytic lesions.  Had 4 mos chemoRx then had autologous HCT in Dec 2014 and has been in remission since.       ROS  denies CP  no joint pain   no HA  no blurry vision  no rash  no nausea/ vomiting  no dysuria  no difficulty voiding  no change in urine color    Past Medical History  Past Medical History:  Diagnosis  Date  . Acid reflux   . Anxiety 07/05/2013  . AVN (avascular necrosis of bone) (HCC)    bilateral hips; s/p hip replacement in 1991 and 2009  . Blurred vision    Present for "many years"  . Bone metastases ([place]) 03/31/13    MR Abdomen -Lower Thoracic and Upper Lumbar spine  . Depression 11/22/2013  . Epistaxis 02/08/2012  . Extramedullary plasmacytoma in relapse ([place]) 04/10/2013  . Fatigue   . Fatigue 05/25/12   "Mild"  . Headache(784.0) 02/08/2012   Right Sided Headache Behind Right Eye  . History of radiation therapy 02/29/12-04/08/12   right  ethmoid sinus in 50.4Gy in 20 fxs  . Hyperlipidemia    diet control   . Hypertension   . Knee pain 10/18/2013  . Liver lesion 03/31/13   MR Abdomen  . Medically noncompliant 09/29/2015  . Multiple myeloma in remission ([place]) 11/22/2013  . Multiple myeloma, in relapse 07/05/2013  . Nausea alone 09/12/2013  . Neutropenic fever ([place]) 09/18/2013  . On antineoplastic chemotherapy started 04/08/13   Revlimid/ Velcade/ Dex  . Pancreatic lesion 03/31/13    MR Abdomen - Ucinate Process  . Plasmacytoma ([place])     Right Ethmoid Sinus; bone marrow biopsy on 03/10/13 showed normal myeloma FISH panel and cytogenetics.   Marland Kitchen  S/P radiation therapy 03/28/2013   Left posterior 7th Rib / 8Gy in 1 fraction  . Shingles Feb 24, 2015  . Thrombocytopenia ([place]) 05/25/12   Past Surgical History  Past Surgical History:  Procedure Laterality Date  . Bilateral hip replacement  1991; and 2009   due to bilateral hip AVN's. / reports left hip was not replaced  . EUS N/A 03/24/2013   Procedure: UPPER ENDOSCOPIC ULTRASOUND (EUS) LINEAR;  Surgeon: Beryle Beams, MD;  [facility];  Service: Endoscopy;  Laterality: N/A;  . FINE NEEDLE ASPIRATION N/A 03/24/2013   Procedure: FINE NEEDLE ASPIRATION (FNA) LINEAR;  Surgeon: Beryle Beams, MD;  [facility];  Service: Endoscopy;  Laterality: N/A;  . MASS BIOPSY  01/26/12   Right Ethmoid Mass - Plasma Cell Neoplasm    Family History  Family History  Problem Relation Age of Onset  . Adopted: Yes   Social History  reports that he has never smoked. He has never used smokeless tobacco. He reports that he drinks alcohol. He reports that he does not use drugs. Allergies No Known Allergies Home medications Prior to Admission medications   Medication Sig Start Date End Date Taking? Authorizing Provider  acyclovir (ZOVIRAX) 400 MG tablet take 1 tablet twice a day 12/14/16  Yes Holley Bouche, NP  escitalopram (LEXAPRO) 20 MG tablet Take 1 tablet (20 mg total) by mouth daily. 11/22/13  Yes Heath Lark, MD  Guaifenesin (MUCINEX MAXIMUM STRENGTH) 1200 MG TB12 Take 1 tablet by mouth daily.   Yes Historical Provider, MD  ibuprofen (ADVIL,MOTRIN) 200 MG tablet Take 200 mg by mouth every 6 (six) hours as needed.   Yes Historical Provider, MD  lansoprazole (PREVACID) 15 MG capsule Take 15 mg by mouth daily.   Yes Historical Provider, MD  loratadine (CLARITIN) 10 MG tablet Take 10 mg by mouth daily.   Yes Historical Provider, MD  LORazepam (ATIVAN) 1 MG tablet Take 1 tablet (1 mg total) by mouth 2 (two) times daily as needed for anxiety. 09/17/16  Yes Almyra Free Idol, PA-C  olmesartan (BENICAR) 20 MG tablet Take 0.5 tablets (10 mg total) by mouth daily. 09/17/16  Yes Almyra Free Idol, PA-C  ondansetron (ZOFRAN) 8 MG tablet Take 1 tablet (8 mg total) by mouth every 8 (eight) hours as needed for nausea. 11/06/13  Yes Heath Lark, MD   Liver Function Tests  Recent Labs Lab 01/26/17 1523  AST 31  ALT 37  ALKPHOS 68  BILITOT 0.7  PROT 7.0  ALBUMIN 3.8   No results for input(s): LIPASE, AMYLASE in the last 168 hours. CBC  Recent Labs Lab 01/26/17 1523  WBC 2.4*  NEUTROABS 1.4*  HGB 13.1  HCT 37.1*  MCV 94.6  PLT 78*   Basic Metabolic Panel  Recent Labs Lab 01/26/17 1523  NA 137  K 3.3*  CL 104  CO2 25  GLUCOSE 101*  BUN 19  CREATININE 1.64*  CALCIUM 8.9     Vitals:   01/26/17 1813 01/26/17 1830 01/26/17  1900 01/26/17 2000  BP: 138/81 117/78 115/76 127/81  Pulse: 69 65 61 61  Resp: 18 17 (!) 21 17  Temp:      TempSrc:      SpO2: 96% 100% 100% 100%  Weight:       Exam: Gen heavy set WM, coughing raspy cough off and on, nasal congestion Sitting BP 118/79 P 76,  Standing BP 109/77  P 84 , done after 2L NS bolus No rash, cyanosis  or gangrene Sclera anicteric, throat clear w some slight blood streaks post pharynx, no pus/ thrush  No jvd or bruits, no LAD in neck /axillae Chest clear bilat , no rales or wheezing RRR no MRG Abd soft ntnd no mass or ascites +bs obese GU normal male MS no joint effusions or deformity Ext no LE edema / no wounds or ulcers Neuro is alert, Ox 3 , nf   Na 137  K 3.3  BUN 199 Cr 1.64   Ca 8.9   LFT"s ok  eGFR 46 WBC 2.4k  Hb 13   plt 78k UA - negative  EKG (independ reviewed) > old ant-septal MI, otherwise negative, NSR CXR (independ reviewed) > no active disease   Assessment: 1. Lightheadedness/ syncope - dehydration/ diarrhea/ bronchitis / acute illness,  and BP meds causing hypotension and orthostasis/ syncope. Plan admit , lots of IVF's, hold BP medication.  Have higher threshold for restarting BP medications.  Watch for diarrhea and w/u if needed.  2. Hypotension - check am cortisol tomorrow, give IVF's, hold ARB 3. Cough / acute bronchitis - po abx (levaquin) will be started 4. Epistaxis - not sure what is causing this, hx of sinus tumors from.  Will need ENT input.  Has thrombocytopenia also, unclear cause, but doubt would cause epistaxis by itself.  5. Leukopenia - counts have been on low side since HCT in 12/14 (2.6- 5.4)  6. Thrombocytopenia - plts 78k, since HCT 12/14 plts have been on the low side (83- 109) 7. Hx multiple myeloma - sp chemoRx and HCT Dec 2014, in remission, seen by [place] in Jan '18 8. Hx etoh abuse -  Denies excessive use , don't see any stigmata for liver disease, INR 0.9 today and LFT"s ok.   9. AKI- presume this is vol  depletion / ARB related.  Hold ARB/ nsaids,  repeat Cr in am  10. HTN - hold meds, as above 11. Acyclovir - on since HCT in 2014, ? Dc,will hold for now  Plan - as above     Sol Blazing [facility] Pager [phone]   If 7PM-7AM, please contact night-coverage [email or web address] Password [facility] 01/26/2017, 8:48 PM

## 2017-01-26 NOTE — ED Notes (Signed)
Austin Griffith, pt's RN notified of pt's orthostatic vitals and pt's feeling of passing out. Pt's RN notified that myself, Nurse First, started an IV on pt, pt placed on monitor, EKG performed and MD notified of pt's condition.

## 2017-01-26 NOTE — ED Notes (Signed)
Dr. Dolly Rias at bedside. Dr. Dolly Rias made aware of orthostatic vitals.

## 2017-01-26 NOTE — ED Provider Notes (Signed)
[facility] DEPT Provider Note   CSN: [id number] Arrival date & time: 01/26/17  1348     History   Chief Complaint Chief Complaint  Patient presents with  . Epistaxis    HPI Austin Griffith is a 56 y.o. male.  HPI  This is a 56 year old male with past history multiple myeloma status post chemotherapy and radiation in remission for about 4 years. He presents immersed department today with a week of worsening nosebleeds that are enough to fill up in 8 ounces, per the patient. Also patient with multiple episodes of syncope when standing and has real significant lightheadedness with standing up. He said last time he bled like this when he had diagnosed most myeloma. He does not have any other symptoms at this time. No fevers, nausea, vomiting, diarrhea or constipation. No other associated symptoms or exacerbating symptoms.  Past Medical History:  Diagnosis Date  . Acid reflux   . Anxiety 07/05/2013  . AVN (avascular necrosis of bone) (HCC)    bilateral hips; s/p hip replacement in 1991 and 2009  . Blurred vision    Present for "many years"  . Bone metastases ([place]) 03/31/13    MR Abdomen -Lower Thoracic and Upper Lumbar spine  . Depression 11/22/2013  . Epistaxis 02/08/2012  . Extramedullary plasmacytoma in relapse ([place]) 04/10/2013  . Fatigue   . Fatigue 05/25/12   "Mild"  . Headache(784.0) 02/08/2012   Right Sided Headache Behind Right Eye  . History of radiation therapy 02/29/12-04/08/12   right  ethmoid sinus in 50.4Gy in 20 fxs  . Hyperlipidemia    diet control   . Hypertension   . Knee pain 10/18/2013  . Liver lesion 03/31/13   MR Abdomen  . Medically noncompliant 09/29/2015  . Multiple myeloma in remission ([place]) 11/22/2013  . Multiple myeloma, in relapse 07/05/2013  . Nausea alone 09/12/2013  . Neutropenic fever ([place]) 09/18/2013  . On antineoplastic chemotherapy started 04/08/13   Revlimid/ Velcade/ Dex  . Pancreatic lesion 03/31/13    MR Abdomen - Ucinate Process  .  Plasmacytoma ([place])     Right Ethmoid Sinus; bone marrow biopsy on 03/10/13 showed normal myeloma FISH panel and cytogenetics.   . S/P radiation therapy 03/28/2013   Left posterior 7th Rib / 8Gy in 1 fraction  . Shingles Feb 24, 2015  . Thrombocytopenia ([place]) 05/25/12    Patient Active Problem List   Diagnosis Date Noted  . Hematuria, gross 10/30/2016  . History of skin cancer 10/30/2016  . Medically noncompliant 09/29/2015  . Depression 11/22/2013  . History of peripheral stem cell transplant ([place]) 09/29/2013  . Multiple myeloma in remission ([place]) 07/05/2013  . Anxiety 07/05/2013  . Adrenal insufficiency ([place]) 07/22/2012  . Hypertension     Past Surgical History:  Procedure Laterality Date  . Bilateral hip replacement  1991; and 2009   due to bilateral hip AVN's. / reports left hip was not replaced  . EUS N/A 03/24/2013   Procedure: UPPER ENDOSCOPIC ULTRASOUND (EUS) LINEAR;  Surgeon: Beryle Beams, MD;  Location: [facility] ENDOSCOPY;  Service: Endoscopy;  Laterality: N/A;  . FINE NEEDLE ASPIRATION N/A 03/24/2013   Procedure: FINE NEEDLE ASPIRATION (FNA) LINEAR;  Surgeon: Beryle Beams, MD;  Location: [facility] ENDOSCOPY;  Service: Endoscopy;  Laterality: N/A;  . MASS BIOPSY  01/26/12   Right Ethmoid Mass - Plasma Cell Neoplasm       Home Medications    Prior to Admission medications   Medication Sig Start Date End Date  Taking? Authorizing Provider  acyclovir (ZOVIRAX) 400 MG tablet take 1 tablet twice a day 12/14/16  Yes Holley Bouche, NP  escitalopram (LEXAPRO) 20 MG tablet Take 1 tablet (20 mg total) by mouth daily. 11/22/13  Yes Heath Lark, MD  Guaifenesin (MUCINEX MAXIMUM STRENGTH) 1200 MG TB12 Take 1 tablet by mouth daily.   Yes Historical Provider, MD  ibuprofen (ADVIL,MOTRIN) 200 MG tablet Take 200 mg by mouth every 6 (six) hours as needed.   Yes Historical Provider, MD  lansoprazole (PREVACID) 15 MG capsule Take 15 mg by mouth daily.   Yes Historical Provider, MD  loratadine  (CLARITIN) 10 MG tablet Take 10 mg by mouth daily.   Yes Historical Provider, MD  LORazepam (ATIVAN) 1 MG tablet Take 1 tablet (1 mg total) by mouth 2 (two) times daily as needed for anxiety. 09/17/16  Yes Almyra Free Idol, PA-C  olmesartan (BENICAR) 20 MG tablet Take 0.5 tablets (10 mg total) by mouth daily. 09/17/16  Yes Almyra Free Idol, PA-C  ondansetron (ZOFRAN) 8 MG tablet Take 1 tablet (8 mg total) by mouth every 8 (eight) hours as needed for nausea. 11/06/13  Yes Heath Lark, MD    Family History Family History  Problem Relation Age of Onset  . Adopted: Yes    Social History Social History  Substance Use Topics  . Smoking status: Never Smoker  . Smokeless tobacco: Never Used  . Alcohol use Yes     Comment: Hx of Alcohol Abuse.09/18/13 denies but says drinks etoh     Allergies   Patient has no known allergies.   Review of Systems Review of Systems  All other systems reviewed and are negative.    Physical Exam Updated Vital Signs BP (!) 89/60   Pulse 63   Temp 97.6 F (36.4 C) (Oral)   Resp (!) 23   Wt 270 lb (122.5 kg)   SpO2 97%   BMI 32.02 kg/m   Physical Exam  Constitutional: He appears well-developed and well-nourished.  HENT:  Head: Normocephalic and atraumatic.  Dried blood in right nare but no obvious source of bleeding currently.   Eyes: Conjunctivae and EOM are normal.  Neck: Normal range of motion.  Cardiovascular: Normal rate.   Pulmonary/Chest: Effort normal. No respiratory distress. He has no wheezes.  Abdominal: Soft. He exhibits no distension. There is no tenderness.  Musculoskeletal: Normal range of motion.  Neurological: He is alert.  Skin: Skin is warm and dry.  Nursing note and vitals reviewed.    ED Treatments / Results  Labs (all labs ordered are listed, but only abnormal results are displayed) Labs Reviewed  CBC WITH DIFFERENTIAL/PLATELET - Abnormal; Notable for the following:       Result Value   WBC 2.4 (*)    RBC 3.92 (*)    HCT 37.1  (*)    Platelets 78 (*)    Neutro Abs 1.4 (*)    Lymphs Abs 0.6 (*)    All other components within normal limits  COMPREHENSIVE METABOLIC PANEL - Abnormal; Notable for the following:    Potassium 3.3 (*)    Glucose, Bld 101 (*)    Creatinine, Ser 1.64 (*)    GFR calc non Af Amer 46 (*)    GFR calc Af Amer 54 (*)    All other components within normal limits  PROTIME-INR  TYPE AND SCREEN    EKG  EKG Interpretation  Date/Time:  Tuesday January 26 2017 15:05:49 EDT Ventricular Rate:  70 PR Interval:  QRS Duration: 95 QT Interval:  405 QTC Calculation: 437 R Axis:   55 Text Interpretation:  Sinus rhythm Anteroseptal infarct, age indeterminate Confirmed by [facility] MD, Vic Esco [phone]) on 01/26/2017 4:26:33 PM       Radiology Dg Chest 2 View  Result Date: 01/26/2017 CLINICAL DATA:  56 year old male with history of intermittent epistaxis for 1 week. Coughing and congestion. EXAM: CHEST  2 VIEW COMPARISON:  Chest x-ray 02/24/2015. FINDINGS: Lung volumes are normal. No consolidative airspace disease. No pleural effusions. No pneumothorax. No pulmonary nodule or mass noted. Pulmonary vasculature and the cardiomediastinal silhouette are within normal limits. IMPRESSION: No radiographic evidence of acute cardiopulmonary disease. Electronically Signed   By: Vinnie Langton M.D.   On: 01/26/2017 17:22    Procedures Procedures (including critical care time)  CRITICAL CARE Performed by: Merrily Pew Total critical care time: 38 minutes Critical care time was exclusive of separately billable procedures and treating other patients. Critical care was necessary to treat or prevent imminent or life-threatening deterioration. Critical care was time spent personally by me on the following activities: development of treatment plan with patient and/or surrogate as well as nursing, discussions with consultants, evaluation of patient's response to treatment, examination of patient, obtaining history  from patient or surrogate, ordering and performing treatments and interventions, ordering and review of laboratory studies, ordering and review of radiographic studies, pulse oximetry and re-evaluation of patient's condition.   Medications Ordered in ED Medications  sodium chloride 0.9 % bolus 1,000 mL (0 mLs Intravenous Stopped 01/26/17 1921)  oxymetazoline (AFRIN) 0.05 % nasal spray 2 spray (2 sprays Right Nare Given 01/26/17 1720)  sodium chloride 0.9 % bolus 2,000 mL (0 mLs Intravenous Stopped 01/27/17 0203)     Initial Impression / Assessment and Plan / ED Course  I have reviewed the triage vital signs and the nursing notes.  Pertinent labs & imaging results that were available during my care of the patient were reviewed by me and considered in my medical decision making (see chart for details).     multipl.e episodes syncope over the last couple weeks. Patient states that this is abnormal and happens at times when he is been sitting standing walking driving or anything. He has nosebleeds as well. His exam here initially was showing orthostasis with pretty significant blood pressure drops and symptoms.  No evidence of anemia.  His EKG without any acute causes for his syncopal episodes.  He had improvement in his symptoms after a few liters of fluid but still felt very dizzy and presyncopal. This in combination with his long history of syncope he felt unsafe to go home. I discussed the need to keep up on his fluid intake however he was very adamant that there is something wrong and wanted to be admitted to the hospital. I discussed case with the hospitalist who agreed with an observation stay and gentle fluid hydration overnight reevaluation for any further workup in the morning.  Final Clinical Impressions(s) / ED Diagnoses   Final diagnoses:  Hypotension, unspecified hypotension type  Syncope, unspecified syncope type     Merrily Pew, MD 01/27/17 [phone]

## 2017-01-26 NOTE — ED Notes (Signed)
Gave patient sprite to drink as requested and approved by Dr Dayna Barker.

## 2017-01-26 NOTE — ED Notes (Addendum)
Pt reports that he feels like he is going to pass out. Pt sat in waiting room behind triage

## 2017-01-26 NOTE — ED Triage Notes (Signed)
Pt reports that he has had nose bleeds off and on for 1 week. Last nose bleed was this morning out of right nare. Reports he has been congesting and coughing as well

## 2017-01-27 DIAGNOSIS — I950 Idiopathic hypotension: Secondary | ICD-10-CM

## 2017-01-27 LAB — COMPREHENSIVE METABOLIC PANEL
ALBUMIN: 2.9 g/dL — AB (ref 3.5–5.0)
ALT: 26 U/L (ref 17–63)
ANION GAP: 6 (ref 5–15)
AST: 22 U/L (ref 15–41)
Alkaline Phosphatase: 52 U/L (ref 38–126)
BUN: 17 mg/dL (ref 6–20)
CHLORIDE: 109 mmol/L (ref 101–111)
CO2: 24 mmol/L (ref 22–32)
Calcium: 7.8 mg/dL — ABNORMAL LOW (ref 8.9–10.3)
Creatinine, Ser: 1.06 mg/dL (ref 0.61–1.24)
GFR calc non Af Amer: >60 mL/min (ref >60)
Glucose, Bld: 100 mg/dL — ABNORMAL HIGH (ref 65–99)
Potassium: 4 mmol/L (ref 3.5–5.1)
SODIUM: 139 mmol/L (ref 135–145)
Total Bilirubin: 0.4 mg/dL (ref 0.3–1.2)
Total Protein: 5.4 g/dL — ABNORMAL LOW (ref 6.5–8.1)

## 2017-01-27 LAB — CBC
HEMATOCRIT: 29.6 % — AB (ref 39.0–52.0)
HEMOGLOBIN: 10.6 g/dL — AB (ref 13.0–17.0)
MCH: 33.9 pg (ref 26.0–34.0)
MCHC: 35.8 g/dL (ref 30.0–36.0)
MCV: 94.6 fL (ref 78.0–100.0)
Platelets: 61 10*3/uL — ABNORMAL LOW (ref 150–400)
RBC: 3.13 MIL/uL — AB (ref 4.22–5.81)
RDW: 12.7 % (ref 11.5–15.5)
WBC: 2.4 10*3/uL — ABNORMAL LOW (ref 4.0–10.5)

## 2017-01-27 LAB — CORTISOL-AM, BLOOD: CORTISOL - AM: 5.9 ug/dL — AB (ref 6.7–22.6)

## 2017-01-27 LAB — C DIFFICILE QUICK SCREEN W PCR REFLEX
C DIFFICILE (CDIFF) INTERP: NOT DETECTED
C Diff antigen: NEGATIVE
C Diff toxin: NEGATIVE

## 2017-01-27 MED ORDER — LORAZEPAM 1 MG PO TABS
1.0000 mg | ORAL_TABLET | Freq: Two times a day (BID) | ORAL | Status: DC | PRN
  Administered 2017-01-27: 1 mg via ORAL
  Filled 2017-01-27: qty 1

## 2017-01-27 MED ORDER — GUAIFENESIN-DM 100-10 MG/5ML PO SYRP
5.0000 mL | ORAL_SOLUTION | ORAL | Status: DC | PRN
  Administered 2017-01-27: 5 mL via ORAL
  Filled 2017-01-27: qty 5

## 2017-01-27 NOTE — Progress Notes (Signed)
Patient discharged home with IV removed and site intact. Patient discharged with all personal belongings.

## 2017-01-27 NOTE — Progress Notes (Signed)
Patient had bloody nose for short amount of time. Patient coughed and spit out some blood, then it was done. Patient having bloody nose before admission. Patient also thought that he was going to vomit a few times from coughing. Denies nausea. Patient has only dry heaved and spit. No vomit. Patient does not want Zofran at this time. Tissues and emesis basin within reach of patient. Will continue to monitor.

## 2017-01-27 NOTE — Discharge Summary (Signed)
Physician Discharge Summary  Austin Griffith:[id number] DOB: 1960/12/07 DOA: 01/26/2017  PCP: Purvis Kilts, MD  Admit date: 01/26/2017 Discharge date: 01/27/2017  Time spent: 45 minutes  Recommendations for Outpatient Follow-up:  -Will be discharged home today. -Advised to follow up with PCP in 2 weeks.   Discharge Diagnoses:  Principal Problem:   Hypotension Active Problems:   Hypertension   Multiple myeloma in remission ([place])   History of peripheral stem cell transplant ([facility] [place])   Dehydration   Thrombocytopenia (HCC)   Leukopenia   Acute renal insufficiency   Acute bronchitis   Syncope   Orthostatic hypotension   Epistaxis   History of ETOH abuse   Discharge Condition: Stable and improved  Filed Weights   01/26/17 1443 01/26/17 2240 01/27/17 0524  Weight: 122.5 kg (270 lb) 119 kg (262 lb 6.4 oz) 120.5 kg (265 lb 11.2 oz)    History of present illness:  As per Dr. Jonnie Finner on 4/17: Austin Griffith is a 56 y.o. male with history of HTN, multiple myeloma presenting with dizziness, passing out and nosebleeds.  In ED BP's 80's- 90's, improved after 1L bolus to 118/78. Still feels bad.  Has had nasal congestion and raspy cough x 7- 10 days not better w Mucinex.  Having some diarrhea, moderate severity, no appetite.  Had fevers a few days then they "broke".  No abx.   Also frequently dizzy and lightheaded with standing for last few months.  Was taking his Benicar "prn" by measuring his BP first until about 3 mos when his PCP told him to take it "every day" and he has been since. Takes Ativan 1 pill per day and lexapro, acyclovir bid from his HCT days, has not been dc'd.  Prevacid otc, , no other OTC meds.  Drinks ETOH, hx etoh abuse in old chart, pt denies abuse.  No tobacco.   Also having nosebleeds off and on the last 1-2 months.  He had nosebleed which led to MM diagnosis in 2014, but none since then until lately.  Bleeds so much it "fills up my two hands"  sometimes.  No blood thinner, or ASA, takes NSAID"s sparingly.  No trauma.    Pt is divorced, two children, owns a [employer].   Sees Dr Alvy Bimler now for MM, was seen in Jan 2018 and all myeloma markers were negative. He was dx'd in 4/13 with sinus masses treated with XRT, then had pathologic rib Fx in May 2014 and biopsy showed plasmacytoma and PET scan showed numerous lytic lesions.  Had 4 mos chemoRx then had autologous HCT in Dec 2014 and has been in remission since.    Hospital Course:   Hypotension -Resolved with IVF. -Suspect decreased circulatory volume due to diarrhea and decreased PO intake past 7-10 days. -Advised to keep up with PO fluid intake over next few days.  Diarrhea -Etiology unclear; likely viral gastroenteritis. -C Diff PCR negative. -See no need for abx at present and will DC levaquin.  Procedures:  None   Consultations:  None  Discharge Instructions  Discharge Instructions    Increase activity slowly    Complete by:  As directed      Allergies as of 01/27/2017   No Known Allergies     Medication List    TAKE these medications   acyclovir 400 MG tablet Commonly known as:  ZOVIRAX take 1 tablet twice a day   escitalopram 20 MG tablet Commonly known as:  LEXAPRO Take 1  tablet (20 mg total) by mouth daily.   ibuprofen 200 MG tablet Commonly known as:  ADVIL,MOTRIN Take 200 mg by mouth every 6 (six) hours as needed.   lansoprazole 15 MG capsule Commonly known as:  PREVACID Take 15 mg by mouth daily.   loratadine 10 MG tablet Commonly known as:  CLARITIN Take 10 mg by mouth daily.   LORazepam 1 MG tablet Commonly known as:  ATIVAN Take 1 tablet (1 mg total) by mouth 2 (two) times daily as needed for anxiety.   MUCINEX MAXIMUM STRENGTH 1200 MG Tb12 Generic drug:  Guaifenesin Take 1 tablet by mouth daily.   ondansetron 8 MG tablet Commonly known as:  ZOFRAN Take 1 tablet (8 mg total) by mouth every 8 (eight) hours as needed  for nausea.      No Known Allergies Follow-up Information    Purvis Kilts, MD. Schedule an appointment as soon as possible for a visit in 2 week(s).   Specialty:  Family Medicine Contact information: [address] [place] [address] [phone]        Jeanann Lewandowsky, MD .   Specialty:  Internal Medicine Contact information: [address] [place] [address] [phone]            The results of significant diagnostics from this hospitalization (including imaging, microbiology, ancillary and laboratory) are listed below for reference.    Significant Diagnostic Studies: Dg Chest 2 View  Result Date: 01/26/2017 CLINICAL DATA:  56 year old male with history of intermittent epistaxis for 1 week. Coughing and congestion. EXAM: CHEST  2 VIEW COMPARISON:  Chest x-ray 02/24/2015. FINDINGS: Lung volumes are normal. No consolidative airspace disease. No pleural effusions. No pneumothorax. No pulmonary nodule or mass noted. Pulmonary vasculature and the cardiomediastinal silhouette are within normal limits. IMPRESSION: No radiographic evidence of acute cardiopulmonary disease. Electronically Signed   By: Vinnie Langton M.D.   On: 01/26/2017 17:22    Microbiology: Recent Results (from the past 240 hour(s))  C difficile quick scan w PCR reflex     Status: None   Collection Time: 01/27/17 12:11 PM  Result Value Ref Range Status   C Diff antigen NEGATIVE NEGATIVE Final   C Diff toxin NEGATIVE NEGATIVE Final   C Diff interpretation No C. difficile detected.  Final     Labs: Basic Metabolic Panel:  Recent Labs Lab 01/26/17 1523 01/27/17 0439  NA 137 139  K 3.3* 4.0  CL 104 109  CO2 25 24  GLUCOSE 101* 100*  BUN 19 17  CREATININE 1.64* 1.06  CALCIUM 8.9 7.8*   Liver Function Tests:  Recent Labs Lab 01/26/17 1523 01/27/17 0439  AST 31 22  ALT 37 26  ALKPHOS 68 52  BILITOT 0.7 0.4  PROT 7.0 5.4*  ALBUMIN  3.8 2.9*   No results for input(s): LIPASE, AMYLASE in the last 168 hours. No results for input(s): AMMONIA in the last 168 hours. CBC:  Recent Labs Lab 01/26/17 1523 01/27/17 0439  WBC 2.4* 2.4*  NEUTROABS 1.4*  --   HGB 13.1 10.6*  HCT 37.1* 29.6*  MCV 94.6 94.6  PLT 78* 61*   Cardiac Enzymes: No results for input(s): CKTOTAL, CKMB, CKMBINDEX, TROPONINI in the last 168 hours. BNP: BNP (last 3 results) No results for input(s): BNP in the last 8760 hours.  ProBNP (last 3 results) No results for input(s): PROBNP in the last 8760 hours.  CBG: No results for input(s): GLUCAP in the last 168  hours.     Signed:  Lelon Frohlich  [facility] Pager: [phone] 01/27/2017, 2:20 PM

## 2017-01-28 ENCOUNTER — Other Ambulatory Visit: Payer: No Typology Code available for payment source

## 2017-01-28 ENCOUNTER — Telehealth: Payer: Self-pay | Admitting: *Deleted

## 2017-01-28 LAB — GASTROINTESTINAL PANEL BY PCR, STOOL (REPLACES STOOL CULTURE)

## 2017-01-28 LAB — HIV ANTIBODY (ROUTINE TESTING W REFLEX): HIV SCREEN 4TH GENERATION: NONREACTIVE

## 2017-01-28 NOTE — Telephone Encounter (Signed)
LM to call to reschedule lab appt

## 2017-01-28 NOTE — Telephone Encounter (Signed)
-----   Message from Heath Lark, MD sent at 01/27/2017  4:15 PM EDT ----- Please ask when he feels he wants to come back ----- Message ----- From: Patton Salles, RN Sent: 01/27/2017   3:35 PM To: Heath Lark, MD  Caryl Pina says Mr Coe cancelled his lab appt for tomorrow because he has been in the hospital. You see him on 4/26, do you want to reschedule lab appt?

## 2017-02-01 ENCOUNTER — Telehealth: Payer: Self-pay

## 2017-02-01 NOTE — Telephone Encounter (Signed)
Left below message. Instructed to call us back.

## 2017-02-01 NOTE — Telephone Encounter (Signed)
-----   Message from Heath Lark, MD sent at 02/01/2017  9:14 AM EDT ----- Regarding: reschedule appt Can you call him and ask when he is available to come back? How about labs on 5/24 and see me 5/31?  If acceptable for him, I will place scheduling msg

## 2017-02-01 NOTE — Telephone Encounter (Deleted)
-----   Message from Heath Lark, MD sent at 02/01/2017  9:14 AM EDT ----- Regarding: reschedule appt Can you call him and ask when he is available to come back? How about labs on 5/24 and see me 5/31?  If acceptable for him, I will place scheduling msg

## 2017-02-02 ENCOUNTER — Telehealth: Payer: Self-pay | Admitting: Hematology and Oncology

## 2017-02-02 ENCOUNTER — Telehealth: Payer: Self-pay

## 2017-02-02 ENCOUNTER — Other Ambulatory Visit: Payer: Self-pay | Admitting: Hematology and Oncology

## 2017-02-02 NOTE — Telephone Encounter (Signed)
Spoke with patient re lab 5/24 and f/u 5/31. Rescheduled from April.

## 2017-02-02 NOTE — Telephone Encounter (Signed)
I will place scheduling msg because I have to change my orders

## 2017-02-02 NOTE — Telephone Encounter (Signed)
How about labs on 5/24 and see me 5/31?  If acceptable for him, I will place scheduling msg.  Patient called back and left message. He is agreeable to both of the above appointments.

## 2017-02-02 NOTE — Telephone Encounter (Signed)
Left below message again.

## 2017-02-04 ENCOUNTER — Ambulatory Visit: Payer: No Typology Code available for payment source | Admitting: Hematology and Oncology

## 2017-03-04 ENCOUNTER — Other Ambulatory Visit: Payer: No Typology Code available for payment source

## 2017-03-09 ENCOUNTER — Telehealth: Payer: Self-pay

## 2017-03-09 NOTE — Telephone Encounter (Signed)
-----   Message from Heath Lark, MD sent at 03/09/2017  6:54 AM EDT ----- Regarding: patient no show for labs He did not show up for labs, so his appt on Thursday is not helpful Can you call and ask when he wants to come back? Then we can plan reschedule appt

## 2017-03-09 NOTE — Telephone Encounter (Signed)
Called and left below message. 

## 2017-03-11 ENCOUNTER — Ambulatory Visit: Payer: No Typology Code available for payment source | Admitting: Hematology and Oncology

## 2017-03-11 ENCOUNTER — Encounter: Payer: Self-pay | Admitting: Hematology and Oncology

## 2017-04-07 ENCOUNTER — Other Ambulatory Visit (HOSPITAL_COMMUNITY): Payer: Self-pay | Admitting: Hematology and Oncology

## 2017-05-05 DIAGNOSIS — E6609 Other obesity due to excess calories: Secondary | ICD-10-CM | POA: Diagnosis not present

## 2017-05-05 DIAGNOSIS — Z1389 Encounter for screening for other disorder: Secondary | ICD-10-CM | POA: Diagnosis not present

## 2017-05-05 DIAGNOSIS — Z6831 Body mass index (BMI) 31.0-31.9, adult: Secondary | ICD-10-CM | POA: Diagnosis not present

## 2017-05-05 DIAGNOSIS — F419 Anxiety disorder, unspecified: Secondary | ICD-10-CM | POA: Diagnosis not present

## 2017-05-25 ENCOUNTER — Telehealth: Payer: Self-pay

## 2017-05-25 NOTE — Telephone Encounter (Signed)
Pt called to r/s missed appt, he said "last month", appt calendar says 5/24 lab and 5/31 MD. Durene Cal to Dr Alvy Bimler d/t time frame.

## 2017-05-26 ENCOUNTER — Telehealth: Payer: Self-pay | Admitting: Hematology and Oncology

## 2017-05-26 NOTE — Telephone Encounter (Signed)
Left pt a voicemail regarding their added appts for next week.

## 2017-05-26 NOTE — Telephone Encounter (Signed)
I have placed scheduling msg

## 2017-06-03 ENCOUNTER — Other Ambulatory Visit (HOSPITAL_BASED_OUTPATIENT_CLINIC_OR_DEPARTMENT_OTHER): Payer: BLUE CROSS/BLUE SHIELD

## 2017-06-03 DIAGNOSIS — C9001 Multiple myeloma in remission: Secondary | ICD-10-CM

## 2017-06-03 LAB — COMPREHENSIVE METABOLIC PANEL
ALT: 38 U/L (ref 0–55)
AST: 32 U/L (ref 5–34)
Albumin: 3.8 g/dL (ref 3.5–5.0)
Alkaline Phosphatase: 80 U/L (ref 40–150)
Anion Gap: 6 mEq/L (ref 3–11)
BILIRUBIN TOTAL: 0.75 mg/dL (ref 0.20–1.20)
BUN: 10.5 mg/dL (ref 7.0–26.0)
CALCIUM: 9.5 mg/dL (ref 8.4–10.4)
CHLORIDE: 105 meq/L (ref 98–109)
CO2: 27 mEq/L (ref 22–29)
CREATININE: 1.2 mg/dL (ref 0.7–1.3)
EGFR: 70 mL/min/{1.73_m2} — AB (ref >90)
Glucose: 107 mg/dl (ref 70–140)
Potassium: 4 mEq/L (ref 3.5–5.1)
Sodium: 138 mEq/L (ref 136–145)
TOTAL PROTEIN: 7.1 g/dL (ref 6.4–8.3)

## 2017-06-03 LAB — CBC WITH DIFFERENTIAL/PLATELET
BASO%: 0.9 % (ref 0.0–2.0)
Basophils Absolute: 0 10*3/uL (ref 0.0–0.1)
EOS%: 2 % (ref 0.0–7.0)
Eosinophils Absolute: 0.1 10*3/uL (ref 0.0–0.5)
HCT: 37.6 % — ABNORMAL LOW (ref 38.4–49.9)
HEMOGLOBIN: 13.1 g/dL (ref 13.0–17.1)
LYMPH#: 0.7 10*3/uL — AB (ref 0.9–3.3)
LYMPH%: 17.1 % (ref 14.0–49.0)
MCH: 34.3 pg — ABNORMAL HIGH (ref 27.2–33.4)
MCHC: 34.8 g/dL (ref 32.0–36.0)
MCV: 98.6 fL — ABNORMAL HIGH (ref 79.3–98.0)
MONO#: 0.3 10*3/uL (ref 0.1–0.9)
MONO%: 7.3 % (ref 0.0–14.0)
NEUT%: 72.7 % (ref 39.0–75.0)
NEUTROS ABS: 2.8 10*3/uL (ref 1.5–6.5)
Platelets: 113 10*3/uL — ABNORMAL LOW (ref 140–400)
RBC: 3.81 10*6/uL — ABNORMAL LOW (ref 4.20–5.82)
RDW: 13.4 % (ref 11.0–14.6)
WBC: 3.8 10*3/uL — AB (ref 4.0–10.3)

## 2017-06-04 LAB — KAPPA/LAMBDA LIGHT CHAINS
IG KAPPA FREE LIGHT CHAIN: 19.6 mg/L — AB (ref 3.3–19.4)
Ig Lambda Free Light Chain: 22.8 mg/L (ref 5.7–26.3)
Kappa/Lambda FluidC Ratio: 0.86 (ref 0.26–1.65)

## 2017-06-07 LAB — MULTIPLE MYELOMA PANEL, SERUM
ALBUMIN SERPL ELPH-MCNC: 3.8 g/dL (ref 2.9–4.4)
ALPHA 1: 0.2 g/dL (ref 0.0–0.4)
Albumin/Glob SerPl: 1.4 (ref 0.7–1.7)
Alpha2 Glob SerPl Elph-Mcnc: 0.6 g/dL (ref 0.4–1.0)
B-Globulin SerPl Elph-Mcnc: 1.2 g/dL (ref 0.7–1.3)
GAMMA GLOB SERPL ELPH-MCNC: 0.8 g/dL (ref 0.4–1.8)
GLOBULIN, TOTAL: 2.8 g/dL (ref 2.2–3.9)
IGA/IMMUNOGLOBULIN A, SERUM: 389 mg/dL — AB (ref 90–386)
IGM (IMMUNOGLOBIN M), SRM: 85 mg/dL (ref 20–172)
IgG, Qn, Serum: 767 mg/dL (ref 700–1600)
Total Protein: 6.6 g/dL (ref 6.0–8.5)

## 2017-06-10 ENCOUNTER — Telehealth: Payer: Self-pay | Admitting: Hematology and Oncology

## 2017-06-10 ENCOUNTER — Ambulatory Visit (HOSPITAL_BASED_OUTPATIENT_CLINIC_OR_DEPARTMENT_OTHER): Payer: BLUE CROSS/BLUE SHIELD | Admitting: Hematology and Oncology

## 2017-06-10 ENCOUNTER — Encounter: Payer: Self-pay | Admitting: Hematology and Oncology

## 2017-06-10 DIAGNOSIS — D61818 Other pancytopenia: Secondary | ICD-10-CM | POA: Diagnosis not present

## 2017-06-10 DIAGNOSIS — C9001 Multiple myeloma in remission: Secondary | ICD-10-CM | POA: Diagnosis not present

## 2017-06-10 DIAGNOSIS — Z9481 Bone marrow transplant status: Secondary | ICD-10-CM | POA: Diagnosis not present

## 2017-06-10 DIAGNOSIS — F1020 Alcohol dependence, uncomplicated: Secondary | ICD-10-CM | POA: Diagnosis not present

## 2017-06-10 DIAGNOSIS — Z85828 Personal history of other malignant neoplasm of skin: Secondary | ICD-10-CM

## 2017-06-10 NOTE — Assessment & Plan Note (Signed)
The patient was recently lost to follow-up He has not returned to [facility] for over a year The patient remained in clinical remission with no signs of disease I will see him back in 6 months with history, physical examination, blood work to be done a week before I see him back

## 2017-06-10 NOTE — Assessment & Plan Note (Signed)
The patient is more than 2 years out from his prior bone marrow transplant He has completed all his vaccination program He will continue annual influenza vaccination

## 2017-06-10 NOTE — Progress Notes (Signed)
[facility] OFF[facility] PROGRESS NOTE  Patient Care Team: Sharilyn Sites, MD as PCP - General (Family Medicine) Jeanann Lewandowsky, MD as PCP - Hematology/Oncology (Hematology and Oncology) Melissa Montane, MD as Attending Physician (Otolaryngology) Eppie Gibson, MD (Radiation Oncology) Elayne Snare, MD as Attending Physician (Endocrinology) Carol Ada, MD as Attending Physician (Gastroenterology) Heath Lark, MD as Consulting Physician (Hematology and Oncology)  SUMMARY OF ONCOLOGIC HISTORY: Oncology History   IgG Kappa light chain disease, Durie-Salmon stage III     Multiple myeloma in remission ([place])   01/19/2012 Imaging    The patient has CT scan of the head for evaluation of severe headaches which show abnormalities in the sinus      01/22/2012 Bone Marrow Biopsy    Bone marrow biopsy show only 2% plasma cell      01/22/2012 Imaging    CT scan of the sinus showed Ethmoid mucocele with some expansion of the air cells and soft tissue extending into the upper nasal cavity.       01/26/2012 Procedure    Biopsy of the sinus mass confirmed plasmacytoma      02/11/2012 Imaging    PET scan showed Expansile soft tissue in the right ethmoid sinus and sphenoid sinuses has an S U V max of 24.6.  No additional areas of abnormalities elsewhere.        05/20/2012 Imaging    Repeat CT scan of the sinuses show complete response to treatment      03/03/2013 Imaging    CT scan of the chest showed fracture of the left seventh rib represents a pathologic fracture.  There is a destructive soft tissue mass at the site of the fracture worrisome for malignancy.       03/10/2013 Bone Marrow Biopsy    Repeat bone marrow biopsy again initial 2% plasma cell      03/17/2013 Imaging    Repeat PET scan showed numerous lytic myelomatous bone lesions involving the spine, left ribs and left iliac bone. There were also two pancreatic lesions and possibly three liver lesions suspicious for metastatic  disease.        03/21/2013 Procedure    Biopsy of the rib lesion came back plasmacytoma      03/24/2013 Procedure    Fine-needle aspirate and biopsy of the pancreatic mass confirmed plasma cell       06/09/2013 - 08/09/2013 Chemotherapy    The patient completed 4 months of induction chemotherapy with Revlimid, Velcade and dexamethasone along with Zometa      08/21/2013 Imaging    Repeat PET scan at [facility] show persistent lytic lesions but there were no abnormalities in the pancreas or liver      09/28/2013 Bone Marrow Transplant    The patient underwent autologous stem cell rescue after conditioning therapy with melphalan      02/26/2015 Miscellaneous    Established oncology care at  [facility]      02/26/2015 Miscellaneous    Non-complicance with appointments: No show on: 03/29/2015, 04/03/2015, 04/25/2015.       INTERVAL HISTORY: Please see below for problem oriented charting. He returns for further follow-up He denies recent infection No new lymphadenopathy No new skin lesions No new bone pain. The patient denies any recent signs or symptoms of bleeding such as spontaneous epistaxis, hematuria or hematochezia.   REVIEW OF SYSTEMS:   Constitutional: Denies fevers, chills or abnormal weight loss Eyes: Denies blurriness of vision Ears, nose, mouth, throat, and face:  Denies mucositis or sore throat Respiratory: Denies cough, dyspnea or wheezes Cardiovascular: Denies palpitation, chest discomfort or lower extremity swelling Gastrointestinal:  Denies nausea, heartburn or change in bowel habits Skin: Denies abnormal skin rashes Lymphatics: Denies new lymphadenopathy or easy bruising Neurological:Denies numbness, tingling or new weaknesses Behavioral/Psych: Mood is stable, no new changes  All other systems were reviewed with the patient and are negative.  I have reviewed the past medical history, past surgical history, social history and family history with the  patient and they are unchanged from previous note.  ALLERGIES:  has No Known Allergies.  MEDICATIONS:  Current Outpatient Prescriptions  Medication Sig Dispense Refill  . escitalopram (LEXAPRO) 20 MG tablet Take 1 tablet (20 mg total) by mouth daily. 90 tablet 3  . ibuprofen (ADVIL,MOTRIN) 200 MG tablet Take 200 mg by mouth every 6 (six) hours as needed.    . lansoprazole (PREVACID) 15 MG capsule Take 15 mg by mouth daily.    Marland Kitchen loratadine (CLARITIN) 10 MG tablet Take 10 mg by mouth daily.    Marland Kitchen LORazepam (ATIVAN) 1 MG tablet Take 1 tablet (1 mg total) by mouth 2 (two) times daily as needed for anxiety. 20 tablet 0   No current facility-administered medications for this visit.     PHYSICAL EXAMINATION: ECOG PERFORMANCE STATUS: 0 - Asymptomatic  Vitals:   06/10/17 1317  BP: (!) 141/86  Pulse: 94  Resp: 18  Temp: 99.2 F (37.3 C)  SpO2: 100%   Filed Weights   06/10/17 1317  Weight: 269 lb 3.2 oz (122.1 kg)    GENERAL:alert, no distress and comfortable SKIN: skin color, texture, turgor are normal, no rashes or significant lesions EYES: normal, Conjunctiva are pink and non-injected, sclera clear OROPHARYNX:no exudate, no erythema and lips, buccal mucosa, and tongue normal  NECK: supple, thyroid normal size, non-tender, without nodularity LYMPH:  no palpable lymphadenopathy in the cervical, axillary or inguinal LUNGS: clear to auscultation and percussion with normal breathing effort HEART: regular rate & rhythm and no murmurs and no lower extremity edema ABDOMEN:abdomen soft, non-tender and normal bowel sounds Musculoskeletal:no cyanosis of digits and no clubbing  NEURO: alert & oriented x 3 with fluent speech, no focal motor/sensory deficits  LABORATORY DATA:  I have reviewed the data as listed    Component Value Date/Time   NA 138 06/03/2017 1336   K 4.0 06/03/2017 1336   CL 109 01/27/2017 0439   CL 105 03/30/2013 1340   CO2 27 06/03/2017 1336   GLUCOSE 107 06/03/2017  1336   GLUCOSE 120 (H) 03/30/2013 1340   BUN 10.5 06/03/2017 1336   CREATININE 1.2 06/03/2017 1336   CALCIUM 9.5 06/03/2017 1336   PROT 7.1 06/03/2017 1336   PROT 6.6 06/03/2017 1336   ALBUMIN 3.8 06/03/2017 1336   AST 32 06/03/2017 1336   ALT 38 06/03/2017 1336   ALKPHOS 80 06/03/2017 1336   BILITOT 0.75 06/03/2017 1336   GFRNONAA >60 01/27/2017 0439   GFRAA >60 01/27/2017 0439    No results found for: SPEP, UPEP  Lab Results  Component Value Date   WBC 3.8 (L) 06/03/2017   NEUTROABS 2.8 06/03/2017   HGB 13.1 06/03/2017   HCT 37.6 (L) 06/03/2017   MCV 98.6 (H) 06/03/2017   PLT 113 (L) 06/03/2017      Chemistry      Component Value Date/Time   NA 138 06/03/2017 1336   K 4.0 06/03/2017 1336   CL 109 01/27/2017 0439   CL 105 03/30/2013  1340   CO2 27 06/03/2017 1336   BUN 10.5 06/03/2017 1336   CREATININE 1.2 06/03/2017 1336      Component Value Date/Time   CALCIUM 9.5 06/03/2017 1336   ALKPHOS 80 06/03/2017 1336   AST 32 06/03/2017 1336   ALT 38 06/03/2017 1336   BILITOT 0.75 06/03/2017 1336    ASSESSMENT & PLAN:  Multiple myeloma in remission [facility]) The patient was recently lost to follow-up He has not returned to [facility] for over a year The patient remained in clinical remission with no signs of disease I will see him back in 6 months with history, physical examination, blood work to be done a week before I see him back  S/P bone marrow transplant ([place]) The patient is more than 2 years out from his prior bone marrow transplant He has completed all his vaccination program He will continue annual influenza vaccination  History of skin cancer He had recent diagnosis of skin cancer I recommend close surveillance with his dermatologist  Pancytopenia, acquired [facility]) He has mild pancytopenia likely due to recent alcoholism He is not symptomatic Observe   No orders of the defined types were placed in this encounter.  All questions were answered. The patient  knows to call the clinic with any problems, questions or concerns. No barriers to learning was detected. I spent 15 minutes counseling the patient face to face. The total time spent in the appointment was 20 minutes and more than 50% was on counseling and review of test results     Heath Lark, MD 06/10/2017 4:29 PM

## 2017-06-10 NOTE — Assessment & Plan Note (Signed)
He has mild pancytopenia likely due to recent alcoholism He is not symptomatic Observe

## 2017-06-10 NOTE — Assessment & Plan Note (Signed)
He had recent diagnosis of skin cancer I recommend close surveillance with his dermatologist

## 2017-06-10 NOTE — Telephone Encounter (Signed)
Gave patient AVS and calendar of upcoming February appointments.  °

## 2017-12-02 ENCOUNTER — Other Ambulatory Visit: Payer: Self-pay | Admitting: Hematology and Oncology

## 2017-12-02 ENCOUNTER — Other Ambulatory Visit: Payer: BLUE CROSS/BLUE SHIELD

## 2017-12-02 DIAGNOSIS — C9001 Multiple myeloma in remission: Secondary | ICD-10-CM

## 2017-12-09 ENCOUNTER — Encounter: Payer: Self-pay | Admitting: Hematology and Oncology

## 2017-12-09 ENCOUNTER — Inpatient Hospital Stay: Payer: BLUE CROSS/BLUE SHIELD | Attending: Hematology and Oncology | Admitting: Hematology and Oncology

## 2017-12-09 ENCOUNTER — Telehealth: Payer: Self-pay | Admitting: Hematology and Oncology

## 2017-12-09 ENCOUNTER — Inpatient Hospital Stay: Payer: BLUE CROSS/BLUE SHIELD

## 2017-12-09 DIAGNOSIS — Z85828 Personal history of other malignant neoplasm of skin: Secondary | ICD-10-CM

## 2017-12-09 DIAGNOSIS — Z9481 Bone marrow transplant status: Secondary | ICD-10-CM | POA: Diagnosis not present

## 2017-12-09 DIAGNOSIS — Z85820 Personal history of malignant melanoma of skin: Secondary | ICD-10-CM | POA: Diagnosis not present

## 2017-12-09 DIAGNOSIS — C9001 Multiple myeloma in remission: Secondary | ICD-10-CM | POA: Diagnosis not present

## 2017-12-09 LAB — CBC WITH DIFFERENTIAL/PLATELET
BASOS ABS: 0 10*3/uL (ref 0.0–0.1)
BASOS PCT: 1 %
EOS PCT: 4 %
Eosinophils Absolute: 0.2 10*3/uL (ref 0.0–0.5)
HCT: 38.1 % — ABNORMAL LOW (ref 38.4–49.9)
Hemoglobin: 13.1 g/dL (ref 13.0–17.1)
LYMPHS PCT: 21 %
Lymphs Abs: 1 10*3/uL (ref 0.9–3.3)
MCH: 33.9 pg — ABNORMAL HIGH (ref 27.2–33.4)
MCHC: 34.3 g/dL (ref 32.0–36.0)
MCV: 98.7 fL — AB (ref 79.3–98.0)
Monocytes Absolute: 0.4 10*3/uL (ref 0.1–0.9)
Monocytes Relative: 8 %
Neutro Abs: 3.2 10*3/uL (ref 1.5–6.5)
Neutrophils Relative %: 66 %
PLATELETS: 133 10*3/uL — AB (ref 140–400)
RBC: 3.86 MIL/uL — ABNORMAL LOW (ref 4.20–5.82)
RDW: 13 % (ref 11.0–14.6)
WBC: 4.8 10*3/uL (ref 4.0–10.3)

## 2017-12-09 LAB — COMPREHENSIVE METABOLIC PANEL
ALT: 44 U/L (ref 0–55)
ANION GAP: 9 (ref 3–11)
AST: 35 U/L — ABNORMAL HIGH (ref 5–34)
Albumin: 4.1 g/dL (ref 3.5–5.0)
Alkaline Phosphatase: 79 U/L (ref 40–150)
BILIRUBIN TOTAL: 0.7 mg/dL (ref 0.2–1.2)
BUN: 11 mg/dL (ref 7–26)
CO2: 25 mmol/L (ref 22–29)
Calcium: 9.8 mg/dL (ref 8.4–10.4)
Chloride: 104 mmol/L (ref 98–109)
Creatinine, Ser: 1.26 mg/dL (ref 0.70–1.30)
GFR calc non Af Amer: >60 mL/min (ref >60)
Glucose, Bld: 88 mg/dL (ref 70–140)
POTASSIUM: 4.5 mmol/L (ref 3.5–5.1)
Sodium: 138 mmol/L (ref 136–145)
TOTAL PROTEIN: 7.4 g/dL (ref 6.4–8.3)

## 2017-12-09 NOTE — Assessment & Plan Note (Signed)
He has no recent transplant follow-up He denies recent infection in his vaccination program appears to be up-to-date

## 2017-12-09 NOTE — Progress Notes (Signed)
[place] OFFICE PROGRESS NOTE  Patient Care Team: Sharilyn Sites, MD as PCP - General (Family Medicine) Jeanann Lewandowsky, MD as PCP - Hematology/Oncology (Hematology and Oncology) Melissa Montane, MD as Attending Physician (Otolaryngology) Eppie Gibson, MD (Radiation Oncology) Elayne Snare, MD as Attending Physician (Endocrinology) Carol Ada, MD as Attending Physician (Gastroenterology) Heath Lark, MD as Consulting Physician (Hematology and Oncology)  ASSESSMENT & PLAN:  Multiple myeloma in remission [facility]) The patient had multiple no-show and rescheduled appointment The patient appears upset because he thought he was supposed to get blood drawn today I informed him that he missed his blood appointment last week After much discussion, he is in agreement to proceed with blood draw today I will call him with test results I recommend seeing him back in 6 months until he has reached the 5 years post transplant date before spacing out his appointment further He declined returning back to [facility] for post transplant follow-up  History of skin cancer He had recent diagnosis of melanoma according to the patient I reinforced the importance of regular dermatology follow-up  S/P bone marrow transplant [facility]) He has no recent transplant follow-up He denies recent infection in his vaccination program appears to be up-to-date   No orders of the defined types were placed in this encounter.   INTERVAL HISTORY: Please see below for problem oriented charting. He has missed several appointments recently He did not have his labs done last week The patient appears upset because of his appointment scheduled today.  He thought he was supposed to get blood drawn today He denies recent infection No new bone pain He has not seen dermatologist recently despite recent diagnosis of melanoma He denies new skin lesions  SUMMARY OF ONCOLOGIC HISTORY: Oncology History   IgG Kappa light  chain disease, Durie-Salmon stage III     Multiple myeloma in remission ([place])   01/19/2012 Imaging    The patient has CT scan of the head for evaluation of severe headaches which show abnormalities in the sinus      01/22/2012 Bone Marrow Biopsy    Bone marrow biopsy show only 2% plasma cell      01/22/2012 Imaging    CT scan of the sinus showed Ethmoid mucocele with some expansion of the air cells and soft tissue extending into the upper nasal cavity.       01/26/2012 Procedure    Biopsy of the sinus mass confirmed plasmacytoma      02/11/2012 Imaging    PET scan showed Expansile soft tissue in the right ethmoid sinus and sphenoid sinuses has an S U V max of 24.6.  No additional areas of abnormalities elsewhere.        05/20/2012 Imaging    Repeat CT scan of the sinuses show complete response to treatment      03/03/2013 Imaging    CT scan of the chest showed fracture of the left seventh rib represents a pathologic fracture.  There is a destructive soft tissue mass at the site of the fracture worrisome for malignancy.       03/10/2013 Bone Marrow Biopsy    Repeat bone marrow biopsy again initial 2% plasma cell      03/17/2013 Imaging    Repeat PET scan showed numerous lytic myelomatous bone lesions involving the spine, left ribs and left iliac bone. There were also two pancreatic lesions and possibly three liver lesions suspicious for metastatic disease.        03/21/2013 Procedure  Biopsy of the rib lesion came back plasmacytoma      03/24/2013 Procedure    Fine-needle aspirate and biopsy of the pancreatic mass confirmed plasma cell       06/09/2013 - 08/09/2013 Chemotherapy    The patient completed 4 months of induction chemotherapy with Revlimid, Velcade and dexamethasone along with Zometa      08/21/2013 Imaging    Repeat PET scan at [facility] show persistent lytic lesions but there were no abnormalities in the pancreas or liver      09/28/2013 Bone Marrow Transplant    The  patient underwent autologous stem cell rescue after conditioning therapy with melphalan      02/26/2015 Miscellaneous    Established oncology care at  [facility]      02/26/2015 Miscellaneous    Non-complicance with appointments: No show on: 03/29/2015, 04/03/2015, 04/25/2015.       REVIEW OF SYSTEMS:   Constitutional: Denies fevers, chills or abnormal weight loss Eyes: Denies blurriness of vision Ears, nose, mouth, throat, and face: Denies mucositis or sore throat Respiratory: Denies cough, dyspnea or wheezes Cardiovascular: Denies palpitation, chest discomfort or lower extremity swelling Gastrointestinal:  Denies nausea, heartburn or change in bowel habits Skin: Denies abnormal skin rashes Lymphatics: Denies new lymphadenopathy or easy bruising Neurological:Denies numbness, tingling or new weaknesses Behavioral/Psych: Mood is stable, no new changes  All other systems were reviewed with the patient and are negative.  I have reviewed the past medical history, past surgical history, social history and family history with the patient and they are unchanged from previous note.  ALLERGIES:  has No Known Allergies.  MEDICATIONS:  Current Outpatient Medications  Medication Sig Dispense Refill  . escitalopram (LEXAPRO) 20 MG tablet Take 1 tablet (20 mg total) by mouth daily. 90 tablet 3  . ibuprofen (ADVIL,MOTRIN) 200 MG tablet Take 200 mg by mouth every 6 (six) hours as needed.    . lansoprazole (PREVACID) 15 MG capsule Take 15 mg by mouth daily.    Marland Kitchen loratadine (CLARITIN) 10 MG tablet Take 10 mg by mouth daily.    Marland Kitchen LORazepam (ATIVAN) 1 MG tablet Take 1 tablet (1 mg total) by mouth 2 (two) times daily as needed for anxiety. 20 tablet 0   No current facility-administered medications for this visit.     PHYSICAL EXAMINATION: ECOG PERFORMANCE STATUS: 0 - Asymptomatic  Vitals:   12/09/17 1207  BP: 115/77  Pulse: 62  Resp: 18  Temp: 97.7 F (36.5 C)  SpO2: 100%    Filed Weights   12/09/17 1207  Weight: 273 lb 12.8 oz (124.2 kg)    GENERAL:alert, no distress and comfortable SKIN: skin color, texture, turgor are normal, no rashes or significant lesions EYES: normal, Conjunctiva are pink and non-injected, sclera clear OROPHARYNX:no exudate, no erythema and lips, buccal mucosa, and tongue normal  NECK: supple, thyroid normal size, non-tender, without nodularity LYMPH:  no palpable lymphadenopathy in the cervical, axillary or inguinal LUNGS: clear to auscultation and percussion with normal breathing effort HEART: regular rate & rhythm and no murmurs and no lower extremity edema ABDOMEN:abdomen soft, non-tender and normal bowel sounds Musculoskeletal:no cyanosis of digits and no clubbing  NEURO: alert & oriented x 3 with fluent speech, no focal motor/sensory deficits  LABORATORY DATA:  I have reviewed the data as listed    Component Value Date/Time   NA 138 06/03/2017 1336   K 4.0 06/03/2017 1336   CL 109 01/27/2017 0439   CL 105 03/30/2013  1340   CO2 27 06/03/2017 1336   GLUCOSE 107 06/03/2017 1336   GLUCOSE 120 (H) 03/30/2013 1340   BUN 10.5 06/03/2017 1336   CREATININE 1.2 06/03/2017 1336   CALCIUM 9.5 06/03/2017 1336   PROT 7.1 06/03/2017 1336   PROT 6.6 06/03/2017 1336   ALBUMIN 3.8 06/03/2017 1336   AST 32 06/03/2017 1336   ALT 38 06/03/2017 1336   ALKPHOS 80 06/03/2017 1336   BILITOT 0.75 06/03/2017 1336   GFRNONAA >60 01/27/2017 0439   GFRAA >60 01/27/2017 0439    No results found for: SPEP, UPEP  Lab Results  Component Value Date   WBC 3.8 (L) 06/03/2017   NEUTROABS 2.8 06/03/2017   HGB 13.1 06/03/2017   HCT 37.6 (L) 06/03/2017   MCV 98.6 (H) 06/03/2017   PLT 113 (L) 06/03/2017      Chemistry      Component Value Date/Time   NA 138 06/03/2017 1336   K 4.0 06/03/2017 1336   CL 109 01/27/2017 0439   CL 105 03/30/2013 1340   CO2 27 06/03/2017 1336   BUN 10.5 06/03/2017 1336   CREATININE 1.2 06/03/2017 1336       Component Value Date/Time   CALCIUM 9.5 06/03/2017 1336   ALKPHOS 80 06/03/2017 1336   AST 32 06/03/2017 1336   ALT 38 06/03/2017 1336   BILITOT 0.75 06/03/2017 1336      All questions were answered. The patient knows to call the clinic with any problems, questions or concerns. No barriers to learning was detected.  I spent 15 minutes counseling the patient face to face. The total time spent in the appointment was 20 minutes and more than 50% was on counseling and review of test results  Heath Lark, MD 12/09/2017 12:19 PM

## 2017-12-09 NOTE — Telephone Encounter (Signed)
Patient declined AVS and calendar of upcoming august appointments.

## 2017-12-09 NOTE — Assessment & Plan Note (Signed)
The patient had multiple no-show and rescheduled appointment The patient appears upset because he thought he was supposed to get blood drawn today I informed him that he missed his blood appointment last week After much discussion, he is in agreement to proceed with blood draw today I will call him with test results I recommend seeing him back in 6 months until he has reached the 5 years post transplant date before spacing out his appointment further He declined returning back to [facility] for post transplant follow-up

## 2017-12-09 NOTE — Assessment & Plan Note (Signed)
He had recent diagnosis of melanoma according to the patient I reinforced the importance of regular dermatology follow-up

## 2017-12-10 LAB — KAPPA/LAMBDA LIGHT CHAINS
KAPPA, LAMDA LIGHT CHAIN RATIO: 1.17 (ref 0.26–1.65)
Kappa free light chain: 24 mg/L — ABNORMAL HIGH (ref 3.3–19.4)
Lambda free light chains: 20.5 mg/L (ref 5.7–26.3)

## 2017-12-14 LAB — MULTIPLE MYELOMA PANEL, SERUM
ALBUMIN SERPL ELPH-MCNC: 3.9 g/dL (ref 2.9–4.4)
ALPHA 1: 0.2 g/dL (ref 0.0–0.4)
ALPHA2 GLOB SERPL ELPH-MCNC: 0.7 g/dL (ref 0.4–1.0)
Albumin/Glob SerPl: 1.3 (ref 0.7–1.7)
B-GLOBULIN SERPL ELPH-MCNC: 1.4 g/dL — AB (ref 0.7–1.3)
Gamma Glob SerPl Elph-Mcnc: 0.9 g/dL (ref 0.4–1.8)
Globulin, Total: 3.1 g/dL (ref 2.2–3.9)
IGG (IMMUNOGLOBIN G), SERUM: 830 mg/dL (ref 700–1600)
IgA: 478 mg/dL — ABNORMAL HIGH (ref 90–386)
IgM (Immunoglobulin M), Srm: 88 mg/dL (ref 20–172)
TOTAL PROTEIN ELP: 7 g/dL (ref 6.0–8.5)

## 2017-12-15 ENCOUNTER — Telehealth: Payer: Self-pay

## 2017-12-15 NOTE — Telephone Encounter (Signed)
Called with below message. 

## 2017-12-15 NOTE — Telephone Encounter (Signed)
-----   Message from Heath Lark, MD sent at 12/15/2017  8:36 AM EST ----- Regarding: labs PLs let him know labs showed remission Please remind him that labs will be drawn 1 week before his future appt ----- Message ----- From: Interface, Lab In [place] Sent: 12/09/2017  12:39 PM To: Heath Lark, MD

## 2018-02-04 DIAGNOSIS — M5410 Radiculopathy, site unspecified: Secondary | ICD-10-CM | POA: Diagnosis not present

## 2018-02-04 DIAGNOSIS — M545 Low back pain: Secondary | ICD-10-CM | POA: Diagnosis not present

## 2018-02-04 DIAGNOSIS — Z1389 Encounter for screening for other disorder: Secondary | ICD-10-CM | POA: Diagnosis not present

## 2018-02-04 DIAGNOSIS — Z6832 Body mass index (BMI) 32.0-32.9, adult: Secondary | ICD-10-CM | POA: Diagnosis not present

## 2018-02-04 DIAGNOSIS — E6609 Other obesity due to excess calories: Secondary | ICD-10-CM | POA: Diagnosis not present

## 2018-03-31 DIAGNOSIS — E6609 Other obesity due to excess calories: Secondary | ICD-10-CM | POA: Diagnosis not present

## 2018-03-31 DIAGNOSIS — F419 Anxiety disorder, unspecified: Secondary | ICD-10-CM | POA: Diagnosis not present

## 2018-03-31 DIAGNOSIS — Z6832 Body mass index (BMI) 32.0-32.9, adult: Secondary | ICD-10-CM | POA: Diagnosis not present

## 2018-06-02 ENCOUNTER — Inpatient Hospital Stay: Payer: BLUE CROSS/BLUE SHIELD | Attending: Hematology and Oncology

## 2018-06-02 ENCOUNTER — Other Ambulatory Visit: Payer: Self-pay | Admitting: Hematology and Oncology

## 2018-06-02 DIAGNOSIS — Z85828 Personal history of other malignant neoplasm of skin: Secondary | ICD-10-CM | POA: Diagnosis not present

## 2018-06-02 DIAGNOSIS — C9001 Multiple myeloma in remission: Secondary | ICD-10-CM

## 2018-06-02 DIAGNOSIS — D61818 Other pancytopenia: Secondary | ICD-10-CM | POA: Insufficient documentation

## 2018-06-02 DIAGNOSIS — E559 Vitamin D deficiency, unspecified: Secondary | ICD-10-CM | POA: Insufficient documentation

## 2018-06-02 DIAGNOSIS — R252 Cramp and spasm: Secondary | ICD-10-CM | POA: Diagnosis not present

## 2018-06-02 DIAGNOSIS — Z79899 Other long term (current) drug therapy: Secondary | ICD-10-CM | POA: Diagnosis not present

## 2018-06-02 DIAGNOSIS — E538 Deficiency of other specified B group vitamins: Secondary | ICD-10-CM | POA: Insufficient documentation

## 2018-06-02 DIAGNOSIS — R7989 Other specified abnormal findings of blood chemistry: Secondary | ICD-10-CM | POA: Insufficient documentation

## 2018-06-02 LAB — CBC WITH DIFFERENTIAL/PLATELET
Basophils Absolute: 0 10*3/uL (ref 0.0–0.1)
Basophils Relative: 1 %
Eosinophils Absolute: 0.1 10*3/uL (ref 0.0–0.5)
Eosinophils Relative: 2 %
HCT: 36.2 % — ABNORMAL LOW (ref 38.4–49.9)
HEMOGLOBIN: 12.7 g/dL — AB (ref 13.0–17.1)
LYMPHS ABS: 0.8 10*3/uL — AB (ref 0.9–3.3)
LYMPHS PCT: 21 %
MCH: 34.1 pg — AB (ref 27.2–33.4)
MCHC: 35.1 g/dL (ref 32.0–36.0)
MCV: 97.3 fL (ref 79.3–98.0)
Monocytes Absolute: 0.3 10*3/uL (ref 0.1–0.9)
Monocytes Relative: 7 %
NEUTROS PCT: 69 %
Neutro Abs: 2.6 10*3/uL (ref 1.5–6.5)
Platelets: 91 10*3/uL — ABNORMAL LOW (ref 140–400)
RBC: 3.72 MIL/uL — AB (ref 4.20–5.82)
RDW: 12.4 % (ref 11.0–14.6)
WBC: 3.8 10*3/uL — AB (ref 4.0–10.3)

## 2018-06-02 LAB — COMPREHENSIVE METABOLIC PANEL
ALK PHOS: 65 U/L (ref 38–126)
ALT: 41 U/L (ref 0–44)
AST: 30 U/L (ref 15–41)
Albumin: 3.9 g/dL (ref 3.5–5.0)
Anion gap: 8 (ref 5–15)
BUN: 15 mg/dL (ref 6–20)
CALCIUM: 9.5 mg/dL (ref 8.9–10.3)
CO2: 26 mmol/L (ref 22–32)
CREATININE: 1.42 mg/dL — AB (ref 0.61–1.24)
Chloride: 106 mmol/L (ref 98–111)
GFR, EST NON AFRICAN AMERICAN: 55 mL/min — AB (ref >60)
Glucose, Bld: 104 mg/dL — ABNORMAL HIGH (ref 70–99)
Potassium: 3.6 mmol/L (ref 3.5–5.1)
Sodium: 140 mmol/L (ref 135–145)
Total Bilirubin: 0.5 mg/dL (ref 0.3–1.2)
Total Protein: 6.9 g/dL (ref 6.5–8.1)

## 2018-06-03 LAB — KAPPA/LAMBDA LIGHT CHAINS
Kappa free light chain: 26.9 mg/L — ABNORMAL HIGH (ref 3.3–19.4)
Kappa, lambda light chain ratio: 1.07 (ref 0.26–1.65)
Lambda free light chains: 25.2 mg/L (ref 5.7–26.3)

## 2018-06-04 LAB — MULTIPLE MYELOMA PANEL, SERUM
ALBUMIN/GLOB SERPL: 1.4 (ref 0.7–1.7)
ALPHA2 GLOB SERPL ELPH-MCNC: 0.7 g/dL (ref 0.4–1.0)
Albumin SerPl Elph-Mcnc: 3.8 g/dL (ref 2.9–4.4)
Alpha 1: 0.2 g/dL (ref 0.0–0.4)
B-Globulin SerPl Elph-Mcnc: 1.3 g/dL (ref 0.7–1.3)
Gamma Glob SerPl Elph-Mcnc: 0.7 g/dL (ref 0.4–1.8)
Globulin, Total: 2.9 g/dL (ref 2.2–3.9)
IGG (IMMUNOGLOBIN G), SERUM: 703 mg/dL (ref 700–1600)
IgA: 425 mg/dL — ABNORMAL HIGH (ref 90–386)
IgM (Immunoglobulin M), Srm: 97 mg/dL (ref 20–172)
Total Protein ELP: 6.7 g/dL (ref 6.0–8.5)

## 2018-06-09 ENCOUNTER — Inpatient Hospital Stay (HOSPITAL_BASED_OUTPATIENT_CLINIC_OR_DEPARTMENT_OTHER): Payer: BLUE CROSS/BLUE SHIELD | Admitting: Hematology and Oncology

## 2018-06-09 ENCOUNTER — Telehealth: Payer: Self-pay | Admitting: Hematology and Oncology

## 2018-06-09 VITALS — BP 112/67 | HR 74 | Temp 97.8°F | Resp 18 | Ht 77.0 in | Wt 275.8 lb

## 2018-06-09 DIAGNOSIS — R252 Cramp and spasm: Secondary | ICD-10-CM | POA: Diagnosis not present

## 2018-06-09 DIAGNOSIS — D61818 Other pancytopenia: Secondary | ICD-10-CM | POA: Diagnosis not present

## 2018-06-09 DIAGNOSIS — Z85828 Personal history of other malignant neoplasm of skin: Secondary | ICD-10-CM

## 2018-06-09 DIAGNOSIS — C9001 Multiple myeloma in remission: Secondary | ICD-10-CM | POA: Diagnosis not present

## 2018-06-09 DIAGNOSIS — Z79899 Other long term (current) drug therapy: Secondary | ICD-10-CM | POA: Diagnosis not present

## 2018-06-09 DIAGNOSIS — E538 Deficiency of other specified B group vitamins: Secondary | ICD-10-CM

## 2018-06-09 DIAGNOSIS — E559 Vitamin D deficiency, unspecified: Secondary | ICD-10-CM

## 2018-06-09 DIAGNOSIS — R7989 Other specified abnormal findings of blood chemistry: Secondary | ICD-10-CM | POA: Diagnosis not present

## 2018-06-09 NOTE — Telephone Encounter (Signed)
Gave avs and calendar ° °

## 2018-06-10 ENCOUNTER — Encounter: Payer: Self-pay | Admitting: Hematology and Oncology

## 2018-06-10 DIAGNOSIS — R252 Cramp and spasm: Secondary | ICD-10-CM | POA: Insufficient documentation

## 2018-06-10 DIAGNOSIS — R7989 Other specified abnormal findings of blood chemistry: Secondary | ICD-10-CM | POA: Insufficient documentation

## 2018-06-10 NOTE — Assessment & Plan Note (Signed)
I reviewed myeloma panel with the patient Currently, he has no signs of disease recurrence His pancytopenia is unlikely to be related He declined returning back to [facility] for post transplant follow-up From the myeloma standpoint, I plan to see him once a year with history, physical examination and blood work The patient is educated to watch out for signs and symptoms of cancer recurrence We discussed the importance of influenza vaccination

## 2018-06-10 NOTE — Assessment & Plan Note (Signed)
He denies recurrence of skin cancer We discussed the importance of aggressive skin protection and surveillance by dermatologist

## 2018-06-10 NOTE — Assessment & Plan Note (Signed)
He has acute intermittent elevated serum creatinine Could be a component of dehydration It is the most likely cause of mild elevated serum light chain I recommend aggressive fluid hydration

## 2018-06-10 NOTE — Progress Notes (Signed)
[place] OFFICE PROGRESS NOTE  Patient Care Team: Sharilyn Sites, MD as PCP - General (Family Medicine) Jeanann Lewandowsky, MD as PCP - Hematology/Oncology (Hematology and Oncology) Melissa Montane, MD as Attending Physician (Otolaryngology) Eppie Gibson, MD (Radiation Oncology) Elayne Snare, MD as Attending Physician (Endocrinology) Carol Ada, MD as Attending Physician (Gastroenterology) Heath Lark, MD as Consulting Physician (Hematology and Oncology)  ASSESSMENT & PLAN:  Multiple myeloma in remission [facility]) I reviewed myeloma panel with the patient Currently, he has no signs of disease recurrence His pancytopenia is unlikely to be related He declined returning back to [facility] for post transplant follow-up From the myeloma standpoint, I plan to see him once a year with history, physical examination and blood work The patient is educated to watch out for signs and symptoms of cancer recurrence We discussed the importance of influenza vaccination  Pancytopenia, acquired [facility]) He has acquired pancytopenia likely due to alcoholism We discussed the importance of omission of alcohol intake I plan to recheck it next month and he agreed I plan to order additional studies to exclude mineral deficiency  History of skin cancer He denies recurrence of skin cancer We discussed the importance of aggressive skin protection and surveillance by dermatologist  Elevated serum creatinine He has acute intermittent elevated serum creatinine Could be a component of dehydration It is the most likely cause of mild elevated serum light chain I recommend aggressive fluid hydration  Bilateral leg cramps He has chronic intermittent bilateral leg cramps I plan to order serum magnesium and vitamin D level in his next blood draw   Orders Placed This Encounter  Procedures  . CBC with Differential/Platelet    Standing Status:   Future    Standing Expiration Date:   07/14/2019  . Vitamin  B12    Standing Status:   Future    Standing Expiration Date:   07/14/2019  . Sedimentation rate    Standing Status:   Future    Standing Expiration Date:   07/14/2019  . Iron and TIBC    Standing Status:   Future    Standing Expiration Date:   07/14/2019  . Ferritin    Standing Status:   Future    Standing Expiration Date:   07/14/2019  . VITAMIN D 25 Hydroxy (Vit-D Deficiency, Fractures)    Standing Status:   Future    Standing Expiration Date:   07/14/2019  . Magnesium    Standing Status:   Future    Standing Expiration Date:   07/14/2019    INTERVAL HISTORY: Please see below for problem oriented charting. He returns for further follow-up He denies recent infection, fever or chills The patient denies any recent signs or symptoms of bleeding such as spontaneous epistaxis, hematuria or hematochezia. He drinks alcohol on a regular basis He complained of recent leg cramps He is gaining weight He denies abnormal bone pain  SUMMARY OF ONCOLOGIC HISTORY: Oncology History   IgG Kappa light chain disease, Durie-Salmon stage III     Multiple myeloma in remission ([place])   01/19/2012 Imaging    The patient has CT scan of the head for evaluation of severe headaches which show abnormalities in the sinus    01/22/2012 Bone Marrow Biopsy    Bone marrow biopsy show only 2% plasma cell    01/22/2012 Imaging    CT scan of the sinus showed Ethmoid mucocele with some expansion of the air cells and soft tissue extending into the upper nasal cavity.  01/26/2012 Procedure    Biopsy of the sinus mass confirmed plasmacytoma    02/11/2012 Imaging    PET scan showed Expansile soft tissue in the right ethmoid sinus and sphenoid sinuses has an S U V max of 24.6.  No additional areas of abnormalities elsewhere.      05/20/2012 Imaging    Repeat CT scan of the sinuses show complete response to treatment    03/03/2013 Imaging    CT scan of the chest showed fracture of the left seventh rib represents a  pathologic fracture.  There is a destructive soft tissue mass at the site of the fracture worrisome for malignancy.     03/10/2013 Bone Marrow Biopsy    Repeat bone marrow biopsy again initial 2% plasma cell    03/17/2013 Imaging    Repeat PET scan showed numerous lytic myelomatous bone lesions involving the spine, left ribs and left iliac bone. There were also two pancreatic lesions and possibly three liver lesions suspicious for metastatic disease.      03/21/2013 Procedure    Biopsy of the rib lesion came back plasmacytoma    03/24/2013 Procedure    Fine-needle aspirate and biopsy of the pancreatic mass confirmed plasma cell     06/09/2013 - 08/09/2013 Chemotherapy    The patient completed 4 months of induction chemotherapy with Revlimid, Velcade and dexamethasone along with Zometa    08/21/2013 Imaging    Repeat PET scan at [facility] show persistent lytic lesions but there were no abnormalities in the pancreas or liver    09/28/2013 Bone Marrow Transplant    The patient underwent autologous stem cell rescue after conditioning therapy with melphalan    02/26/2015 Miscellaneous    Established oncology care at  [facility]    02/26/2015 Miscellaneous    Non-complicance with appointments: No show on: 03/29/2015, 04/03/2015, 04/25/2015.     REVIEW OF SYSTEMS:   Constitutional: Denies fevers, chills or abnormal weight loss Eyes: Denies blurriness of vision Ears, nose, mouth, throat, and face: Denies mucositis or sore throat Respiratory: Denies cough, dyspnea or wheezes Cardiovascular: Denies palpitation, chest discomfort or lower extremity swelling Gastrointestinal:  Denies nausea, heartburn or change in bowel habits Skin: Denies abnormal skin rashes Lymphatics: Denies new lymphadenopathy or easy bruising Neurological:Denies numbness, tingling or new weaknesses Behavioral/Psych: Mood is stable, no new changes  All other systems were reviewed with the patient and are  negative.  I have reviewed the past medical history, past surgical history, social history and family history with the patient and they are unchanged from previous note.  ALLERGIES:  has No Known Allergies.  MEDICATIONS:  Current Outpatient Medications  Medication Sig Dispense Refill  . escitalopram (LEXAPRO) 20 MG tablet Take 1 tablet (20 mg total) by mouth daily. 90 tablet 3  . ibuprofen (ADVIL,MOTRIN) 200 MG tablet Take 200 mg by mouth every 6 (six) hours as needed.    . lansoprazole (PREVACID) 15 MG capsule Take 15 mg by mouth daily.    Marland Kitchen loratadine (CLARITIN) 10 MG tablet Take 10 mg by mouth daily.    Marland Kitchen LORazepam (ATIVAN) 1 MG tablet Take 1 tablet (1 mg total) by mouth 2 (two) times daily as needed for anxiety. 20 tablet 0   No current facility-administered medications for this visit.     PHYSICAL EXAMINATION: ECOG PERFORMANCE STATUS: 1 - Symptomatic but completely ambulatory  Vitals:   06/09/18 1116  BP: 112/67  Pulse: 74  Resp: 18  Temp: 97.8 F (36.6  C)  SpO2: 100%   Filed Weights   06/09/18 1116  Weight: 275 lb 12.8 oz (125.1 kg)    GENERAL:alert, no distress and comfortable SKIN: skin color, texture, turgor are normal, no rashes or significant lesions EYES: normal, Conjunctiva are pink and non-injected, sclera clear OROPHARYNX:no exudate, no erythema and lips, buccal mucosa, and tongue normal  NECK: supple, thyroid normal size, non-tender, without nodularity LYMPH:  no palpable lymphadenopathy in the cervical, axillary or inguinal LUNGS: clear to auscultation and percussion with normal breathing effort HEART: regular rate & rhythm and no murmurs and no lower extremity edema ABDOMEN:abdomen soft, non-tender and normal bowel sounds Musculoskeletal:no cyanosis of digits and no clubbing  NEURO: alert & oriented x 3 with fluent speech, no focal motor/sensory deficits  LABORATORY DATA:  I have reviewed the data as listed    Component Value Date/Time   NA 140  06/02/2018 1036   NA 138 06/03/2017 1336   K 3.6 06/02/2018 1036   K 4.0 06/03/2017 1336   CL 106 06/02/2018 1036   CL 105 03/30/2013 1340   CO2 26 06/02/2018 1036   CO2 27 06/03/2017 1336   GLUCOSE 104 (H) 06/02/2018 1036   GLUCOSE 107 06/03/2017 1336   GLUCOSE 120 (H) 03/30/2013 1340   BUN 15 06/02/2018 1036   BUN 10.5 06/03/2017 1336   CREATININE 1.42 (H) 06/02/2018 1036   CREATININE 1.2 06/03/2017 1336   CALCIUM 9.5 06/02/2018 1036   CALCIUM 9.5 06/03/2017 1336   PROT 6.9 06/02/2018 1036   PROT 7.1 06/03/2017 1336   PROT 6.6 06/03/2017 1336   ALBUMIN 3.9 06/02/2018 1036   ALBUMIN 3.8 06/03/2017 1336   AST 30 06/02/2018 1036   AST 32 06/03/2017 1336   ALT 41 06/02/2018 1036   ALT 38 06/03/2017 1336   ALKPHOS 65 06/02/2018 1036   ALKPHOS 80 06/03/2017 1336   BILITOT 0.5 06/02/2018 1036   BILITOT 0.75 06/03/2017 1336   GFRNONAA 55 (L) 06/02/2018 1036   GFRAA >60 06/02/2018 1036    No results found for: SPEP, UPEP  Lab Results  Component Value Date   WBC 3.8 (L) 06/02/2018   NEUTROABS 2.6 06/02/2018   HGB 12.7 (L) 06/02/2018   HCT 36.2 (L) 06/02/2018   MCV 97.3 06/02/2018   PLT 91 (L) 06/02/2018      Chemistry      Component Value Date/Time   NA 140 06/02/2018 1036   NA 138 06/03/2017 1336   K 3.6 06/02/2018 1036   K 4.0 06/03/2017 1336   CL 106 06/02/2018 1036   CL 105 03/30/2013 1340   CO2 26 06/02/2018 1036   CO2 27 06/03/2017 1336   BUN 15 06/02/2018 1036   BUN 10.5 06/03/2017 1336   CREATININE 1.42 (H) 06/02/2018 1036   CREATININE 1.2 06/03/2017 1336      Component Value Date/Time   CALCIUM 9.5 06/02/2018 1036   CALCIUM 9.5 06/03/2017 1336   ALKPHOS 65 06/02/2018 1036   ALKPHOS 80 06/03/2017 1336   AST 30 06/02/2018 1036   AST 32 06/03/2017 1336   ALT 41 06/02/2018 1036   ALT 38 06/03/2017 1336   BILITOT 0.5 06/02/2018 1036   BILITOT 0.75 06/03/2017 1336      All questions were answered. The patient knows to call the clinic with any  problems, questions or concerns. No barriers to learning was detected.  I spent 15 minutes counseling the patient face to face. The total time spent in the appointment was 20 minutes  and more than 50% was on counseling and review of test results  Heath Lark, MD 06/10/2018 8:42 AM

## 2018-06-10 NOTE — Assessment & Plan Note (Signed)
He has chronic intermittent bilateral leg cramps I plan to order serum magnesium and vitamin D level in his next blood draw

## 2018-06-10 NOTE — Assessment & Plan Note (Addendum)
He has acquired pancytopenia likely due to alcoholism We discussed the importance of omission of alcohol intake I plan to recheck it next month and he agreed I plan to order additional studies to exclude mineral deficiency

## 2018-06-23 ENCOUNTER — Inpatient Hospital Stay: Payer: BLUE CROSS/BLUE SHIELD | Attending: Hematology and Oncology

## 2018-06-30 DIAGNOSIS — R7989 Other specified abnormal findings of blood chemistry: Secondary | ICD-10-CM | POA: Diagnosis not present

## 2018-06-30 DIAGNOSIS — Z23 Encounter for immunization: Secondary | ICD-10-CM | POA: Diagnosis not present

## 2018-06-30 DIAGNOSIS — N529 Male erectile dysfunction, unspecified: Secondary | ICD-10-CM | POA: Diagnosis not present

## 2018-06-30 DIAGNOSIS — Z6832 Body mass index (BMI) 32.0-32.9, adult: Secondary | ICD-10-CM | POA: Diagnosis not present

## 2018-06-30 DIAGNOSIS — Z1389 Encounter for screening for other disorder: Secondary | ICD-10-CM | POA: Diagnosis not present

## 2018-07-04 DIAGNOSIS — R7989 Other specified abnormal findings of blood chemistry: Secondary | ICD-10-CM | POA: Diagnosis not present

## 2018-08-24 DIAGNOSIS — R7989 Other specified abnormal findings of blood chemistry: Secondary | ICD-10-CM | POA: Diagnosis not present

## 2018-09-23 DIAGNOSIS — Z6832 Body mass index (BMI) 32.0-32.9, adult: Secondary | ICD-10-CM | POA: Diagnosis not present

## 2018-09-23 DIAGNOSIS — F419 Anxiety disorder, unspecified: Secondary | ICD-10-CM | POA: Diagnosis not present

## 2018-09-23 DIAGNOSIS — N529 Male erectile dysfunction, unspecified: Secondary | ICD-10-CM | POA: Diagnosis not present

## 2018-09-23 DIAGNOSIS — E6609 Other obesity due to excess calories: Secondary | ICD-10-CM | POA: Diagnosis not present

## 2018-11-19 ENCOUNTER — Emergency Department (HOSPITAL_COMMUNITY): Payer: BLUE CROSS/BLUE SHIELD

## 2018-11-19 ENCOUNTER — Observation Stay (HOSPITAL_COMMUNITY)
Admission: EM | Admit: 2018-11-19 | Discharge: 2018-11-21 | Disposition: A | Discharge status: Home / Self Care | Payer: BLUE CROSS/BLUE SHIELD | Attending: Internal Medicine | Admitting: Internal Medicine

## 2018-11-19 ENCOUNTER — Other Ambulatory Visit: Payer: Self-pay

## 2018-11-19 ENCOUNTER — Encounter (HOSPITAL_COMMUNITY): Payer: Self-pay

## 2018-11-19 DIAGNOSIS — R200 Anesthesia of skin: Secondary | ICD-10-CM | POA: Diagnosis not present

## 2018-11-19 DIAGNOSIS — I10 Essential (primary) hypertension: Secondary | ICD-10-CM | POA: Diagnosis not present

## 2018-11-19 DIAGNOSIS — H538 Other visual disturbances: Secondary | ICD-10-CM | POA: Diagnosis not present

## 2018-11-19 DIAGNOSIS — F419 Anxiety disorder, unspecified: Secondary | ICD-10-CM | POA: Diagnosis present

## 2018-11-19 DIAGNOSIS — F329 Major depressive disorder, single episode, unspecified: Secondary | ICD-10-CM | POA: Diagnosis present

## 2018-11-19 DIAGNOSIS — R202 Paresthesia of skin: Secondary | ICD-10-CM

## 2018-11-19 DIAGNOSIS — Z79899 Other long term (current) drug therapy: Secondary | ICD-10-CM | POA: Diagnosis not present

## 2018-11-19 DIAGNOSIS — G459 Transient cerebral ischemic attack, unspecified: Principal | ICD-10-CM | POA: Insufficient documentation

## 2018-11-19 DIAGNOSIS — F1011 Alcohol abuse, in remission: Secondary | ICD-10-CM | POA: Diagnosis present

## 2018-11-19 DIAGNOSIS — F32A Depression, unspecified: Secondary | ICD-10-CM | POA: Diagnosis present

## 2018-11-19 DIAGNOSIS — R42 Dizziness and giddiness: Secondary | ICD-10-CM | POA: Diagnosis not present

## 2018-11-19 LAB — DIFFERENTIAL
Abs Immature Granulocytes: 0.01 10*3/uL (ref 0.00–0.07)
Basophils Absolute: 0 10*3/uL (ref 0.0–0.1)
Basophils Relative: 0 %
EOS ABS: 0.2 10*3/uL (ref 0.0–0.5)
Eosinophils Relative: 4 %
Immature Granulocytes: 0 %
Lymphocytes Relative: 24 %
Lymphs Abs: 1.1 10*3/uL (ref 0.7–4.0)
Monocytes Absolute: 0.6 10*3/uL (ref 0.1–1.0)
Monocytes Relative: 12 %
Neutro Abs: 2.8 10*3/uL (ref 1.7–7.7)
Neutrophils Relative %: 60 %

## 2018-11-19 LAB — COMPREHENSIVE METABOLIC PANEL
ALT: 41 U/L (ref 0–44)
AST: 33 U/L (ref 15–41)
Albumin: 4 g/dL (ref 3.5–5.0)
Alkaline Phosphatase: 48 U/L (ref 38–126)
Anion gap: 8 (ref 5–15)
BUN: 16 mg/dL (ref 6–20)
CO2: 25 mmol/L (ref 22–32)
Calcium: 9 mg/dL (ref 8.9–10.3)
Chloride: 105 mmol/L (ref 98–111)
Creatinine, Ser: 1.12 mg/dL (ref 0.61–1.24)
GFR calc Af Amer: >60 mL/min (ref >60)
GFR calc non Af Amer: >60 mL/min (ref >60)
Glucose, Bld: 94 mg/dL (ref 70–99)
Potassium: 3.7 mmol/L (ref 3.5–5.1)
Sodium: 138 mmol/L (ref 135–145)
Total Bilirubin: 0.8 mg/dL (ref 0.3–1.2)
Total Protein: 6.9 g/dL (ref 6.5–8.1)

## 2018-11-19 LAB — CBC
HCT: 38.9 % — ABNORMAL LOW (ref 39.0–52.0)
Hemoglobin: 13.2 g/dL (ref 13.0–17.0)
MCH: 32.1 pg (ref 26.0–34.0)
MCHC: 33.9 g/dL (ref 30.0–36.0)
MCV: 94.6 fL (ref 80.0–100.0)
Platelets: 142 10*3/uL — ABNORMAL LOW (ref 150–400)
RBC: 4.11 MIL/uL — ABNORMAL LOW (ref 4.22–5.81)
RDW: 12.6 % (ref 11.5–15.5)
WBC: 4.7 10*3/uL (ref 4.0–10.5)
nRBC: 0 % (ref 0.0–0.2)

## 2018-11-19 LAB — PROTIME-INR
INR: 0.96
Prothrombin Time: 12.7 seconds (ref 11.4–15.2)

## 2018-11-19 LAB — ETHANOL: Alcohol, Ethyl (B): <10 mg/dL (ref <10)

## 2018-11-19 LAB — MAGNESIUM: Magnesium: 2.1 mg/dL (ref 1.7–2.4)

## 2018-11-19 LAB — APTT: aPTT: 29 seconds (ref 24–36)

## 2018-11-19 NOTE — ED Notes (Signed)
Dr Yelverton at bedside.  

## 2018-11-19 NOTE — ED Triage Notes (Signed)
Pt presents to ED with tingling and numbness down left side along with blurry vision, started today about 1330.

## 2018-11-19 NOTE — ED Provider Notes (Signed)
[facility] EMERGENCY DEPARTMENT Provider Note   CSN: [id number] Arrival date & time: 11/19/18  2116     History   Chief Complaint Chief Complaint  Patient presents with  . Weakness    HPI Austin Griffith is a 58 y.o. male.  HPI Patient with history of multiple myeloma and anxiety.  States he has been having intermittent tingling and numbness to his left upper and lower extremities for the past week.  States he was in his normal state of health when he went to bed yesterday evening.  States he woke up around 1:00 today and had numbness and paresthesias to his left upper and lower extremities as well as paresthesias around his lips.  States he has had some blurred vision.  He is watching a basketball game this evening and became lightheaded with worsening paresthesias.  Took Ativan with little improvement.  Denies any focal weakness.  No speech changes. Past Medical History:  Diagnosis Date  . Acid reflux   . Anxiety 07/05/2013  . AVN (avascular necrosis of bone) (H[facility])    bilateral hips; s/p hip replacement in 1991 and 2009  . Blurred vision    Present for "many years"  . Bone metastases ([place]) 03/31/13    MR Abdomen -Lower Thoracic and Upper Lumbar spine  . Depression 11/22/2013  . Epistaxis 02/08/2012  . Extramedullary plasmacytoma in relapse ([place]) 04/10/2013  . Fatigue   . Fatigue 05/25/12   "Mild"  . Headache(784.0) 02/08/2012   Right Sided Headache Behind Right Eye  . History of radiation therapy 02/29/12-04/08/12   right  ethmoid sinus in 50.4Gy in 20 fxs  . Hyperlipidemia    diet control   . Hypertension   . Knee pain 10/18/2013  . Liver lesion 03/31/13   MR Abdomen  . Medically noncompliant 09/29/2015  . Multiple myeloma in remission ([place]) 11/22/2013  . Multiple myeloma, in relapse 07/05/2013  . Nausea alone 09/12/2013  . Neutropenic fever ([place]) 09/18/2013  . On antineoplastic chemotherapy started 04/08/13   Revlimid/ Velcade/ Dex  . Pancreatic lesion 03/31/13    MR  Abdomen - Ucinate Process  . Plasmacytoma ([place])     Right Ethmoid Sinus; bone marrow biopsy on 03/10/13 showed normal myeloma FISH panel and cytogenetics.   . S/P radiation therapy 03/28/2013   Left posterior 7th Rib / 8Gy in 1 fraction  . Shingles Feb 24, 2015  . Thrombocytopenia ([place]) 05/25/12    Patient Active Problem List   Diagnosis Date Noted  . Elevated serum creatinine 06/10/2018  . Bilateral leg cramps 06/10/2018  . Pancytopenia, acquired ([place]) 06/10/2017  . Dehydration 01/26/2017  . Hypotension 01/26/2017  . Thrombocytopenia ([place]) 01/26/2017  . Leukopenia 01/26/2017  . Acute renal insufficiency 01/26/2017  . Acute bronchitis 01/26/2017  . Syncope 01/26/2017  . Orthostatic hypotension 01/26/2017  . Epistaxis 01/26/2017  . History of ETOH abuse 01/26/2017  . Hematuria, gross 10/30/2016  . History of skin cancer 10/30/2016  . Medically noncompliant 09/29/2015  . Depression 11/22/2013  . S/P bone marrow transplant ([place]) 09/29/2013  . Multiple myeloma in remission ([place]) 07/05/2013  . Anxiety 07/05/2013  . Adrenal insufficiency ([place]) 07/22/2012  . Hypertension     Past Surgical History:  Procedure Laterality Date  . Bilateral hip replacement  1991; and 2009   due to bilateral hip AVN's. / reports left hip was not replaced  . EUS N/A 03/24/2013   Procedure: UPPER ENDOSCOPIC ULTRASOUND (EUS) LINEAR;  Surgeon: Beryle Beams, MD;  Location: [facility];  Service: Endoscopy;  Laterality: N/A;  . FINE NEEDLE ASPIRATION N/A 03/24/2013   Procedure: FINE NEEDLE ASPIRATION (FNA) LINEAR;  Surgeon: Beryle Beams, MD;  Location: [facility] ENDOSCOPY;  Service: Endoscopy;  Laterality: N/A;  . MASS BIOPSY  01/26/12   Right Ethmoid Mass - Plasma Cell Neoplasm        Home Medications    Prior to Admission medications   Medication Sig Start Date End Date Taking? Authorizing Provider  escitalopram (LEXAPRO) 20 MG tablet Take 1 tablet (20 mg total) by mouth daily. 11/22/13  Yes Gorsuch, Ni,  MD  ibuprofen (ADVIL,MOTRIN) 200 MG tablet Take 200 mg by mouth every 6 (six) hours as needed.   Yes [provider]  lansoprazole (PREVACID) 15 MG capsule Take 15 mg by mouth daily.   Yes [provider]  loratadine (CLARITIN) 10 MG tablet Take 10 mg by mouth daily.   Yes [provider]  LORazepam (ATIVAN) 1 MG tablet Take 1 tablet (1 mg total) by mouth 2 (two) times daily as needed for anxiety. 09/17/16  Yes Evalee Jefferson, PA-C    Family History Family History  Adopted: Yes    Social History Social History   Tobacco Use  . Smoking status: Never Smoker  . Smokeless tobacco: Never Used  Substance Use Topics  . Alcohol use: Yes    Comment: Hx of Alcohol Abuse.09/18/13 denies but says drinks etoh  . Drug use: No     Allergies   Patient has no known allergies.   Review of Systems Review of Systems  Constitutional: Negative for chills and fever.  HENT: Negative for sore throat and trouble swallowing.   Eyes: Positive for visual disturbance. Negative for photophobia and pain.  Respiratory: Negative for cough and shortness of breath.   Cardiovascular: Negative for chest pain, palpitations and leg swelling.  Gastrointestinal: Negative for abdominal pain, constipation, diarrhea, nausea and vomiting.  Musculoskeletal: Negative for back pain, myalgias and neck pain.  Skin: Negative for rash and wound.  Neurological: Positive for dizziness, light-headedness and numbness. Negative for syncope, speech difficulty, weakness and headaches.  Psychiatric/Behavioral: The patient is nervous/anxious.   All other systems reviewed and are negative.    Physical Exam Updated Vital Signs BP 137/87   Pulse (!) 59   Temp 97.8 F (36.6 C) (Oral)   Resp 20   SpO2 95%   Physical Exam Vitals signs and nursing note reviewed.  Constitutional:      General: He is not in acute distress.    Appearance: Normal appearance. He is well-developed. He is not ill-appearing.      Comments: Anxious appearing  HENT:     Head: Normocephalic and atraumatic.     Comments: No facial asymmetry.  Patient has decreased sensation to light touch the left lower face.    Nose: Nose normal.     Mouth/Throat:     Mouth: Mucous membranes are moist.  Eyes:     Extraocular Movements: Extraocular movements intact.     Pupils: Pupils are equal, round, and reactive to light.     Comments: No nystagmus  Neck:     Musculoskeletal: Normal range of motion and neck supple. No neck rigidity or muscular tenderness.  Cardiovascular:     Rate and Rhythm: Normal rate and regular rhythm.     Pulses: Normal pulses.     Heart sounds: No murmur. No friction rub. No gallop.   Pulmonary:     Effort: Pulmonary effort is normal. No  respiratory distress.     Breath sounds: Normal breath sounds. No stridor. No wheezing, rhonchi or rales.  Chest:     Chest wall: No tenderness.  Abdominal:     General: Bowel sounds are normal.     Palpations: Abdomen is soft.     Tenderness: There is no abdominal tenderness. There is no guarding or rebound.  Musculoskeletal: Normal range of motion.        General: No swelling, tenderness, deformity or signs of injury.     Right lower leg: No edema.  Lymphadenopathy:     Cervical: No cervical adenopathy.  Skin:    General: Skin is warm and dry.     Findings: No erythema or rash.  Neurological:     General: No focal deficit present.     Mental Status: He is alert and oriented to person, place, and time.     Sensory: Sensory deficit present.     Motor: No weakness.     Comments: Patient is alert and oriented x3 with clear, goal oriented speech. Patient has 5/5 motor in all extremities. Bilateral finger-to-nose is normal with no signs of dysmetria.  Diminished sensation to light touch in the left upper and left lower extremities.  Psychiatric:        Behavior: Behavior normal.      ED Treatments / Results  Labs (all labs ordered are listed, but only  abnormal results are displayed) Labs Reviewed  CBC - Abnormal; Notable for the following components:      Result Value   RBC 4.11 (*)    HCT 38.9 (*)    Platelets 142 (*)    All other components within normal limits  ETHANOL  PROTIME-INR  APTT  DIFFERENTIAL  COMPREHENSIVE METABOLIC PANEL  MAGNESIUM  RAPID URINE DRUG SCREEN, HOSP PERFORMED  URINALYSIS, ROUTINE W REFLEX MICROSCOPIC    EKG EKG Interpretation  Date/Time:  Saturday November 19 2018 21:34:00 EST Ventricular Rate:  64 PR Interval:    QRS Duration: 101 QT Interval:  423 QTC Calculation: 437 R Axis:   35 Text Interpretation:  Sinus rhythm Borderline T abnormalities, anterior leads Confirmed by Julianne Rice [phone]) on 11/19/2018 11:43:12 PM   Radiology Ct Head Wo Contrast  Result Date: 11/19/2018 CLINICAL DATA:  Focal neural deficit of more than 6 hours, suspected stroke, presents with tingling and numbness down LEFT side along with blurred vision since 1330 hours today, intermittent dizziness and LEFT-sided weakness when standing, history multiple myeloma, hypertension at EXAM: CT HEAD WITHOUT CONTRAST TECHNIQUE: Contiguous axial images were obtained from the base of the skull through the vertex without intravenous contrast. Sagittal and coronal MPR images reconstructed from axial data set. COMPARISON:  01/19/2012 FINDINGS: Brain: Generalized atrophy. Normal ventricular morphology. No midline shift or mass effect. Normal appearance of brain parenchyma. No intracranial hemorrhage, mass lesion, or evidence of acute infarction. No extra-axial fluid collections. Vascular: No hyperdense vessels Skull: Intact without focal lesion Sinuses/Orbits: Mucosal thickening in sphenoid sinus. Other: N/A IMPRESSION: Generalized atrophy. No acute intracranial abnormalities. Electronically Signed   By: Lavonia Dana M.D.   On: 11/19/2018 22:39    Procedures Procedures (including critical care time)  Medications Ordered in ED Medications  - No data to display   Initial Impression / Assessment and Plan / ED Course  I have reviewed the triage vital signs and the nursing notes.  Pertinent labs & imaging results that were available during my care of the patient were reviewed by me and considered in  my medical decision making (see chart for details).    Symptoms have significantly improved though he still has some numbness to the left foot.  Discussed with Dr. Leonel Ramsay.  Recommends admission for stroke work-up.  Patient states that he is unable to tolerate closed MRI even with anxiolytics and sedation.  Spoke with Dr. Darrick Meigs.  Recommends tele-neurologist evaluation.  Consult is been placed for the tele-neurologist.   Spoke with tele-neurologist.  He is recommending admission for stroke work-up including carotid Dopplers and echo and repeat CT head tomorrow.  If symptoms are improving and no acute findings can be discharged home to have open MRI as an outpatient.  Hospitalist to admit. Final Clinical Impressions(s) / ED Diagnoses   Final diagnoses:  Paresthesia    ED Discharge Orders    None       Julianne Rice, MD 11/20/18 [phone]

## 2018-11-20 ENCOUNTER — Other Ambulatory Visit: Payer: Self-pay

## 2018-11-20 ENCOUNTER — Encounter (HOSPITAL_COMMUNITY): Payer: Self-pay | Admitting: *Deleted

## 2018-11-20 ENCOUNTER — Observation Stay (HOSPITAL_BASED_OUTPATIENT_CLINIC_OR_DEPARTMENT_OTHER): Payer: BLUE CROSS/BLUE SHIELD

## 2018-11-20 ENCOUNTER — Observation Stay (HOSPITAL_COMMUNITY): Payer: BLUE CROSS/BLUE SHIELD

## 2018-11-20 DIAGNOSIS — G459 Transient cerebral ischemic attack, unspecified: Secondary | ICD-10-CM | POA: Diagnosis not present

## 2018-11-20 DIAGNOSIS — R202 Paresthesia of skin: Secondary | ICD-10-CM | POA: Diagnosis not present

## 2018-11-20 DIAGNOSIS — I6523 Occlusion and stenosis of bilateral carotid arteries: Secondary | ICD-10-CM | POA: Diagnosis not present

## 2018-11-20 LAB — URINALYSIS, ROUTINE W REFLEX MICROSCOPIC
BILIRUBIN URINE: NEGATIVE
Glucose, UA: NEGATIVE mg/dL
Ketones, ur: NEGATIVE mg/dL
Leukocytes, UA: NEGATIVE
Nitrite: NEGATIVE
Protein, ur: NEGATIVE mg/dL
Specific Gravity, Urine: 1.025 (ref 1.005–1.030)
pH: 5.5 (ref 5.0–8.0)

## 2018-11-20 LAB — LIPID PANEL
Cholesterol: 177 mg/dL (ref 0–200)
HDL: 30 mg/dL — ABNORMAL LOW (ref >40)
LDL CALC: 118 mg/dL — AB (ref 0–99)
Total CHOL/HDL Ratio: 5.9 RATIO
Triglycerides: 146 mg/dL (ref <150)
VLDL: 29 mg/dL (ref 0–40)

## 2018-11-20 LAB — HEMOGLOBIN A1C
Hgb A1c MFr Bld: 5.7 % — ABNORMAL HIGH (ref 4.8–5.6)
Mean Plasma Glucose: 116.89 mg/dL

## 2018-11-20 LAB — URINALYSIS, MICROSCOPIC (REFLEX)

## 2018-11-20 LAB — RAPID URINE DRUG SCREEN, HOSP PERFORMED
Amphetamines: NOT DETECTED
Barbiturates: NOT DETECTED
Benzodiazepines: POSITIVE — AB
Cocaine: NOT DETECTED
Opiates: NOT DETECTED
Tetrahydrocannabinol: NOT DETECTED

## 2018-11-20 LAB — ECHOCARDIOGRAM COMPLETE
Height: 77 in
Weight: 4363.34 oz

## 2018-11-20 MED ORDER — LORATADINE 10 MG PO TABS
10.0000 mg | ORAL_TABLET | Freq: Every day | ORAL | Status: DC
  Administered 2018-11-20 – 2018-11-21 (×2): 10 mg via ORAL
  Filled 2018-11-20 (×2): qty 1

## 2018-11-20 MED ORDER — ACETAMINOPHEN 325 MG PO TABS: 650.0000 mg | ORAL_TABLET | ORAL | Status: DC | PRN

## 2018-11-20 MED ORDER — STROKE: EARLY STAGES OF RECOVERY BOOK
Freq: Once | Status: AC
  Administered 2018-11-20: 04:00:00
  Filled 2018-11-20: qty 1

## 2018-11-20 MED ORDER — ENOXAPARIN SODIUM 40 MG/0.4ML ~~LOC~~ SOLN
40.0000 mg | SUBCUTANEOUS | Status: DC
  Administered 2018-11-20: 40 mg via SUBCUTANEOUS
  Filled 2018-11-20 (×2): qty 0.4

## 2018-11-20 MED ORDER — PANTOPRAZOLE SODIUM 40 MG PO TBEC
40.0000 mg | DELAYED_RELEASE_TABLET | Freq: Every day | ORAL | Status: DC
  Administered 2018-11-20 – 2018-11-21 (×2): 40 mg via ORAL
  Filled 2018-11-20 (×2): qty 1

## 2018-11-20 MED ORDER — LORAZEPAM 1 MG PO TABS
1.0000 mg | ORAL_TABLET | Freq: Two times a day (BID) | ORAL | Status: DC | PRN
  Administered 2018-11-20 – 2018-11-21 (×2): 1 mg via ORAL
  Filled 2018-11-20 (×2): qty 1

## 2018-11-20 MED ORDER — ACETAMINOPHEN 650 MG RE SUPP: 650.0000 mg | RECTAL | Status: DC | PRN

## 2018-11-20 MED ORDER — ASPIRIN 300 MG RE SUPP: 300.0000 mg | Freq: Every day | RECTAL | Status: DC

## 2018-11-20 MED ORDER — ACETAMINOPHEN 160 MG/5ML PO SOLN: 650.0000 mg | ORAL | Status: DC | PRN

## 2018-11-20 MED ORDER — ESCITALOPRAM OXALATE 10 MG PO TABS
20.0000 mg | ORAL_TABLET | Freq: Every day | ORAL | Status: DC
  Administered 2018-11-20 – 2018-11-21 (×2): 20 mg via ORAL
  Filled 2018-11-20 (×2): qty 2

## 2018-11-20 MED ORDER — ASPIRIN 325 MG PO TABS
325.0000 mg | ORAL_TABLET | Freq: Every day | ORAL | Status: DC
  Administered 2018-11-20 – 2018-11-21 (×2): 325 mg via ORAL
  Filled 2018-11-20 (×2): qty 1

## 2018-11-20 MED ORDER — SODIUM CHLORIDE 0.9 % IV SOLN: INTRAVENOUS | Status: DC

## 2018-11-20 NOTE — H&P (Signed)
TRH H&P    Patient Demographics:    Austin Griffith, is a 58 y.o. male  MRN: [id number]  DOB - Apr 14, 1961  Admit Date - 11/19/2018  Referring MD/NP/PA: Julianne Rice  Outpatient Primary MD for the patient is Sharilyn Sites, MD  Patient coming from: Home  Chief complaint-left-sided numbness   HPI:    Austin Griffith  is a 58 y.o. male, with history of anxiety, depression, multiple myeloma in remission came to hospital with complaints of left-sided numbness going on for past 1 week.  Patient says that he gets episodes where he will have numbness around the lips and tongue which would spread to the face and the left arm and leg usually these episodes will last for 2 minutes.  Resolves by laying down in the bed and gets worse on moving around.  Patient says that today the episode was not going away so he came to the hospital for further evaluation. At this time his numbness has resolved.  He denies weakness of extremities, no slurred speech, no blurred vision.  No previous history of stroke or TIA.  He does have history of anxiety/panic attacks and takes Ativan twice a day as needed.  He did take Ativan today before coming to the ED and is feeling much better. CT head was negative in the ED, patient being placed under observation for TIA work-up.  Patient cannot have MRI due to claustrophobia.  Tele neurology was consulted by ED physician, and neurologist recommended that patient have other work-up for TIA including carotid Dopplers, echo and repeat CT head tomorrow afternoon.   He denies chest pain, Denies shortness of breath. No nausea vomiting or diarrhea. No abdominal pain or dysuria.    Review of systems:    In addition to the HPI above,   All other systems reviewed and are negative.    Past History of the following :    Past Medical History:  Diagnosis Date  . Acid reflux   . Anxiety 07/05/2013  .  AVN (avascular necrosis of bone) (HCC)    bilateral hips; s/p hip replacement in 1991 and 2009  . Blurred vision    Present for "many years"  . Bone metastases ([place]) 03/31/13    MR Abdomen -Lower Thoracic and Upper Lumbar spine  . Depression 11/22/2013  . Epistaxis 02/08/2012  . Extramedullary plasmacytoma in relapse ([place]) 04/10/2013  . Fatigue   . Fatigue 05/25/12   "Mild"  . Headache(784.0) 02/08/2012   Right Sided Headache Behind Right Eye  . History of radiation therapy 02/29/12-04/08/12   right  ethmoid sinus in 50.4Gy in 20 fxs  . Hyperlipidemia    diet control   . Hypertension   . Knee pain 10/18/2013  . Liver lesion 03/31/13   MR Abdomen  . Medically noncompliant 09/29/2015  . Multiple myeloma in remission ([place]) 11/22/2013  . Multiple myeloma, in relapse 07/05/2013  . Nausea alone 09/12/2013  . Neutropenic fever ([place]) 09/18/2013  . On antineoplastic chemotherapy started 04/08/13   Revlimid/ Velcade/ Dex  . Pancreatic lesion 03/31/13  MR Abdomen - Ucinate Process  . Plasmacytoma ([place])     Right Ethmoid Sinus; bone marrow biopsy on 03/10/13 showed normal myeloma FISH panel and cytogenetics.   . S/P radiation therapy 03/28/2013   Left posterior 7th Rib / 8Gy in 1 fraction  . Shingles Feb 24, 2015  . Thrombocytopenia ([place]) 05/25/12      Past Surgical History:  Procedure Laterality Date  . Bilateral hip replacement  1991; and 2009   due to bilateral hip AVN's. / reports left hip was not replaced  . EUS N/A 03/24/2013   Procedure: UPPER ENDOSCOPIC ULTRASOUND (EUS) LINEAR;  Surgeon: Beryle Beams, MD;  Location: [facility];  Service: Endoscopy;  Laterality: N/A;  . FINE NEEDLE ASPIRATION N/A 03/24/2013   Procedure: FINE NEEDLE ASPIRATION (FNA) LINEAR;  Surgeon: Beryle Beams, MD;  Location: [facility];  Service: Endoscopy;  Laterality: N/A;  . MASS BIOPSY  01/26/12   Right Ethmoid Mass - Plasma Cell Neoplasm      Social History:      Social History   Tobacco Use  .  Smoking status: Never Smoker  . Smokeless tobacco: Never Used  Substance Use Topics  . Alcohol use: Yes    Comment: Hx of Alcohol Abuse.09/18/13 denies but says drinks etoh       Family History :     Family History  Adopted: Yes      Home Medications:   Prior to Admission medications   Medication Sig Start Date End Date Taking? Authorizing Provider  escitalopram (LEXAPRO) 20 MG tablet Take 1 tablet (20 mg total) by mouth daily. 11/22/13  Yes Gorsuch, Ni, MD  ibuprofen (ADVIL,MOTRIN) 200 MG tablet Take 200 mg by mouth every 6 (six) hours as needed.   Yes [provider]  lansoprazole (PREVACID) 15 MG capsule Take 15 mg by mouth daily.   Yes [provider]  loratadine (CLARITIN) 10 MG tablet Take 10 mg by mouth daily.   Yes [provider]  LORazepam (ATIVAN) 1 MG tablet Take 1 tablet (1 mg total) by mouth 2 (two) times daily as needed for anxiety. 09/17/16  Yes Idol, Almyra Free, PA-C     Allergies:    No Known Allergies   Physical Exam:   Vitals  Blood pressure (!) 138/94, pulse (!) 58, temperature 97.8 F (36.6 C), temperature source Oral, resp. rate 20, SpO2 99 %.  1.  General: Appears in no acute distress  2. Psychiatric: Alert, oriented x3, intact insight and judgment  3. Neurologic: Cranial nerves II through XII grossly intact, motor strength 5/5 in all extremities.  Sensations are intact bilaterally.  4. HEENMT:  Atraumatic normocephalic, extraocular muscles intact, oral mucosa is pink and moist.  5. Respiratory : Clear to auscultation bilaterally, no wheezing or crackles.  6. Cardiovascular : S1-S2, regular, no murmur auscultated.  7. Gastrointestinal:  Abdomen is soft, nontender, no organomegaly      Data Review:    CBC Recent Labs  Lab 11/19/18 2135  WBC 4.7  HGB 13.2  HCT 38.9*  PLT 142*  MCV 94.6  MCH 32.1  MCHC 33.9  RDW 12.6  LYMPHSABS 1.1  MONOABS 0.6  EOSABS 0.2  BASOSABS 0.0    ------------------------------------------------------------------------------------------------------------------  Results for orders placed or performed during the hospital encounter of 11/19/18 (from the past 48 hour(s))  Ethanol     Status: None   Collection Time: 11/19/18  9:33 PM  Result Value Ref Range   Alcohol, Ethyl (B) <10 <10 mg/dL  Comment: Performed at [facility], [address], [place], [place] [address]  Protime-INR     Status: None   Collection Time: 11/19/18  9:35 PM  Result Value Ref Range   Prothrombin Time 12.7 11.4 - 15.2 seconds   INR 0.96     Comment: Performed at [facility], [address] [place], [place] [address]  APTT     Status: None   Collection Time: 11/19/18  9:35 PM  Result Value Ref Range   aPTT 29 24 - 36 seconds    Comment: Performed at [facility], [address] [place], [place] [address]  CBC     Status: Abnormal   Collection Time: 11/19/18  9:35 PM  Result Value Ref Range   WBC 4.7 4.0 - 10.5 K/uL   RBC 4.11 (L) 4.22 - 5.81 MIL/uL   Hemoglobin 13.2 13.0 - 17.0 g/dL   HCT 38.9 (L) 39.0 - 52.0 %   MCV 94.6 80.0 - 100.0 fL   MCH 32.1 26.0 - 34.0 pg   MCHC 33.9 30.0 - 36.0 g/dL   RDW 12.6 11.5 - 15.5 %   Platelets 142 (L) 150 - 400 K/uL   nRBC 0.0 0.0 - 0.2 %    Comment: Performed at [facility], [address], [place], [place] [address]  Differential     Status: None   Collection Time: 11/19/18  9:35 PM  Result Value Ref Range   Neutrophils Relative % 60 %   Neutro Abs 2.8 1.7 - 7.7 K/uL   Lymphocytes Relative 24 %   Lymphs Abs 1.1 0.7 - 4.0 K/uL   Monocytes Relative 12 %   Monocytes Absolute 0.6 0.1 - 1.0 K/uL   Eosinophils Relative 4 %   Eosinophils Absolute 0.2 0.0 - 0.5 K/uL   Basophils Relative 0 %   Basophils Absolute 0.0 0.0 - 0.1 K/uL   Immature Granulocytes 0 %   Abs Immature Granulocytes 0.01 0.00 - 0.07 K/uL    Comment: Performed at [facility], [address] [place], [place] [address]  Comprehensive  metabolic panel     Status: None   Collection Time: 11/19/18  9:35 PM  Result Value Ref Range   Sodium 138 135 - 145 mmol/L   Potassium 3.7 3.5 - 5.1 mmol/L   Chloride 105 98 - 111 mmol/L   CO2 25 22 - 32 mmol/L   Glucose, Bld 94 70 - 99 mg/dL   BUN 16 6 - 20 mg/dL   Creatinine, Ser 1.12 0.61 - 1.24 mg/dL   Calcium 9.0 8.9 - 10.3 mg/dL   Total Protein 6.9 6.5 - 8.1 g/dL   Albumin 4.0 3.5 - 5.0 g/dL   AST 33 15 - 41 U/L   ALT 41 0 - 44 U/L   Alkaline Phosphatase 48 38 - 126 U/L   Total Bilirubin 0.8 0.3 - 1.2 mg/dL   GFR calc non Af Amer >60 >60 mL/min   GFR calc Af Amer >60 >60 mL/min   Anion gap 8 5 - 15    Comment: Performed at [facility], [address] [place], [place] [address]  Magnesium     Status: None   Collection Time: 11/19/18  9:35 PM  Result Value Ref Range   Magnesium 2.1 1.7 - 2.4 mg/dL    Comment: Performed at [facility], [address] [place],  [address]  Urine rapid drug screen (hosp performed)     Status: Abnormal   Collection Time: 11/20/18 12:45 AM  Result Value Ref Range   Opiates NONE DETECTED NONE DETECTED   Cocaine NONE DETECTED NONE DETECTED   Benzodiazepines POSITIVE (A) NONE DETECTED   Amphetamines NONE DETECTED NONE DETECTED   Tetrahydrocannabinol NONE DETECTED NONE DETECTED   Barbiturates NONE DETECTED NONE DETECTED    Comment: (NOTE) DRUG SCREEN FOR MEDICAL PURPOSES ONLY.  IF CONFIRMATION IS NEEDED FOR ANY PURPOSE, NOTIFY LAB WITHIN 5 DAYS. LOWEST DETECTABLE LIMITS FOR URINE DRUG SCREEN Drug Class                     Cutoff (ng/mL) Amphetamine and metabolites    1000 Barbiturate and metabolites    200 Benzodiazepine                 182 Tricyclics and metabolites     300 Opiates and metabolites        300 Cocaine and metabolites        300 THC                            50 Performed at [facility], [address], [place], [place] [address]   Urinalysis, Routine w reflex microscopic     Status: Abnormal   Collection Time:  11/20/18 12:45 AM  Result Value Ref Range   Color, Urine YELLOW YELLOW   APPearance CLEAR CLEAR   Specific Gravity, Urine 1.025 1.005 - 1.030   pH 5.5 5.0 - 8.0   Glucose, UA NEGATIVE NEGATIVE mg/dL   Hgb urine dipstick TRACE (A) NEGATIVE   Bilirubin Urine NEGATIVE NEGATIVE   Ketones, ur NEGATIVE NEGATIVE mg/dL   Protein, ur NEGATIVE NEGATIVE mg/dL   Nitrite NEGATIVE NEGATIVE   Leukocytes, UA NEGATIVE NEGATIVE    Comment: Performed at [facility], [address], [place], [place] [address]  Urinalysis, Microscopic (reflex)     Status: Abnormal   Collection Time: 11/20/18 12:45 AM  Result Value Ref Range   RBC / HPF 0-5 0 - 5 RBC/hpf   WBC, UA 0-5 0 - 5 WBC/hpf   Bacteria, UA RARE (A) NONE SEEN   Squamous Epithelial / LPF 0-5 0 - 5    Comment: Performed at [facility], [address], [place], [place] [address]    Chemistries  Recent Labs  Lab 11/19/18 2135  NA 138  K 3.7  CL 105  CO2 25  GLUCOSE 94  BUN 16  CREATININE 1.12  CALCIUM 9.0  MG 2.1  AST 33  ALT 41  ALKPHOS 48  BILITOT 0.8   ------------------------------------------------------------------------------------------------------------------  ------------------------------------------------------------------------------------------------------------------ GFR: CrCl cannot be calculated (Unknown ideal weight.). Liver Function Tests: Recent Labs  Lab 11/19/18 2135  AST 33  ALT 41  ALKPHOS 48  BILITOT 0.8  PROT 6.9  ALBUMIN 4.0   No results for input(s): LIPASE, AMYLASE in the last 168 hours. No results for input(s): AMMONIA in the last 168 hours. Coagulation Profile: Recent Labs  Lab 11/19/18 2135  INR 0.96    --------------------------------------------------------------------------------------------------------------- Urine analysis:    Component Value Date/Time   COLORURINE YELLOW 11/20/2018 0045   APPEARANCEUR CLEAR 11/20/2018 0045   LABSPEC 1.025 11/20/2018 0045   LABSPEC 1.030  10/30/2016 1446   PHURINE 5.5 11/20/2018 0045   GLUCOSEU NEGATIVE 11/20/2018 0045   GLUCOSEU Negative 10/30/2016 1446   HGBUR TRACE (A) 11/20/2018 0045   BILIRUBINUR NEGATIVE 11/20/2018 0045   BILIRUBINUR Negative 10/30/2016 1446   KETONESUR NEGATIVE 11/20/2018 0045   PROTEINUR NEGATIVE 11/20/2018  0045   UROBILINOGEN 0.2 10/30/2016 1446   NITRITE NEGATIVE 11/20/2018 0045   LEUKOCYTESUR NEGATIVE 11/20/2018 0045   LEUKOCYTESUR Negative 10/30/2016 1446      Imaging Results:    Ct Head Wo Contrast  Result Date: 11/19/2018 CLINICAL DATA:  Focal neural deficit of more than 6 hours, suspected stroke, presents with tingling and numbness down LEFT side along with blurred vision since 1330 hours today, intermittent dizziness and LEFT-sided weakness when standing, history multiple myeloma, hypertension at EXAM: CT HEAD WITHOUT CONTRAST TECHNIQUE: Contiguous axial images were obtained from the base of the skull through the vertex without intravenous contrast. Sagittal and coronal MPR images reconstructed from axial data set. COMPARISON:  01/19/2012 FINDINGS: Brain: Generalized atrophy. Normal ventricular morphology. No midline shift or mass effect. Normal appearance of brain parenchyma. No intracranial hemorrhage, mass lesion, or evidence of acute infarction. No extra-axial fluid collections. Vascular: No hyperdense vessels Skull: Intact without focal lesion Sinuses/Orbits: Mucosal thickening in sphenoid sinus. Other: N/A IMPRESSION: Generalized atrophy. No acute intracranial abnormalities. Electronically Signed   By: Lavonia Dana M.D.   On: 11/19/2018 22:39    My personal review of EKG: Rhythm NSR   Assessment & Plan:    Active Problems:   TIA (transient ischemic attack)   1. Left arm numbness-resolved at this time, placed under observation for TIA work-up.  Tele neurologist was consulted by ED physician, and he recommended stroke work-up.  Will obtain echocardiogram, carotid Dopplers in a.m.   Patient is claustrophobic so he cannot get MRI of the brain.  Tele neurologist recommended getting repeat CT head tomorrow afternoon and if normal patient can be discharged home with an open MRI as outpatient.  2. Anxiety/depression-patient has history of anxiety and takes Ativan twice daily as needed.  We will continue with Ativan, he did take Ativan before coming to hospital and is feeling better.  Patient says that he used to have similar episodes 15 years ago at that time Ativan helped him get over these episodes.  Continue Lexapro.  3. GERD-continue Prevacid.   DVT Prophylaxis-   Lovenox   AM Labs Ordered, also please review Full Orders  Family Communication: Admission, patients condition and plan of care including tests being ordered have been discussed with the patient  who indicate understanding and agree with the plan and Code Status.  Code Status: Full code  Admission status: Observation: Based on patients clinical presentation and evaluation of above clinical data, I have made determination that patient meets Inpatient criteria at this time.  Time spent in minutes : 60 minutes   Oswald Hillock M.D on 11/20/2018 at 1:43 AM

## 2018-11-20 NOTE — Evaluation (Signed)
Physical Therapy Evaluation Patient Details Name: Austin Griffith MRN: [id number] DOB: [birth year] Today's Date: 11/20/2018   History of Present Illness  Zechariah Bissonnette  is a 58 y.o. male, with history of anxiety, depression, multiple myeloma in remission came to hospital with complaints of left-sided numbness going on for past 1 week.  Patient says that he gets episodes where he will have numbness around the lips and tongue which would spread to the face and the left arm and leg usually these episodes will last for 2 minutes.  Resolves by laying down in the bed and gets worse on moving around.  Patient says that today the episode was not going away so he came to the hospital for further evaluation.  Clinical Impression  PT has hx of B THR therefore did not want mm testing or balance.  Pt able to stand place jeans and shoes on I.  Ambulate without assistive device.     Follow Up Recommendations No PT follow up    Equipment Recommendations  None recommended by PT    Recommendations for Other Services   none    Precautions / Restrictions Precautions Precautions: None Restrictions Weight Bearing Restrictions: No      Mobility  Bed Mobility Overal bed mobility: Independent                Transfers Overall transfer level: Independent                  Ambulation/Gait Ambulation/Gait assistance: Independent Gait Distance (Feet): 200 Feet Assistive device: None Gait Pattern/deviations: WFL(Within Functional Limits)   Gait velocity interpretation: >2.62 ft/sec, indicative of community ambulatory        Balance Overall balance assessment: (PT wound not complete SLS or tandem due to prior B THR)                                           Pertinent Vitals/Pain Pain Assessment: No/denies pain(intermittent numbness but no pain )    Home Living    lives alone                    Prior Function   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              Hand Dominance        Extremity/Trunk Assessment        Lower Extremity Assessment Lower Extremity Assessment: Overall WFL for tasks assessed       Communication      Cognition Arousal/Alertness: Awake/alert Behavior During Therapy: WFL for tasks assessed/performed Overall Cognitive Status: Within Functional Limits for tasks assessed                                        General Comments General comments (skin integrity, edema, etc.): PT able to speed up, slow down, turn head to RT and LT, up and down and turn 180 without noted deviations.         Assessment/Plan    PT Assessment Patent does not need any further PT services  PT Problem List                  Barriers to discharge    none  AM-PAC PT "6 Clicks" Mobility  Outcome Measure Help needed turning from your back to your side while in a flat bed without using bedrails?: None Help needed moving from lying on your back to sitting on the side of a flat bed without using bedrails?: None Help needed moving to and from a bed to a chair (including a wheelchair)?: None Help needed standing up from a chair using your arms (e.g., wheelchair or bedside chair)?: None Help needed to walk in hospital room?: None Help needed climbing 3-5 steps with a railing? : A Little 6 Click Score: 23    End of Session Equipment Utilized During Treatment: Gait belt Activity Tolerance: Patient tolerated treatment well Patient left: in bed Nurse Communication: Mobility status PT Visit Diagnosis: Difficulty in walking, not elsewhere classified (R26.2)    Time: 1130-1150 PT Time Calculation (min) (ACUTE ONLY): 20 min   Charges:   PT Evaluation $PT Eval Low Complexity: [place], PT CLT [phone] 11/20/2018, 11:55 AM

## 2018-11-20 NOTE — Progress Notes (Signed)
Patient admitted to the hospital earlier this morning by Dr. Darrick Meigs  Patient seen and examined.  Vitals are stable and at this point his neurologic exam is unremarkable.    He has been admitted with numbness and tingling around his lips and tongue with spreads to his left side of his face, left arm and left leg.  He also feels that his extremities get weak during this.  He has had these episodes for the past week.  Each episode lasts a few minutes.  On the day of admission, his symptoms persisted which caused him to come to the emergency room.  Initial CT scan of the brain was unrevealing.  MRI was recommended, but patient reports that due to severe claustrophobia he cannot undergo MRI unless it is an open scanner.  He wishes to proceed this as an outpatient.  Repeat CT scan was performed today that did not show any significant changes.  Echocardiogram was also unrevealing as were carotid Dopplers.  While in the hospital, patient is continued to have these episodes.  I think an EEG might be helpful.  The patient also needs to see a neurologist.  Will request neurology consultation for tomorrow.  [facility]

## 2018-11-21 ENCOUNTER — Observation Stay (HOSPITAL_COMMUNITY)
Admit: 2018-11-21 | Discharge: 2018-11-21 | Disposition: A | Discharge status: Home / Self Care | Payer: BLUE CROSS/BLUE SHIELD | Attending: Internal Medicine | Admitting: Internal Medicine

## 2018-11-21 DIAGNOSIS — R7303 Prediabetes: Secondary | ICD-10-CM | POA: Diagnosis not present

## 2018-11-21 DIAGNOSIS — G40211 Localization-related (focal) (partial) symptomatic epilepsy and epileptic syndromes with complex partial seizures, intractable, with status epilepticus: Secondary | ICD-10-CM | POA: Diagnosis not present

## 2018-11-21 DIAGNOSIS — G459 Transient cerebral ischemic attack, unspecified: Secondary | ICD-10-CM | POA: Diagnosis not present

## 2018-11-21 DIAGNOSIS — R202 Paresthesia of skin: Secondary | ICD-10-CM | POA: Diagnosis present

## 2018-11-21 DIAGNOSIS — R42 Dizziness and giddiness: Secondary | ICD-10-CM | POA: Diagnosis not present

## 2018-11-21 LAB — HIV ANTIBODY (ROUTINE TESTING W REFLEX): HIV Screen 4th Generation wRfx: NONREACTIVE

## 2018-11-21 MED ORDER — ASPIRIN 325 MG PO TABS: 325.0000 mg | ORAL_TABLET | Freq: Every day | ORAL | 0 refills | Status: DC

## 2018-11-21 NOTE — Discharge Summary (Signed)
Physician Discharge Summary  Austin Griffith ZOX:[id number] DOB: September 17, 1961 DOA: 11/19/2018  PCP: Sharilyn Sites, MD  Admit date: 11/19/2018 Discharge date: 11/21/2018  Admitted From: home Disposition:  home  Recommendations for Outpatient Follow-up:  Follow up with PCP in 1 week.  patient needs to be scheduled for an open MRI of the brain in 2 weeks. He then needs to follow up with Neurologist Dr Merlene Laughter in 4 weeks.  Home Health:none Equipment/Devices:none  Discharge Condition:home CODE STATUS:full code Diet recommendation: regular    Discharge Diagnoses:  Principle problem:   TIA (transient ischemic attack)   Paresthesia of left arm and leg    Active Problems:   Anxiety   Depression   History of ETOH abuse   Brief narrative/ HPI 58 y.o. male, with history of anxiety, depression, multiple myeloma in remission came to hospital with complaints of left-sided numbness going on for past 1 week.  Patient says that he gets episodes where he will have numbness around the lips and tongue which would spread to the face and the left arm and leg usually these episodes will last for 2 minutes.  Resolves by laying down in the bed and gets worse on moving around.  Patient says that today the episode was not going away so he came to the hospital for further evaluation. At this time his numbness has resolved.  He denies weakness of extremities, no slurred speech, no blurred vision.  No previous history of stroke or TIA.  He does have history of anxiety/panic attacks and takes Ativan twice a day as needed.  He did take Ativan today before coming to the ED and is feeling much better. CT head was negative in the ED, patient being placed under observation for TIA work-up.  Patient cannot have MRI due to claustrophobia.  Tele neurology was consulted by ED physician, and neurologist recommended that patient have other work-up for TIA including carotid Dopplers, echo and repeat CT head.  Hospital  course  face and left sided paresthesia ?TIA Etiology unclear. CT head, carotid US and echo unrevealing.patient refused MRI due to severe claustrophobia.  ldl OF 118, A1C of 5.7 No further symptoms. CT head does show some atrophic changes which are disproportionate to his age. EEG without any epileptiform activity.  seen by neurology ( full consult note pending). recommends discharge on full dose aspirin and outpatient open MRI brain in 2 weeks and then follow with him in 4 weeks. Patient instructed to follow up with his PCP this week to arrange outpt MRI.  Anxiety/ depression  resume home meds  GERD  PPI   Prediabetes and obesity  A1C of 5.7. BMI of 32.34kg/ m2. Needs to be addressed as outpatient.  Disposition: home  Consult: neurology    Discharge Instructions   Allergies as of 11/21/2018   No Known Allergies     Medication List    TAKE these medications   aspirin 325 MG tablet Take 1 tablet (325 mg total) by mouth daily. Start taking on:  November 22, 2018   escitalopram 20 MG tablet Commonly known as:  LEXAPRO Take 1 tablet (20 mg total) by mouth daily.   ibuprofen 200 MG tablet Commonly known as:  ADVIL,MOTRIN Take 200 mg by mouth every 6 (six) hours as needed.   lansoprazole 15 MG capsule Commonly known as:  PREVACID Take 15 mg by mouth daily.   loratadine 10 MG tablet Commonly known as:  CLARITIN Take 10 mg by mouth daily.   LORazepam 1  MG tablet Commonly known as:  ATIVAN Take 1 tablet (1 mg total) by mouth 2 (two) times daily as needed for anxiety.      Follow-up Information    Sharilyn Sites, MD. Schedule an appointment as soon as possible for a visit in 1 week(s).   Specialty:  Family Medicine Contact information: [address] [place] [address] [phone]        Phillips Odor, MD Follow up in 4 week(s).   Specialty:  Neurology Contact information: [address] [place] [address] [phone]           No Known Allergies    Procedures/Studies: Dg Chest 2 View  Result Date: 11/20/2018 CLINICAL DATA:  TIA EXAM: CHEST - 2 VIEW COMPARISON:  01/26/2017 FINDINGS: Heart and mediastinal contours are within normal limits. No focal opacities or effusions. No acute bony abnormality. Old healed left 7th rib fracture. IMPRESSION: No active cardiopulmonary disease. Electronically Signed   By: Rolm Baptise M.D.   On: 11/20/2018 02:07   Ct Head Wo Contrast  Result Date: 11/20/2018 CLINICAL DATA:  Follow-up TIA.  Left-sided numbness EXAM: CT HEAD WITHOUT CONTRAST TECHNIQUE: Contiguous axial images were obtained from the base of the skull through the vertex without intravenous contrast. COMPARISON:  Yesterday FINDINGS: Brain: No evidence of acute infarction, hemorrhage, hydrocephalus, extra-axial collection or mass lesion/mass effect. Vascular: No hyperdense vessel or unexpected calcification. Skull: Normal. Negative for fracture or focal lesion. Sinuses/Orbits: Negative IMPRESSION: Stable, negative head CT. Electronically Signed   By: Monte Fantasia M.D.   On: 11/20/2018 15:20   Ct Head Wo Contrast  Result Date: 11/19/2018 CLINICAL DATA:  Focal neural deficit of more than 6 hours, suspected stroke, presents with tingling and numbness down LEFT side along with blurred vision since 1330 hours today, intermittent dizziness and LEFT-sided weakness when standing, history multiple myeloma, hypertension at EXAM: CT HEAD WITHOUT CONTRAST TECHNIQUE: Contiguous axial images were obtained from the base of the skull through the vertex without intravenous contrast. Sagittal and coronal MPR images reconstructed from axial data set. COMPARISON:  01/19/2012 FINDINGS: Brain: Generalized atrophy. Normal ventricular morphology. No midline shift or mass effect. Normal appearance of brain parenchyma. No intracranial hemorrhage, mass lesion, or evidence of acute infarction. No extra-axial fluid collections. Vascular: No hyperdense  vessels Skull: Intact without focal lesion Sinuses/Orbits: Mucosal thickening in sphenoid sinus. Other: N/A IMPRESSION: Generalized atrophy. No acute intracranial abnormalities. Electronically Signed   By: Lavonia Dana M.D.   On: 11/19/2018 22:39   US Carotid Bilateral (at [facility] Only)  Result Date: 11/20/2018 CLINICAL DATA:  58 year old male with a history of left-sided numbness EXAM: BILATERAL CAROTID DUPLEX ULTRASOUND TECHNIQUE: Pearline Cables scale imaging, color Doppler and duplex ultrasound were performed of bilateral carotid and vertebral arteries in the neck. COMPARISON:  No prior duplex FINDINGS: Criteria: Quantification of carotid stenosis is based on velocity parameters that correlate the residual internal carotid diameter with NASCET-based stenosis levels, using the diameter of the distal internal carotid lumen as the denominator for stenosis measurement. The following velocity measurements were obtained: RIGHT ICA:  Systolic 77 cm/sec, Diastolic 38 cm/sec CCA:  73 cm/sec SYSTOLIC ICA/CCA RATIO:  0.9 ECA:  75 cm/sec LEFT ICA:  Systolic 74 cm/sec, Diastolic 30 cm/sec CCA:  80 cm/sec SYSTOLIC ICA/CCA RATIO:  0.7 ECA:  83 cm/sec Right Brachial SBP: Not acquired Left Brachial SBP: Not acquired RIGHT CAROTID ARTERY: No significant calcifications of the right common carotid artery. Intermediate waveform maintained. Heterogeneous and partially calcified plaque at  the right carotid bifurcation. No significant lumen shadowing. Low resistance waveform of the right ICA. No significant tortuosity. RIGHT VERTEBRAL ARTERY: Antegrade flow with low resistance waveform. LEFT CAROTID ARTERY: No significant calcifications of the left common carotid artery. Intermediate waveform maintained. Heterogeneous and partially calcified plaque at the left carotid bifurcation without significant lumen shadowing. Low resistance waveform of the left ICA. No significant tortuosity. LEFT VERTEBRAL ARTERY:  Antegrade flow with low resistance  waveform. IMPRESSION: Color duplex indicates minimal heterogeneous and calcified plaque, with no hemodynamically significant stenosis by duplex criteria in the extracranial cerebrovascular circulation. Signed, Dulcy Fanny. Dellia Nims, RPVI [facility] Electronically Signed   By: Corrie Mckusick D.O.   On: 11/20/2018 10:38    Echo 1. The left ventricle has normal systolic function of 28-00%. The cavity size was mildly increased. There is mildly increased left ventricular wall thickness. Echo evidence of normal diastolic relaxation.  2. The right ventricle has normal systolic function. The cavity was normal. There is no increase in right ventricular wall thickness.  3. The mitral valve is normal in structure.  4. The tricuspid valve is normal in structure.  5. The aortic valve is tricuspid There is mild sclerosis of the aortic valve.  6. The pulmonic valve was normal in structure.  Subjective: No further symptoms  Discharge Exam: Vitals:   11/21/18 0902 11/21/18 1302  BP: 138/87 (!) 150/75  Pulse: 67 72  Resp: 18 18  Temp: 97.8 F (36.6 C) 97.6 F (36.4 C)  SpO2: 98% 100%   Vitals:   11/21/18 0102 11/21/18 0502 11/21/18 0902 11/21/18 1302  BP: 140/86 139/84 138/87 (!) 150/75  Pulse: 64 (!) 58 67 72  Resp: 18 17 18 18   Temp: 98.1 F (36.7 C) (!) 97.5 F (36.4 C) 97.8 F (36.6 C) 97.6 F (36.4 C)  TempSrc: Oral Oral Oral Oral  SpO2: 99% 97% 98% 100%  Weight:      Height:        General:NAD HEENT: moist mucosa, supple neck Chest: clear b/l  CVS: NS1&S2 GI: soft,NT, ND Musculoskeletal: warm, no edema CNS: AAOX3, non focal     The results of significant diagnostics from this hospitalization (including imaging, microbiology, ancillary and laboratory) are listed below for reference.     Microbiology: No results found for this or any previous visit (from the past 240 hour(s)).   Labs: BNP (last 3 results) No  results for input(s): BNP in the last 8760 hours. Basic Metabolic Panel: Recent Labs  Lab 11/19/18 2135  NA 138  K 3.7  CL 105  CO2 25  GLUCOSE 94  BUN 16  CREATININE 1.12  CALCIUM 9.0  MG 2.1   Liver Function Tests: Recent Labs  Lab 11/19/18 2135  AST 33  ALT 41  ALKPHOS 48  BILITOT 0.8  PROT 6.9  ALBUMIN 4.0   No results for input(s): LIPASE, AMYLASE in the last 168 hours. No results for input(s): AMMONIA in the last 168 hours. CBC: Recent Labs  Lab 11/19/18 2135  WBC 4.7  NEUTROABS 2.8  HGB 13.2  HCT 38.9*  MCV 94.6  PLT 142*   Cardiac Enzymes: No results for input(s): CKTOTAL, CKMB, CKMBINDEX, TROPONINI in the last 168 hours. BNP: Invalid input(s): POCBNP CBG: No results for input(s): GLUCAP in the last 168 hours. D-Dimer No results for input(s): DDIMER in the last 72 hours. Hgb A1c Recent Labs    11/20/18 0539  HGBA1C 5.7*   Lipid Profile  Recent Labs    11/20/18 0539  CHOL 177  HDL 30*  LDLCALC 118*  TRIG 146  CHOLHDL 5.9   Thyroid function studies No results for input(s): TSH, T4TOTAL, T3FREE, THYROIDAB in the last 72 hours.  Invalid input(s): FREET3 Anemia work up No results for input(s): VITAMINB12, FOLATE, FERRITIN, TIBC, IRON, RETICCTPCT in the last 72 hours. Urinalysis    Component Value Date/Time   COLORURINE YELLOW 11/20/2018 0045   APPEARANCEUR CLEAR 11/20/2018 0045   LABSPEC 1.025 11/20/2018 0045   LABSPEC 1.030 10/30/2016 1446   PHURINE 5.5 11/20/2018 0045   GLUCOSEU NEGATIVE 11/20/2018 0045   GLUCOSEU Negative 10/30/2016 1446   HGBUR TRACE (A) 11/20/2018 0045   BILIRUBINUR NEGATIVE 11/20/2018 0045   BILIRUBINUR Negative 10/30/2016 1446   KETONESUR NEGATIVE 11/20/2018 0045   PROTEINUR NEGATIVE 11/20/2018 0045   UROBILINOGEN 0.2 10/30/2016 1446   NITRITE NEGATIVE 11/20/2018 0045   LEUKOCYTESUR NEGATIVE 11/20/2018 0045   LEUKOCYTESUR Negative 10/30/2016 1446   Sepsis Labs Invalid input(s): PROCALCITONIN,  WBC,   LACTICIDVEN Microbiology No results found for this or any previous visit (from the past 240 hour(s)).   Time coordinating discharge: < 30 minutes  SIGNED:   Louellen Molder, MD  [facility] 11/21/2018, 6:30 PM Pager   If 7PM-7AM, please contact night-coverage [email or web address] Password [email or web address]

## 2018-11-21 NOTE — Procedures (Signed)
History: 58 year old male being evaluated for transient neurological symptoms  Sedation: None  Technique: This is a 21 channel routine scalp EEG performed at the bedside with bipolar and monopolar montages arranged in accordance to the international 10/20 system of electrode placement. One channel was dedicated to EKG recording.    Background: The background consists of intermixed alpha and beta activities. There is a well defined posterior dominant rhythm of 10 hz that attenuates with eye opening.  With drowsiness there is anterior shifting of the posterior dominant rhythm, but sleep is not recorded.  Photic stimulation: Physiologic driving is not performed  EEG Abnormalities: None  Clinical Interpretation: This normal EEG is recorded in the waking and drowsy state. There was no seizure or seizure predisposition recorded on this study. Please note that lack of epileptiform activity on EEG does not preclude the possibility of epilepsy.   Roland Rack, MD [facility] [phone]  If 7pm- 7am, please page neurology on call as listed in [place].

## 2018-11-21 NOTE — Progress Notes (Signed)
EEG completed, results pending. 

## 2018-11-21 NOTE — Progress Notes (Signed)
IV removed, WNL. D/C instructions given to pt. Verbalized understanding.

## 2018-11-21 NOTE — Consult Note (Signed)
Austin A. Merlene Laughter, MD     [email or web address]          Austin Griffith is an 58 y.o. male.   ASSESSMENT/PLAN: 1.  Recurrent episodes of dizziness and left-sided numbness and tingling of unclear etiology: The differential diagnosis includes recurrent ischemic events/TIA, partial seizures, migraine equivalent episodes and psychosomatic disorders.  I recommend the patient be continued on aspirin 325 mg.  An open MRI is suggested.  This needs to be done within 2 weeks for the study to be most beneficial in helping to diagnose acute/subacute stroke.  The patient can continue using Ativan on a as needed basis as this appears to be beneficial for his symptoms.  2.  Likely obstructive sleep apnea syndrome: Outpatient testing is recommended.  3.  History of anxiety disorders in the past    The patient is a 58 year old white male who presents with about a week history of episodic and increasing frequency of periorbital tingling and tingling involving the left side.  The events seem to last a few minutes when they come on.  The episodes have been associated with significant dizziness and lightheadedness especially on standing.  The patient decided to seek medical attention because they were recurring much more frequently.  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  He actually tells me that he has had spells somewhat similar to these in the past.  The patient's primary care provider thought he was having anxiety attacks and gave him Ativan which helped those attacks back then.  The patient does not report having focal weakness with these episodes.  He denies headaches, dysphasia or dysarthria.  He does not report any dyspnea or palpitations.  He tells me he is easy bruisability since his bone cancer several years ago.  He is noted to have discoloration involving the right lower extremity calf region and tells me this happened after he was  treated for multiple myeloma.  The review of systems otherwise negative.  The patient was to have an MRI done during this admission but this cannot be done despite a few attempts due to the marked claustrophobia.    GENERAL: This a pleasant overweight male in no acute distress.  HEENT: Large tongue and large neck with crowded airway  ABDOMEN: soft  EXTREMITIES: No edema; couple spots of hyperpigmentation involving the medial calf region on the right.  Bruising on the left leg region  BACK: Normal  SKIN: Normal by inspection.    MENTAL STATUS: Alert and oriented-including name and month. Speech, language and cognition are generally intact. Judgment and insight normal.   CRANIAL NERVES: Pupils are equal, round and reactive to light and accomodation; extra ocular movements are full, there is no significant nystagmus; visual fields are full; upper and lower facial muscles are normal in strength and symmetric, there is no flattening of the nasolabial folds; tongue is midline; uvula is midline; shoulder elevation is normal.  MOTOR: Normal tone, bulk and strength; no pronator drift.  COORDINATION: Left finger to nose is normal, right finger to nose is normal, No rest tremor; no intention tremor; no postural tremor; no bradykinesia.  REFLEXES: Deep tendon reflexes are symmetrical and normal. Plantar reflexes are flexor bilaterally.   SENSATION: Normal to light touch, temperature, and pain.  NIH stroke scale 0    Blood pressure (!) 150/75, pulse 72, temperature 97.6 F (36.4 C), temperature source Oral, resp. rate 18, height _0  (1.956 m), weight 123.7 kg,  SpO2 100 %.  Past Medical History:  Diagnosis Date  . Acid reflux   . Anxiety 07/05/2013  . AVN (avascular necrosis of bone) (HCC)    bilateral hips; s/p hip replacement in 1991 and 2009  . Blurred vision    Present for "many years"  . Bone metastases ([place]) 03/31/13    MR Abdomen -Lower Thoracic and Upper Lumbar spine  .  Depression 11/22/2013  . Epistaxis 02/08/2012  . Extramedullary plasmacytoma in relapse ([place]) 04/10/2013  . Fatigue   . Fatigue 05/25/12   "Mild"  . Headache(784.0) 02/08/2012   Right Sided Headache Behind Right Eye  . History of radiation therapy 02/29/12-04/08/12   right  ethmoid sinus in 50.4Gy in 20 fxs  . Hyperlipidemia    diet control   . Hypertension   . Knee pain 10/18/2013  . Liver lesion 03/31/13   MR Abdomen  . Medically noncompliant 09/29/2015  . Multiple myeloma in remission ([place]) 11/22/2013  . Multiple myeloma, in relapse 07/05/2013  . Nausea alone 09/12/2013  . Neutropenic fever ([place]) 09/18/2013  . On antineoplastic chemotherapy started 04/08/13   Revlimid/ Velcade/ Dex  . Pancreatic lesion 03/31/13    MR Abdomen - Ucinate Process  . Plasmacytoma ([place])     Right Ethmoid Sinus; bone marrow biopsy on 03/10/13 showed normal myeloma FISH panel and cytogenetics.   . S/P radiation therapy 03/28/2013   Left posterior 7th Rib / 8Gy in 1 fraction  . Shingles Feb 24, 2015  . Thrombocytopenia ([place]) 05/25/12    Past Surgical History:  Procedure Laterality Date  . Bilateral hip replacement  1991; and 2009   due to bilateral hip AVN's. / reports left hip was not replaced  . EUS N/A 03/24/2013   Procedure: UPPER ENDOSCOPIC ULTRASOUND (EUS) LINEAR;  Surgeon: Beryle Beams, MD;  Location: [facility];  Service: Endoscopy;  Laterality: N/A;  . FINE NEEDLE ASPIRATION N/A 03/24/2013   Procedure: FINE NEEDLE ASPIRATION (FNA) LINEAR;  Surgeon: Beryle Beams, MD;  Location: [facility];  Service: Endoscopy;  Laterality: N/A;  . MASS BIOPSY  01/26/12   Right Ethmoid Mass - Plasma Cell Neoplasm    Family History  Adopted: Yes    Social History:  reports that he has never smoked. He has never used smokeless tobacco. He reports current alcohol use. He reports that he does not use drugs.  Allergies: No Known Allergies  Medications: Prior to Admission medications   Medication Sig Start Date  End Date Taking? Authorizing Provider  escitalopram (LEXAPRO) 20 MG tablet Take 1 tablet (20 mg total) by mouth daily. 11/22/13  Yes Gorsuch, Ni, MD  ibuprofen (ADVIL,MOTRIN) 200 MG tablet Take 200 mg by mouth every 6 (six) hours as needed.   Yes [provider]  lansoprazole (PREVACID) 15 MG capsule Take 15 mg by mouth daily.   Yes [provider]  loratadine (CLARITIN) 10 MG tablet Take 10 mg by mouth daily.   Yes [provider]  LORazepam (ATIVAN) 1 MG tablet Take 1 tablet (1 mg total) by mouth 2 (two) times daily as needed for anxiety. 09/17/16  Yes IdolAlmyra Free, PA-C    Scheduled Meds: . aspirin  300 mg Rectal Daily   Or  . aspirin  325 mg Oral Daily  . enoxaparin (LOVENOX) injection  40 mg Subcutaneous Q24H  . escitalopram  20 mg Oral Daily  . loratadine  10 mg Oral Daily  . pantoprazole  40 mg Oral Daily   Continuous Infusions: .  sodium chloride     PRN Meds:.acetaminophen **OR** acetaminophen (TYLENOL) oral liquid 160 mg/5 mL **OR** acetaminophen, LORazepam     Results for orders placed or performed during the hospital encounter of 11/19/18 (from the past 48 hour(s))  Ethanol     Status: None   Collection Time: 11/19/18  9:33 PM  Result Value Ref Range   Alcohol, Ethyl (B) <10 <10 mg/dL    Comment: Performed at [facility], [address] [place], [place] [address]  Protime-INR     Status: None   Collection Time: 11/19/18  9:35 PM  Result Value Ref Range   Prothrombin Time 12.7 11.4 - 15.2 seconds   INR 0.96     Comment: Performed at [facility], [address] [place], [place] [address]  APTT     Status: None   Collection Time: 11/19/18  9:35 PM  Result Value Ref Range   aPTT 29 24 - 36 seconds    Comment: Performed at [facility], [address] [place], [place] [address]  CBC     Status: Abnormal   Collection Time: 11/19/18  9:35 PM  Result Value Ref Range   WBC 4.7 4.0 - 10.5 K/uL   RBC 4.11 (L) 4.22 - 5.81 MIL/uL   Hemoglobin  13.2 13.0 - 17.0 g/dL   HCT 38.9 (L) 39.0 - 52.0 %   MCV 94.6 80.0 - 100.0 fL   MCH 32.1 26.0 - 34.0 pg   MCHC 33.9 30.0 - 36.0 g/dL   RDW 12.6 11.5 - 15.5 %   Platelets 142 (L) 150 - 400 K/uL   nRBC 0.0 0.0 - 0.2 %    Comment: Performed at [facility], [address], [place], [place] [address]  Differential     Status: None   Collection Time: 11/19/18  9:35 PM  Result Value Ref Range   Neutrophils Relative % 60 %   Neutro Abs 2.8 1.7 - 7.7 K/uL   Lymphocytes Relative 24 %   Lymphs Abs 1.1 0.7 - 4.0 K/uL   Monocytes Relative 12 %   Monocytes Absolute 0.6 0.1 - 1.0 K/uL   Eosinophils Relative 4 %   Eosinophils Absolute 0.2 0.0 - 0.5 K/uL   Basophils Relative 0 %   Basophils Absolute 0.0 0.0 - 0.1 K/uL   Immature Granulocytes 0 %   Abs Immature Granulocytes 0.01 0.00 - 0.07 K/uL    Comment: Performed at [facility], [address], [place], [place] [address]  Comprehensive metabolic panel     Status: None   Collection Time: 11/19/18  9:35 PM  Result Value Ref Range   Sodium 138 135 - 145 mmol/L   Potassium 3.7 3.5 - 5.1 mmol/L   Chloride 105 98 - 111 mmol/L   CO2 25 22 - 32 mmol/L   Glucose, Bld 94 70 - 99 mg/dL   BUN 16 6 - 20 mg/dL   Creatinine, Ser 1.12 0.61 - 1.24 mg/dL   Calcium 9.0 8.9 - 10.3 mg/dL   Total Protein 6.9 6.5 - 8.1 g/dL   Albumin 4.0 3.5 - 5.0 g/dL   AST 33 15 - 41 U/L   ALT 41 0 - 44 U/L   Alkaline Phosphatase 48 38 - 126 U/L   Total Bilirubin 0.8 0.3 - 1.2 mg/dL   GFR calc non Af Amer >60 >60 mL/min   GFR calc Af Amer >60 >60 mL/min   Anion gap 8 5 - 15    Comment: Performed at [facility], [address] [place], [place] [address]  Magnesium     Status: None   Collection Time: 11/19/18  9:35 PM  Result Value Ref Range   Magnesium 2.1 1.7 - 2.4 mg/dL    Comment: Performed at [facility], [address] [place], [place] [address]  Urine rapid drug screen (hosp performed)     Status: Abnormal   Collection Time: 11/20/18 12:45 AM  Result Value  Ref Range   Opiates NONE DETECTED NONE DETECTED   Cocaine NONE DETECTED NONE DETECTED   Benzodiazepines POSITIVE (A) NONE DETECTED   Amphetamines NONE DETECTED NONE DETECTED   Tetrahydrocannabinol NONE DETECTED NONE DETECTED   Barbiturates NONE DETECTED NONE DETECTED    Comment: (NOTE) DRUG SCREEN FOR MEDICAL PURPOSES ONLY.  IF CONFIRMATION IS NEEDED FOR ANY PURPOSE, NOTIFY LAB WITHIN 5 DAYS. LOWEST DETECTABLE LIMITS FOR URINE DRUG SCREEN Drug Class                     Cutoff (ng/mL) Amphetamine and metabolites    1000 Barbiturate and metabolites    200 Benzodiazepine                 235 Tricyclics and metabolites     300 Opiates and metabolites        300 Cocaine and metabolites        300 THC                            50 Performed at [place] [place], [place] [address]   Urinalysis, Routine w reflex microscopic     Status: Abnormal   Collection Time: 11/20/18 12:45 AM  Result Value Ref Range   Color, Urine YELLOW YELLOW   APPearance CLEAR CLEAR   Specific Gravity, Urine 1.025 1.005 - 1.030   pH 5.5 5.0 - 8.0   Glucose, UA NEGATIVE NEGATIVE mg/dL   Hgb urine dipstick TRACE (A) NEGATIVE   Bilirubin Urine NEGATIVE NEGATIVE   Ketones, ur NEGATIVE NEGATIVE mg/dL   Protein, ur NEGATIVE NEGATIVE mg/dL   Nitrite NEGATIVE NEGATIVE   Leukocytes, UA NEGATIVE NEGATIVE    Comment: Performed at [facility], [address] [place], [place] [address]  Urinalysis, Microscopic (reflex)     Status: Abnormal   Collection Time: 11/20/18 12:45 AM  Result Value Ref Range   RBC / HPF 0-5 0 - 5 RBC/hpf   WBC, UA 0-5 0 - 5 WBC/hpf   Bacteria, UA RARE (A) NONE SEEN   Squamous Epithelial / LPF 0-5 0 - 5    Comment: Performed at [facility], [address], [place], [place] [address]  HIV antibody (Routine Testing)     Status: None   Collection Time: 11/20/18  5:39 AM  Result Value Ref Range   HIV Screen 4th Generation wRfx Non Reactive Non Reactive    Comment:  (NOTE) Performed At: [facility] [place], [place] [id number] [place] MD [id number]   Hemoglobin A1c     Status: Abnormal   Collection Time: 11/20/18  5:39 AM  Result Value Ref Range   Hgb A1c MFr Bld 5.7 (H) 4.8 - 5.6 %    Comment: (NOTE) Pre diabetes:          5.7%-6.4% Diabetes:              >6.4% Glycemic control for   <7.0% adults with diabetes    Mean  Plasma Glucose 116.89 mg/dL    Comment: Performed at [facility] [address] [place], [place] [address]  Lipid panel     Status: Abnormal   Collection Time: 11/20/18  5:39 AM  Result Value Ref Range   Cholesterol 177 0 - 200 mg/dL   Triglycerides 146 <150 mg/dL   HDL 30 (L) >40 mg/dL   Total CHOL/HDL Ratio 5.9 RATIO   VLDL 29 0 - 40 mg/dL   LDL Cholesterol 118 (H) 0 - 99 mg/dL    Comment:        Total Cholesterol/HDL:CHD Risk Coronary Heart Disease Risk Table                     Men   Women  1/2 Average Risk   3.4   3.3  Average Risk       5.0   4.4  2 X Average Risk   9.6   7.1  3 X Average Risk  23.4   11.0        Use the calculated Patient Ratio above and the CHD Risk Table to determine the patient's CHD Risk.        ATP III CLASSIFICATION (LDL):  <100     mg/dL   Optimal  100-129  mg/dL   Near or Above                    Optimal  130-159  mg/dL   Borderline  160-189  mg/dL   High  >190     mg/dL   Very High Performed at [facility] [place],  [address]     Studies/Results:   TTE   1. The left ventricle has normal systolic function of 47-65%. The cavity size was mildly increased. There is mildly increased left ventricular wall thickness. Echo evidence of normal diastolic relaxation.  2. The right ventricle has normal systolic function. The cavity was normal. There is no increase in right ventricular wall thickness.  3. The mitral valve is normal in structure.  4. The tricuspid valve is normal in structure.  5. The aortic valve is  tricuspid There is mild sclerosis of the aortic valve.  6. The pulmonic valve was normal in structure.      REPEAT HEAD CT FINDINGS: Brain: No evidence of acute infarction, hemorrhage, hydrocephalus, extra-axial collection or mass lesion/mass effect.  Vascular: No hyperdense vessel or unexpected calcification.  Skull: Normal. Negative for fracture or focal lesion.  Sinuses/Orbits: Negative  IMPRESSION: Stable, negative head CT.   HEAD CT FINDINGS: Brain: Generalized atrophy. Normal ventricular morphology. No midline shift or mass effect. Normal appearance of brain parenchyma. No intracranial hemorrhage, mass lesion, or evidence of acute infarction. No extra-axial fluid collections.  Vascular: No hyperdense vessels  Skull: Intact without focal lesion  Sinuses/Orbits: Mucosal thickening in sphenoid sinus.  Other: N/A  IMPRESSION: Generalized atrophy.  No acute intracranial abnormalities       The head CT is reviewed in person.  There is evidence of rather marked global atrophy given the patient's age.  The atrophy particularly is located in the frontal temporal regions bilaterally.  Otherwise no acute findings are noted.    Brieana Shimmin A. Merlene Griffith, M.D.  Diplomate, Tax adviser of Psychiatry and Neurology ( Neurology). 11/21/2018, 5:07 PM

## 2018-11-21 NOTE — Progress Notes (Signed)
OT Cancellation Note  Patient Details Name: Austin Griffith MRN: [id number] DOB: 05-Nov-1960   Cancelled Treatment:    Reason Eval/Treat Not Completed: OT screened, no needs identified, will sign off   Ailene Ravel, OTR/L,CBIS  [phone]  11/21/2018, 8:12 AM

## 2018-11-21 NOTE — Progress Notes (Signed)
SLP Cancellation Note  Patient Details Name: Austin Griffith MRN: [id number] DOB: [birth year]   Cancelled treatment:       Reason Eval/Treat Not Completed: SLP screened, no needs identified, will sign off. No cognitive-linguistic changes, however, Pt continues to report mild oral/labial numbness and tingling however this is not negatively impacting his speech at this time. Thank you for this referral,  Dua Mehler H. Roddie Mc, CCC-SLP Speech Language Pathologist    Wende Bushy 11/21/2018, 8:06 AM

## 2018-11-24 DIAGNOSIS — G459 Transient cerebral ischemic attack, unspecified: Secondary | ICD-10-CM | POA: Diagnosis not present

## 2018-11-24 DIAGNOSIS — Z1389 Encounter for screening for other disorder: Secondary | ICD-10-CM | POA: Diagnosis not present

## 2018-11-24 DIAGNOSIS — E6609 Other obesity due to excess calories: Secondary | ICD-10-CM | POA: Diagnosis not present

## 2018-11-24 DIAGNOSIS — Z6832 Body mass index (BMI) 32.0-32.9, adult: Secondary | ICD-10-CM | POA: Diagnosis not present

## 2019-03-17 DIAGNOSIS — F419 Anxiety disorder, unspecified: Secondary | ICD-10-CM | POA: Diagnosis not present

## 2019-03-17 DIAGNOSIS — C9001 Multiple myeloma in remission: Secondary | ICD-10-CM | POA: Diagnosis not present

## 2019-03-17 DIAGNOSIS — N529 Male erectile dysfunction, unspecified: Secondary | ICD-10-CM | POA: Diagnosis not present

## 2019-03-17 DIAGNOSIS — I10 Essential (primary) hypertension: Secondary | ICD-10-CM | POA: Diagnosis not present

## 2019-03-17 DIAGNOSIS — C9020 Extramedullary plasmacytoma not having achieved remission: Secondary | ICD-10-CM | POA: Diagnosis not present

## 2019-03-17 DIAGNOSIS — E6609 Other obesity due to excess calories: Secondary | ICD-10-CM | POA: Diagnosis not present

## 2019-03-17 DIAGNOSIS — Z6831 Body mass index (BMI) 31.0-31.9, adult: Secondary | ICD-10-CM | POA: Diagnosis not present

## 2019-03-17 DIAGNOSIS — Z1389 Encounter for screening for other disorder: Secondary | ICD-10-CM | POA: Diagnosis not present

## 2019-05-18 DIAGNOSIS — F419 Anxiety disorder, unspecified: Secondary | ICD-10-CM | POA: Diagnosis not present

## 2019-05-18 DIAGNOSIS — E291 Testicular hypofunction: Secondary | ICD-10-CM | POA: Diagnosis not present

## 2019-05-18 DIAGNOSIS — Z6831 Body mass index (BMI) 31.0-31.9, adult: Secondary | ICD-10-CM | POA: Diagnosis not present

## 2019-06-05 ENCOUNTER — Other Ambulatory Visit: Payer: BLUE CROSS/BLUE SHIELD

## 2019-06-07 ENCOUNTER — Telehealth: Payer: Self-pay

## 2019-06-07 NOTE — Telephone Encounter (Signed)
-----   Message from Heath Lark, MD sent at 06/07/2019 12:39 PM EDT ----- Regarding: no show for labs last week Pls call him and cancel his appt next week He needs to get labs done 1 week before appt

## 2019-06-07 NOTE — Telephone Encounter (Signed)
LVM with pt letting him know we would be cancelling upcoming appt d/t he did not come in for his lab appt on 8/24.   Inbasket msg sent to schedulers to get him rescheduled for labs and see Dr Alvy Bimler one week later.

## 2019-06-08 ENCOUNTER — Telehealth: Payer: Self-pay | Admitting: Hematology and Oncology

## 2019-06-08 NOTE — Telephone Encounter (Signed)
Scheduled appt per 8/26 sch message- pt is aware of appt date and time  

## 2019-06-12 ENCOUNTER — Ambulatory Visit: Payer: BLUE CROSS/BLUE SHIELD | Admitting: Hematology and Oncology

## 2019-06-15 ENCOUNTER — Inpatient Hospital Stay: Payer: BC Managed Care – PPO | Attending: Hematology and Oncology

## 2019-06-15 ENCOUNTER — Other Ambulatory Visit: Payer: Self-pay

## 2019-06-15 DIAGNOSIS — Z79899 Other long term (current) drug therapy: Secondary | ICD-10-CM | POA: Insufficient documentation

## 2019-06-15 DIAGNOSIS — Z7982 Long term (current) use of aspirin: Secondary | ICD-10-CM | POA: Diagnosis not present

## 2019-06-15 DIAGNOSIS — D61818 Other pancytopenia: Secondary | ICD-10-CM | POA: Insufficient documentation

## 2019-06-15 DIAGNOSIS — Z791 Long term (current) use of non-steroidal anti-inflammatories (NSAID): Secondary | ICD-10-CM | POA: Insufficient documentation

## 2019-06-15 DIAGNOSIS — C9001 Multiple myeloma in remission: Secondary | ICD-10-CM | POA: Insufficient documentation

## 2019-06-15 DIAGNOSIS — Z9221 Personal history of antineoplastic chemotherapy: Secondary | ICD-10-CM | POA: Insufficient documentation

## 2019-06-15 DIAGNOSIS — Z85828 Personal history of other malignant neoplasm of skin: Secondary | ICD-10-CM | POA: Insufficient documentation

## 2019-06-15 DIAGNOSIS — E538 Deficiency of other specified B group vitamins: Secondary | ICD-10-CM | POA: Diagnosis not present

## 2019-06-15 DIAGNOSIS — E559 Vitamin D deficiency, unspecified: Secondary | ICD-10-CM | POA: Diagnosis not present

## 2019-06-15 LAB — CBC WITH DIFFERENTIAL/PLATELET
Abs Immature Granulocytes: 0.01 10*3/uL (ref 0.00–0.07)
Basophils Absolute: 0 10*3/uL (ref 0.0–0.1)
Basophils Relative: 1 %
Eosinophils Absolute: 0.1 10*3/uL (ref 0.0–0.5)
Eosinophils Relative: 2 %
HCT: 40.7 % (ref 39.0–52.0)
Hemoglobin: 14.1 g/dL (ref 13.0–17.0)
Immature Granulocytes: 0 %
Lymphocytes Relative: 23 %
Lymphs Abs: 0.9 10*3/uL (ref 0.7–4.0)
MCH: 33.9 pg (ref 26.0–34.0)
MCHC: 34.6 g/dL (ref 30.0–36.0)
MCV: 97.8 fL (ref 80.0–100.0)
Monocytes Absolute: 0.4 10*3/uL (ref 0.1–1.0)
Monocytes Relative: 11 %
Neutro Abs: 2.4 10*3/uL (ref 1.7–7.7)
Neutrophils Relative %: 63 %
Platelets: 120 10*3/uL — ABNORMAL LOW (ref 150–400)
RBC: 4.16 MIL/uL — ABNORMAL LOW (ref 4.22–5.81)
RDW: 13.2 % (ref 11.5–15.5)
WBC: 3.8 10*3/uL — ABNORMAL LOW (ref 4.0–10.5)
nRBC: 0 % (ref 0.0–0.2)

## 2019-06-15 LAB — VITAMIN B12: Vitamin B-12: 198 pg/mL (ref 180–914)

## 2019-06-15 LAB — IRON AND TIBC
Iron: 154 ug/dL (ref 42–163)
Saturation Ratios: 38 % (ref 20–55)
TIBC: 405 ug/dL (ref 202–409)
UIBC: 251 ug/dL (ref 117–376)

## 2019-06-15 LAB — MAGNESIUM: Magnesium: 2 mg/dL (ref 1.7–2.4)

## 2019-06-15 LAB — FERRITIN: Ferritin: 93 ng/mL (ref 24–336)

## 2019-06-15 LAB — SEDIMENTATION RATE: Sed Rate: 9 mm/hr (ref 0–16)

## 2019-06-16 ENCOUNTER — Other Ambulatory Visit: Payer: Self-pay | Admitting: Hematology and Oncology

## 2019-06-16 ENCOUNTER — Other Ambulatory Visit: Payer: Self-pay

## 2019-06-16 DIAGNOSIS — C9001 Multiple myeloma in remission: Secondary | ICD-10-CM

## 2019-06-16 LAB — VITAMIN D 25 HYDROXY (VIT D DEFICIENCY, FRACTURES): Vit D, 25-Hydroxy: 28 ng/mL — ABNORMAL LOW (ref 30.0–100.0)

## 2019-06-20 LAB — KAPPA/LAMBDA LIGHT CHAINS
Kappa free light chain: 21.4 mg/L — ABNORMAL HIGH (ref 3.3–19.4)
Kappa, lambda light chain ratio: 1.06 (ref 0.26–1.65)
Lambda free light chains: 20.2 mg/L (ref 5.7–26.3)

## 2019-06-22 ENCOUNTER — Encounter: Payer: Self-pay | Admitting: Hematology and Oncology

## 2019-06-22 ENCOUNTER — Other Ambulatory Visit: Payer: Self-pay

## 2019-06-22 ENCOUNTER — Inpatient Hospital Stay (HOSPITAL_BASED_OUTPATIENT_CLINIC_OR_DEPARTMENT_OTHER): Payer: BC Managed Care – PPO | Admitting: Hematology and Oncology

## 2019-06-22 DIAGNOSIS — D61818 Other pancytopenia: Secondary | ICD-10-CM | POA: Diagnosis not present

## 2019-06-22 DIAGNOSIS — Z9221 Personal history of antineoplastic chemotherapy: Secondary | ICD-10-CM | POA: Diagnosis not present

## 2019-06-22 DIAGNOSIS — Z7982 Long term (current) use of aspirin: Secondary | ICD-10-CM | POA: Diagnosis not present

## 2019-06-22 DIAGNOSIS — C9001 Multiple myeloma in remission: Secondary | ICD-10-CM

## 2019-06-22 DIAGNOSIS — E559 Vitamin D deficiency, unspecified: Secondary | ICD-10-CM

## 2019-06-22 DIAGNOSIS — Z85828 Personal history of other malignant neoplasm of skin: Secondary | ICD-10-CM

## 2019-06-22 DIAGNOSIS — Z791 Long term (current) use of non-steroidal anti-inflammatories (NSAID): Secondary | ICD-10-CM | POA: Diagnosis not present

## 2019-06-22 DIAGNOSIS — E538 Deficiency of other specified B group vitamins: Secondary | ICD-10-CM | POA: Diagnosis not present

## 2019-06-22 DIAGNOSIS — Z79899 Other long term (current) drug therapy: Secondary | ICD-10-CM | POA: Diagnosis not present

## 2019-06-22 NOTE — Assessment & Plan Note (Addendum)
He has borderline vitamin B12 deficiency I recommend 1 mg vitamin B12 daily It could be the cause of his chronic pancytopenia.  I plan to recheck next few

## 2019-06-22 NOTE — Assessment & Plan Note (Signed)
He denies recurrence of skin cancer We discussed the importance of aggressive skin protection and surveillance by dermatologist

## 2019-06-22 NOTE — Assessment & Plan Note (Signed)
He is deficient in vitamin D I recommend minimum 2000 units of vitamin D daily to prevent risk of fractures.

## 2019-06-22 NOTE — Progress Notes (Signed)
Austin Griffith OFFICE PROGRESS NOTE  Patient Care Team: Sharilyn Sites, MD as PCP - General (Family Medicine) Jeanann Lewandowsky, MD as PCP - Hematology/Oncology (Hematology and Oncology) Melissa Montane, MD as Attending Physician (Otolaryngology) Eppie Gibson, MD (Radiation Oncology) Elayne Snare, MD as Attending Physician (Endocrinology) Carol Ada, MD as Attending Physician (Gastroenterology) Heath Lark, MD as Consulting Physician (Hematology and Oncology)  ASSESSMENT & PLAN:  Multiple myeloma in remission [facility]) I reviewed myeloma panel with the patient Currently, he has no signs of disease recurrence His pancytopenia is unlikely to be related He declined returning back to [facility] for post transplant follow-up From the myeloma standpoint, I plan to see him once a year with history, physical examination and blood work The patient is educated to watch out for signs and symptoms of cancer recurrence We discussed the importance of influenza vaccination  Vitamin D deficiency He is deficient in vitamin D I recommend minimum 2000 units of vitamin D daily to prevent risk of fractures.  B12 deficiency He has borderline vitamin B12 deficiency I recommend 1 mg vitamin B12 daily It could be the cause of his chronic pancytopenia.  I plan to recheck next few  History of skin cancer He denies recurrence of skin cancer We discussed the importance of aggressive skin protection and surveillance by dermatologist   Orders Placed This Encounter  Procedures  . Comprehensive metabolic panel    Standing Status:   Future    Standing Expiration Date:   07/26/2020  . CBC with Differential/Platelet    Standing Status:   Future    Standing Expiration Date:   07/26/2020  . Kappa/lambda light chains    Standing Status:   Future    Standing Expiration Date:   07/26/2020  . Multiple Myeloma Panel (SPEP&IFE w/QIG)    Standing Status:   Future    Standing Expiration Date:   07/26/2020  .  VITAMIN D 25 Hydroxy (Vit-D Deficiency, Fractures)    Standing Status:   Future    Standing Expiration Date:   12/19/2020  . Vitamin B12    Standing Status:   Future    Standing Expiration Date:   07/26/2020    INTERVAL HISTORY: Please see below for problem oriented charting. He returns for myeloma follow-up The patient had recent extensive evaluation earlier this year for TIA He denies recurrence of TIA-like symptoms No recent bone pain No recent infection, fever or chills  SUMMARY OF ONCOLOGIC HISTORY: Oncology History Overview Note  IgG Kappa light chain disease, Durie-Salmon stage III   Multiple myeloma in remission ([place])  01/19/2012 Imaging   The patient has CT scan of the head for evaluation of severe headaches which show abnormalities in the sinus   01/22/2012 Bone Marrow Biopsy   Bone marrow biopsy show only 2% plasma cell   01/22/2012 Imaging   CT scan of the sinus showed Ethmoid mucocele with some expansion of the air cells and soft tissue extending into the upper nasal cavity.    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  No additional areas of abnormalities elsewhere.     05/20/2012 Imaging   Repeat CT scan of the sinuses show complete response to treatment   03/03/2013 Imaging   CT scan of the chest showed fracture of the left seventh rib represents a pathologic  fracture.  There is a destructive soft tissue mass at the site of the fracture worrisome for malignancy.    03/10/2013 Bone Marrow Biopsy   Repeat bone marrow biopsy again initial 2% plasma cell   03/17/2013 Imaging   Repeat PET scan showed numerous lytic myelomatous bone lesions involving the spine, left ribs and left iliac bone. There were also two pancreatic lesions and possibly three liver lesions suspicious for metastatic disease.     03/21/2013  Procedure   Biopsy of the rib lesion came back plasmacytoma   03/24/2013 Procedure   Fine-needle aspirate and biopsy of the pancreatic mass confirmed plasma cell    06/09/2013 - 08/09/2013 Chemotherapy   The patient completed 4 months of induction chemotherapy with Revlimid, Velcade and dexamethasone along with Zometa   08/21/2013 Imaging   Repeat PET scan at [facility] show persistent lytic lesions but there were no abnormalities in the pancreas or liver   09/28/2013 Bone Marrow Transplant   The patient underwent autologous stem cell rescue after conditioning therapy with melphalan   02/26/2015 Miscellaneous   Established oncology care at  [facility]   02/26/2015 Miscellaneous   Non-complicance with appointments: No show on: 03/29/2015, 04/03/2015, 04/25/2015.     REVIEW OF SYSTEMS:   Constitutional: Denies fevers, chills or abnormal weight loss Eyes: Denies blurriness of vision Ears, nose, mouth, throat, and face: Denies mucositis or sore throat Respiratory: Denies cough, dyspnea or wheezes Cardiovascular: Denies palpitation, chest discomfort or lower extremity swelling Gastrointestinal:  Denies nausea, heartburn or change in bowel habits Skin: Denies abnormal skin rashes Lymphatics: Denies new lymphadenopathy or easy bruising Neurological:Denies numbness, tingling or new weaknesses Behavioral/Psych: Mood is stable, no new changes  All other systems were reviewed with the patient and are negative.  I have reviewed the past medical history, past surgical history, social history and family history with the patient and they are unchanged from previous note.  ALLERGIES:  has No Known Allergies.  MEDICATIONS:  Current Outpatient Medications  Medication Sig Dispense Refill  . aspirin 325 MG tablet Take 1 tablet (325 mg total) by mouth daily. 30 tablet 0  . escitalopram (LEXAPRO) 20 MG tablet Take 1 tablet (20 mg total) by mouth daily. 90 tablet 3  . ibuprofen (ADVIL,MOTRIN)  200 MG tablet Take 200 mg by mouth every 6 (six) hours as needed.    . lansoprazole (PREVACID) 15 MG capsule Take 15 mg by mouth daily.    Marland Kitchen loratadine (CLARITIN) 10 MG tablet Take 10 mg by mouth daily.    Marland Kitchen LORazepam (ATIVAN) 1 MG tablet Take 1 tablet (1 mg total) by mouth 2 (two) times daily as needed for anxiety. 20 tablet 0   No current facility-administered medications for this visit.     PHYSICAL EXAMINATION: ECOG PERFORMANCE STATUS: 0 - Asymptomatic Blood pressure 105/66 Heart rate 73 Respiratory rate 18 GENERAL:alert, no distress and comfortable SKIN: skin color, texture, turgor are normal, no rashes or significant lesions EYES: normal, Conjunctiva are pink and non-injected, sclera clear OROPHARYNX:no exudate, no erythema and lips, buccal mucosa, and tongue normal  NECK: supple, thyroid normal size, non-tender, without nodularity LYMPH:  no palpable lymphadenopathy in the cervical, axillary or inguinal LUNGS: clear to auscultation and percussion with normal breathing effort HEART: regular rate & rhythm and no murmurs and no lower extremity edema ABDOMEN:abdomen soft, non-tender and normal bowel sounds Musculoskeletal:no cyanosis of digits and no clubbing  NEURO: alert & oriented x 3 with fluent speech, no focal motor/sensory  deficits  LABORATORY DATA:  I have reviewed the data as listed    Component Value Date/Time   NA 138 11/19/2018 2135   NA 138 06/03/2017 1336   K 3.7 11/19/2018 2135   K 4.0 06/03/2017 1336   CL 105 11/19/2018 2135   CL 105 03/30/2013 1340   CO2 25 11/19/2018 2135   CO2 27 06/03/2017 1336   GLUCOSE 94 11/19/2018 2135   GLUCOSE 107 06/03/2017 1336   GLUCOSE 120 (H) 03/30/2013 1340   BUN 16 11/19/2018 2135   BUN 10.5 06/03/2017 1336   CREATININE 1.12 11/19/2018 2135   CREATININE 1.2 06/03/2017 1336   CALCIUM 9.0 11/19/2018 2135   CALCIUM 9.5 06/03/2017 1336   PROT 6.9 11/19/2018 2135   PROT 7.1 06/03/2017 1336   PROT 6.6 06/03/2017 1336    ALBUMIN 4.0 11/19/2018 2135   ALBUMIN 3.8 06/03/2017 1336   AST 33 11/19/2018 2135   AST 32 06/03/2017 1336   ALT 41 11/19/2018 2135   ALT 38 06/03/2017 1336   ALKPHOS 48 11/19/2018 2135   ALKPHOS 80 06/03/2017 1336   BILITOT 0.8 11/19/2018 2135   BILITOT 0.75 06/03/2017 1336   GFRNONAA >60 11/19/2018 2135   GFRAA >60 11/19/2018 2135    No results found for: SPEP, UPEP  Lab Results  Component Value Date   WBC 3.8 (L) 06/15/2019   NEUTROABS 2.4 06/15/2019   HGB 14.1 06/15/2019   HCT 40.7 06/15/2019   MCV 97.8 06/15/2019   PLT 120 (L) 06/15/2019      Chemistry      Component Value Date/Time   NA 138 11/19/2018 2135   NA 138 06/03/2017 1336   K 3.7 11/19/2018 2135   K 4.0 06/03/2017 1336   CL 105 11/19/2018 2135   CL 105 03/30/2013 1340   CO2 25 11/19/2018 2135   CO2 27 06/03/2017 1336   BUN 16 11/19/2018 2135   BUN 10.5 06/03/2017 1336   CREATININE 1.12 11/19/2018 2135   CREATININE 1.2 06/03/2017 1336      Component Value Date/Time   CALCIUM 9.0 11/19/2018 2135   CALCIUM 9.5 06/03/2017 1336   ALKPHOS 48 11/19/2018 2135   ALKPHOS 80 06/03/2017 1336   AST 33 11/19/2018 2135   AST 32 06/03/2017 1336   ALT 41 11/19/2018 2135   ALT 38 06/03/2017 1336   BILITOT 0.8 11/19/2018 2135   BILITOT 0.75 06/03/2017 1336     All questions were answered. The patient knows to call the clinic with any problems, questions or concerns. No barriers to learning was detected.  I spent 15 minutes counseling the patient face to face. The total time spent in the appointment was 20 minutes and more than 50% was on counseling and review of test results  Heath Lark, MD 06/22/2019 1:13 PM

## 2019-06-22 NOTE — Assessment & Plan Note (Signed)
I reviewed myeloma panel with the patient Currently, he has no signs of disease recurrence His pancytopenia is unlikely to be related He declined returning back to [facility] for post transplant follow-up From the myeloma standpoint, I plan to see him once a year with history, physical examination and blood work The patient is educated to watch out for signs and symptoms of cancer recurrence We discussed the importance of influenza vaccination

## 2019-06-23 ENCOUNTER — Telehealth: Payer: Self-pay | Admitting: Hematology and Oncology

## 2019-06-23 NOTE — Telephone Encounter (Signed)
I left a message regarding schedule  

## 2019-09-18 DIAGNOSIS — R030 Elevated blood-pressure reading, without diagnosis of hypertension: Secondary | ICD-10-CM | POA: Diagnosis not present

## 2019-09-18 DIAGNOSIS — Z20828 Contact with and (suspected) exposure to other viral communicable diseases: Secondary | ICD-10-CM | POA: Diagnosis not present

## 2019-09-21 DIAGNOSIS — C9001 Multiple myeloma in remission: Secondary | ICD-10-CM | POA: Diagnosis not present

## 2019-09-21 DIAGNOSIS — F419 Anxiety disorder, unspecified: Secondary | ICD-10-CM | POA: Diagnosis not present

## 2019-09-21 DIAGNOSIS — F329 Major depressive disorder, single episode, unspecified: Secondary | ICD-10-CM | POA: Diagnosis not present

## 2020-04-22 DIAGNOSIS — H0231 Blepharochalasis right upper eyelid: Secondary | ICD-10-CM | POA: Diagnosis not present

## 2020-04-22 DIAGNOSIS — H40013 Open angle with borderline findings, low risk, bilateral: Secondary | ICD-10-CM | POA: Diagnosis not present

## 2020-04-22 DIAGNOSIS — H3561 Retinal hemorrhage, right eye: Secondary | ICD-10-CM | POA: Diagnosis not present

## 2020-04-22 DIAGNOSIS — H26492 Other secondary cataract, left eye: Secondary | ICD-10-CM | POA: Diagnosis not present

## 2020-04-29 ENCOUNTER — Other Ambulatory Visit: Payer: Self-pay

## 2020-04-29 ENCOUNTER — Encounter (INDEPENDENT_AMBULATORY_CARE_PROVIDER_SITE_OTHER): Payer: Self-pay | Admitting: Ophthalmology

## 2020-04-29 ENCOUNTER — Ambulatory Visit (INDEPENDENT_AMBULATORY_CARE_PROVIDER_SITE_OTHER): Payer: BC Managed Care – PPO | Admitting: Ophthalmology

## 2020-04-29 DIAGNOSIS — Z711 Person with feared health complaint in whom no diagnosis is made: Secondary | ICD-10-CM

## 2020-04-29 NOTE — Patient Instructions (Addendum)
Upon dictation from the patient he was also recorded with his phone, I explained that I do not believe this will be a mutually beneficial relationship, since anything that happened to him with his dilated eye is "it was going to be our fault".  Plan to the patient that dilation eyes are done often and that it has to be mutually understood that there is minimal to almost no risk.  Has had cataract surgery.  At this junction the patient stood up and departed the office and verbally issued a  threat and he was not the one who would need to Dr. He departed the office  No clinical exam occurred.    As the patient was departing the office in the hallway the patient was overheard by staff members to say "he was not who was going to need  a doctor".  At this point the office contacted [employer] notify them of the unpleasant encounter in the office staff felt threatened so much that the doors were locked in 1 patient of the time was allowed in and out

## 2020-04-30 ENCOUNTER — Encounter (INDEPENDENT_AMBULATORY_CARE_PROVIDER_SITE_OTHER): Payer: Self-pay | Admitting: Ophthalmology

## 2020-04-30 NOTE — Progress Notes (Signed)
04/29/2020     CHIEF COMPLAINT Patient presents for Retina Evaluation   HISTORY OF PRESENT ILLNESS: Austin Griffith is a 59 y.o. male who presents to the clinic today for:   HPI    Retina Evaluation    In right eye.  Duration of 1 week.  Associated Symptoms Negative for Flashes and Floaters.  Context:  distance vision.  Treatments tried include no treatments.          Comments    Referred by [facility] heme OD Patient states that he is not currently having any visual issues but Dr. Katy Fitch found a problem in his right eye. No complaints.  **Patient refused dilation, stated that 'I go blind when I am dilated". Patient became irate when we asked if we could get pictures and have him talk with Dr. Zadie Rhine first. Patient stated several times "If my time is wasted, it is not going to be good for you all."  After discussion with the patient and I explained to the patient that dilation would be not necessary in order to view the funduscopic evaluation. The patient turned on his phone to record the conversation. I feel again important that this patient have the care he requires he is asked for guarantee that there will be no side effects including blindness.  I explained the patient that blindness on these eyedrops would not cause turn the lights out "" blindness" that might indeed cause 3 to 5 hours of blurred vision.  The patient then reports that he nearly went blind after dilation by [facility] some years ago and loss of peripheral vision because of the cancer effect of multiple myeloma.  Explained to the patient that the multiple myeloma in the eye certainly can have an effect but that typically dilation does not have an effect from that.  It is unclear to me currently if the patient truly wants to proceed with dilation when in fact he does say he wants it he just wants to half the office to be responsible if he has a side effect.  Reporting or a potentially beneficial  interaction and that so I will asked the patient to seek care elsewhere.  This conversation was was dictated in front of all staff members and while leaving the patient rather than the threat of the ongoing #1 he needs the doctor "".       Last edited by Hurman Horn, MD on 04/29/2020  2:37 PM. (History)      Referring physician: Sharilyn Sites, MD [address] [place],   [id number]  HISTORICAL INFORMATION:   Selected notes from the MEDICAL RECORD NUMBER    Lab Results  Component Value Date   HGBA1C 5.7 (H) 11/20/2018     CURRENT MEDICATIONS: Current Outpatient Medications (Ophthalmic Drugs)  Medication Sig   BEPREVE 1.5 % SOLN    No current facility-administered medications for this visit. (Ophthalmic Drugs)   Current Outpatient Medications (Other)  Medication Sig   aspirin 325 MG tablet Take 1 tablet (325 mg total) by mouth daily.   escitalopram (LEXAPRO) 20 MG tablet Take 1 tablet (20 mg total) by mouth daily.   ibuprofen (ADVIL,MOTRIN) 200 MG tablet Take 200 mg by mouth every 6 (six) hours as needed.   lansoprazole (PREVACID) 15 MG capsule Take 15 mg by mouth daily.   loratadine (CLARITIN) 10 MG tablet Take 10 mg by mouth daily.   LORazepam (ATIVAN) 1 MG tablet Take 1 tablet (1 mg  total) by mouth 2 (two) times daily as needed for anxiety.   No current facility-administered medications for this visit. 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 ([place]) 04/10/2013   Fatigue    Fatigue 05/25/12   "Mild"    Headache(784.0) 02/08/2012   Right Sided Headache Behind Right Eye   History of radiation therapy 02/29/12-04/08/12   right  ethmoid sinus in 50.4Gy in 20 fxs   Hyperlipidemia    diet control    Hypertension    Knee pain 10/18/2013   Liver lesion 03/31/13   MR Abdomen   Medically noncompliant 09/29/2015   Multiple myeloma in remission ([place]) 11/22/2013   Multiple myeloma, in relapse 07/05/2013   Nausea alone 09/12/2013   Neutropenic fever ([place]) 09/18/2013   On antineoplastic chemotherapy started 04/08/13   Revlimid/ Velcade/ Dex   Pancreatic lesion 03/31/13    MR Abdomen - Ucinate Process   Plasmacytoma ([place])     Right Ethmoid Sinus; bone marrow biopsy on 03/10/13 showed normal myeloma FISH panel and cytogenetics.    S/P radiation therapy 03/28/2013   Left posterior 7th Rib / 8Gy in 1 fraction   Shingles Feb 24, 2015   Thrombocytopenia ([place]) 05/25/12   Past Surgical History:  Procedure Laterality Date   Bilateral hip replacement  1991; and 2009   due to bilateral hip AVN's. / reports left hip was not replaced   EUS N/A 03/24/2013   Procedure: UPPER ENDOSCOPIC ULTRASOUND (EUS) LINEAR;  Surgeon: Beryle Beams, MD;  Location: [facility];  Service: Endoscopy;  Laterality: N/A;   FINE NEEDLE ASPIRATION N/A 03/24/2013   Procedure: FINE NEEDLE ASPIRATION (FNA) LINEAR;  Surgeon: Beryle Beams, MD;  Location: [facility] ENDOS[facility];  Service: Endoscopy;  Laterality: N/A;   MASS BIOPSY  01/26/12   Right Ethmoid Mass - Plasma Cell Neoplasm    FAMILY HISTORY Family History  Adopted: Yes    SOCIAL HISTORY Social History   Tobacco Use   Smoking status: Never Smoker   Smokeless tobacco: Never Used  Vaping Use   Vaping Use: Never used  Substance Use Topics   Alcohol use: Yes    Comment: Hx of Alcohol Abuse.09/18/13 denies but says drinks etoh   Drug use: No         OPHTHALMIC EXAM:  Base Eye Exam    Visual Acuity (Snellen - Linear)      Right Left   Dist [place] 20/30+1  20/25-2       Tonometry (Tonopen, 2:12 PM)      Right Left   Pressure 7 9       Pupils      Pupils Dark Light Shape React APD   Right PERRL 5 4 Round Slow None   Left PERRL 5 4 Round Slow None       Visual Fields (Counting fingers)      Left Right    Full Full       Extraocular Movement  Right Left    Full Full       Neuro/Psych    Oriented x3: Yes   Mood/Affect: Normal        Slit Lamp and Fundus Exam    External Exam      Right Left   External Normal Normal       Slit Lamp Exam      Right Left   Lids/Lashes Normal Normal   Conjunctiva/Sclera White and quiet White and quiet   Cornea Clear Clear   Anterior Chamber Deep and quiet Deep and quiet   Iris Round and reactive Round and reactive   Lens Posterior chamber intraocular lens Posterior chamber intraocular lens   Vitreous Normal Normal          IMAGING AND PROCEDURES  Imaging and Procedures for 04/30/20           ASSESSMENT/PLAN:  No problem-specific Assessment & Plan notes found for this encounter.    No diagnosis found.  1.  2.  3.  Ophthalmic Meds Ordered this visit:  No orders of the defined types were placed in this encounter.      Return for Patient will be asked not to return to the practice due to threatening behavior and words.  Patient Instructions  Upon dictation from the patient he was also recorded with his phone, I explained that I do not believe this will be a mutually beneficial relationship, since anything that happened to him with his dilated eye is "it was going to be our fault".  Plan to the patient that dilation eyes are done often and that it has to be mutually understood that there is minimal to almost no risk.  Has had cataract surgery.  At this junction the patient stood up and departed the office and verbally issued a  threat and he was not the one who would need to Dr. He departed the office  No clinical exam occurred.    As the patient was  departing the office in the hallway the patient was overheard by staff members to say "he was not who was going to need  a doctor".  At this point the office contacted [employer] security notify them of the unpleasant encounter in the office staff felt threatened so much that the doors were locked in 1 patient of the time was allowed in and out    Explained the diagnoses, plan, and follow up with the patient and they expressed understanding.  Patient expressed understanding of the importance of proper follow up care.   Austin Griffith M.D. Diseases & Surgery of the Retina and Vitreous [facility]ina [facility] Diabetic [place] 04/30/20     Abbreviations: M myopia (nearsighted); A astigmatism; H hyperopia (farsighted); P presbyopia; Mrx spectacle prescription;  CTL contact lenses; OD right eye; OS left eye; OU both eyes  XT exotropia; ET esotropia; PEK punctate epithelial keratitis; PEE punctate epithelial erosions; DES dry eye syndrome; MGD meibomian gland dysfunction; ATs artificial tears; PFAT's preservative free artificial tears; [place] nuclear sclerotic cataract; PSC posterior subcapsular cataract; ERM epi-retinal membrane; PVD posterior vitreous detachment; RD retinal detachment; DM diabetes mellitus; DR diabetic retinopathy; NPDR non-proliferative diabetic retinopathy; PDR proliferative diabetic retinopathy; CSME clinically significant macular edema; DME diabetic macular edema; dbh dot blot hemorrhages; CWS cotton wool spot; POAG primary open angle glaucoma; C/D cup-to-disc ratio; HVF humphrey visual field; GVF goldmann visual field; OCT optical coherence tomography; IOP intraocular pressure; BRVO Branch retinal vein occlusion; CRVO central retinal vein occlusion; CRAO  central retinal artery occlusion; BRAO branch retinal artery occlusion; RT retinal tear; SB scleral buckle; PPV pars plana vitrectomy; VH Vitreous hemorrhage; PRP panretinal laser photocoagulation; IVK intravitreal kenalog; VMT vitreomacular  traction; MH Macular hole;  NVD neovascularization of the disc; NVE neovascularization elsewhere; AREDS age related eye disease study; ARMD age related macular degeneration; POAG primary open angle glaucoma; EBMD epithelial/anterior basement membrane dystrophy; ACIOL anterior chamber intraocular lens; IOL intraocular lens; PCIOL posterior chamber intraocular lens; Phaco/IOL phacoemulsification with intraocular lens placement; [place] photorefractive keratectomy; LASIK laser assisted in situ keratomileusis; HTN hypertension; DM diabetes mellitus; COPD chronic obstructive pulmonary disease

## 2020-05-03 DIAGNOSIS — H26493 Other secondary cataract, bilateral: Secondary | ICD-10-CM | POA: Diagnosis not present

## 2020-05-03 DIAGNOSIS — Z961 Presence of intraocular lens: Secondary | ICD-10-CM | POA: Diagnosis not present

## 2020-05-03 DIAGNOSIS — H1013 Acute atopic conjunctivitis, bilateral: Secondary | ICD-10-CM | POA: Diagnosis not present

## 2020-05-28 DIAGNOSIS — C9020 Extramedullary plasmacytoma not having achieved remission: Secondary | ICD-10-CM | POA: Diagnosis not present

## 2020-05-28 DIAGNOSIS — E291 Testicular hypofunction: Secondary | ICD-10-CM | POA: Diagnosis not present

## 2020-05-28 DIAGNOSIS — C9001 Multiple myeloma in remission: Secondary | ICD-10-CM | POA: Diagnosis not present

## 2020-05-28 DIAGNOSIS — F419 Anxiety disorder, unspecified: Secondary | ICD-10-CM | POA: Diagnosis not present

## 2020-05-28 DIAGNOSIS — I10 Essential (primary) hypertension: Secondary | ICD-10-CM | POA: Diagnosis not present

## 2020-05-28 DIAGNOSIS — N529 Male erectile dysfunction, unspecified: Secondary | ICD-10-CM | POA: Diagnosis not present

## 2020-05-28 DIAGNOSIS — Z6830 Body mass index (BMI) 30.0-30.9, adult: Secondary | ICD-10-CM | POA: Diagnosis not present

## 2020-05-28 DIAGNOSIS — Z1389 Encounter for screening for other disorder: Secondary | ICD-10-CM | POA: Diagnosis not present

## 2020-05-28 DIAGNOSIS — E7849 Other hyperlipidemia: Secondary | ICD-10-CM | POA: Diagnosis not present

## 2020-05-28 DIAGNOSIS — Z0001 Encounter for general adult medical examination with abnormal findings: Secondary | ICD-10-CM | POA: Diagnosis not present

## 2020-05-28 DIAGNOSIS — E669 Obesity, unspecified: Secondary | ICD-10-CM | POA: Diagnosis not present

## 2020-06-12 ENCOUNTER — Telehealth: Payer: Self-pay

## 2020-06-12 NOTE — Telephone Encounter (Signed)
Pt called and LVM stating he missed a call from [facility], and understands he needs a f/u annual appt. Pt states it is OK to call him and LVM about date/time of appt if he does not answer. Message sent to scheduling regarding this.

## 2020-06-13 ENCOUNTER — Telehealth: Payer: Self-pay | Admitting: Hematology and Oncology

## 2020-06-13 NOTE — Telephone Encounter (Signed)
Left message for patient per 9/1 sch msg-

## 2020-06-14 ENCOUNTER — Inpatient Hospital Stay: Payer: BC Managed Care – PPO

## 2020-06-14 ENCOUNTER — Telehealth: Payer: Self-pay | Admitting: Hematology and Oncology

## 2020-06-14 NOTE — Telephone Encounter (Signed)
Per sch msg, r/s 9/3 appt. Called and left msg

## 2020-06-18 ENCOUNTER — Inpatient Hospital Stay: Payer: BC Managed Care – PPO | Attending: Hematology and Oncology

## 2020-06-18 ENCOUNTER — Other Ambulatory Visit: Payer: Self-pay

## 2020-06-18 DIAGNOSIS — H538 Other visual disturbances: Secondary | ICD-10-CM | POA: Insufficient documentation

## 2020-06-18 DIAGNOSIS — E559 Vitamin D deficiency, unspecified: Secondary | ICD-10-CM

## 2020-06-18 DIAGNOSIS — Z9481 Bone marrow transplant status: Secondary | ICD-10-CM | POA: Diagnosis not present

## 2020-06-18 DIAGNOSIS — E538 Deficiency of other specified B group vitamins: Secondary | ICD-10-CM | POA: Diagnosis not present

## 2020-06-18 DIAGNOSIS — D61818 Other pancytopenia: Secondary | ICD-10-CM | POA: Insufficient documentation

## 2020-06-18 DIAGNOSIS — C9001 Multiple myeloma in remission: Secondary | ICD-10-CM | POA: Insufficient documentation

## 2020-06-18 LAB — CBC WITH DIFFERENTIAL/PLATELET
Abs Immature Granulocytes: 0.01 10*3/uL (ref 0.00–0.07)
Basophils Absolute: 0 10*3/uL (ref 0.0–0.1)
Basophils Relative: 0 %
Eosinophils Absolute: 0.1 10*3/uL (ref 0.0–0.5)
Eosinophils Relative: 4 %
HCT: 34 % — ABNORMAL LOW (ref 39.0–52.0)
Hemoglobin: 12.3 g/dL — ABNORMAL LOW (ref 13.0–17.0)
Immature Granulocytes: 0 %
Lymphocytes Relative: 18 %
Lymphs Abs: 0.7 10*3/uL (ref 0.7–4.0)
MCH: 33.9 pg (ref 26.0–34.0)
MCHC: 36.2 g/dL — ABNORMAL HIGH (ref 30.0–36.0)
MCV: 93.7 fL (ref 80.0–100.0)
Monocytes Absolute: 0.4 10*3/uL (ref 0.1–1.0)
Monocytes Relative: 12 %
Neutro Abs: 2.4 10*3/uL (ref 1.7–7.7)
Neutrophils Relative %: 66 %
Platelets: 103 10*3/uL — ABNORMAL LOW (ref 150–400)
RBC: 3.63 MIL/uL — ABNORMAL LOW (ref 4.22–5.81)
RDW: 12 % (ref 11.5–15.5)
WBC: 3.6 10*3/uL — ABNORMAL LOW (ref 4.0–10.5)
nRBC: 0 % (ref 0.0–0.2)

## 2020-06-18 LAB — COMPREHENSIVE METABOLIC PANEL
ALT: 30 U/L (ref 0–44)
AST: 26 U/L (ref 15–41)
Albumin: 3.8 g/dL (ref 3.5–5.0)
Alkaline Phosphatase: 73 U/L (ref 38–126)
Anion gap: 5 (ref 5–15)
BUN: 17 mg/dL (ref 6–20)
CO2: 26 mmol/L (ref 22–32)
Calcium: 9.3 mg/dL (ref 8.9–10.3)
Chloride: 105 mmol/L (ref 98–111)
Creatinine, Ser: 1.21 mg/dL (ref 0.61–1.24)
GFR calc Af Amer: >60 mL/min (ref >60)
GFR calc non Af Amer: >60 mL/min (ref >60)
Glucose, Bld: 81 mg/dL (ref 70–99)
Potassium: 3.8 mmol/L (ref 3.5–5.1)
Sodium: 136 mmol/L (ref 135–145)
Total Bilirubin: 0.7 mg/dL (ref 0.3–1.2)
Total Protein: 7.3 g/dL (ref 6.5–8.1)

## 2020-06-18 LAB — VITAMIN D 25 HYDROXY (VIT D DEFICIENCY, FRACTURES): Vit D, 25-Hydroxy: 32.38 ng/mL (ref 30–100)

## 2020-06-18 LAB — VITAMIN B12: Vitamin B-12: 198 pg/mL (ref 180–914)

## 2020-06-19 LAB — KAPPA/LAMBDA LIGHT CHAINS
Kappa free light chain: 27.4 mg/L — ABNORMAL HIGH (ref 3.3–19.4)
Kappa, lambda light chain ratio: 1.13 (ref 0.26–1.65)
Lambda free light chains: 24.2 mg/L (ref 5.7–26.3)

## 2020-06-20 LAB — MULTIPLE MYELOMA PANEL, SERUM
Albumin SerPl Elph-Mcnc: 3.6 g/dL (ref 2.9–4.4)
Albumin/Glob SerPl: 1.3 (ref 0.7–1.7)
Alpha 1: 0.2 g/dL (ref 0.0–0.4)
Alpha2 Glob SerPl Elph-Mcnc: 0.6 g/dL (ref 0.4–1.0)
B-Globulin SerPl Elph-Mcnc: 1.2 g/dL (ref 0.7–1.3)
Gamma Glob SerPl Elph-Mcnc: 0.8 g/dL (ref 0.4–1.8)
Globulin, Total: 2.9 g/dL (ref 2.2–3.9)
IgA: 531 mg/dL — ABNORMAL HIGH (ref 90–386)
IgG (Immunoglobin G), Serum: 784 mg/dL (ref 603–1613)
IgM (Immunoglobulin M), Srm: 100 mg/dL (ref 20–172)
Total Protein ELP: 6.5 g/dL (ref 6.0–8.5)

## 2020-06-21 ENCOUNTER — Encounter: Payer: Self-pay | Admitting: Hematology and Oncology

## 2020-06-21 ENCOUNTER — Inpatient Hospital Stay: Payer: BC Managed Care – PPO

## 2020-06-21 ENCOUNTER — Other Ambulatory Visit: Payer: Self-pay

## 2020-06-21 ENCOUNTER — Inpatient Hospital Stay: Payer: BC Managed Care – PPO | Admitting: Hematology and Oncology

## 2020-06-21 VITALS — BP 105/70 | HR 63 | Temp 97.8°F | Resp 18 | Ht 77.0 in | Wt 259.4 lb

## 2020-06-21 DIAGNOSIS — D61818 Other pancytopenia: Secondary | ICD-10-CM

## 2020-06-21 DIAGNOSIS — C9001 Multiple myeloma in remission: Secondary | ICD-10-CM

## 2020-06-21 DIAGNOSIS — Z9481 Bone marrow transplant status: Secondary | ICD-10-CM

## 2020-06-21 DIAGNOSIS — H538 Other visual disturbances: Secondary | ICD-10-CM | POA: Diagnosis not present

## 2020-06-21 DIAGNOSIS — Z23 Encounter for immunization: Secondary | ICD-10-CM

## 2020-06-21 DIAGNOSIS — E538 Deficiency of other specified B group vitamins: Secondary | ICD-10-CM | POA: Diagnosis not present

## 2020-06-21 NOTE — Progress Notes (Signed)
   Covid-19 Vaccination Clinic  Name:  REO PORTELA    MRN: [id number] DOB: April 03, 1961  06/21/2020  Mr. Landress was observed post Covid-19 immunization for 15 minutes without incident. He was provided with Vaccine Information Sheet and instruction to access the V-Safe system.   Mr. Volkert was instructed to call 911 with any severe reactions post vaccine: Marland Kitchen Difficulty breathing  . Swelling of face and throat  . A fast heartbeat  . A bad rash all over body  . Dizziness and weakness   Immunizations Administered    Name Date Dose VIS Date Route   Pfizer COVID-19 Vaccine 06/21/2020  2:04 PM 0.3 mL 12/06/2018 Intramuscular   Manufacturer: [place]   Lot: [id number]   [place]: [id number]

## 2020-06-21 NOTE — Assessment & Plan Note (Signed)
We discussed the importance of preventive care and reviewed the vaccination programs.  He agrees to proceed with first dose of Covid-19 vaccination today and we will administer it today at the clinic.

## 2020-06-21 NOTE — Assessment & Plan Note (Signed)
I reviewed myeloma panel with the patient Currently, he has no signs of disease recurrence His pancytopenia is unlikely to be related He declined returning back to [facility] for post transplant follow-up From the myeloma standpoint, I plan to see him once a year with history, physical examination and blood work The patient is educated to watch out for signs and symptoms of cancer recurrence 

## 2020-06-21 NOTE — Assessment & Plan Note (Signed)
He has borderline B12 deficiency causing pancytopenia I recommend oral vitamin B12 supplement daily I plan to recheck again next year 

## 2020-06-21 NOTE — Assessment & Plan Note (Signed)
It is possible he had accelerated cataracts due to prior treatment There is no contraindication for him to proceed with surgery if indicated

## 2020-06-21 NOTE — Assessment & Plan Note (Signed)
He has mild pancytopenia likely due to borderline vitamin B12 deficiency I recommend high-dose vitamin B12 supplement 

## 2020-06-21 NOTE — Progress Notes (Signed)
[place] OFFICE PROGRESS NOTE  Patient Care Team: Sharilyn Sites, MD as PCP - General (Family Medicine) Jeanann Lewandowsky, MD as PCP - Hematology/Oncology (Hematology and Oncology) Melissa Montane, MD as Attending Physician (Otolaryngology) Eppie Gibson, MD (Radiation Oncology) Elayne Snare, MD as Attending Physician (Endocrinology) Carol Ada, MD as Attending Physician (Gastroenterology) Heath Lark, MD as Consulting Physician (Hematology and Oncology)  ASSESSMENT & PLAN:  Multiple myeloma in remission [facility]) I reviewed myeloma panel with the patient Currently, he has no signs of disease recurrence His pancytopenia is unlikely to be related He declined returning back to [facility] for post transplant follow-up From the myeloma standpoint, I plan to see him once a year with history, physical examination and blood work The patient is educated to watch out for signs and symptoms of cancer recurrence  B12 deficiency He has borderline B12 deficiency causing pancytopenia I recommend oral vitamin B12 supplement daily I plan to recheck again next year  Pancytopenia, acquired [facility]) He has mild pancytopenia likely due to borderline vitamin B12 deficiency I recommend high-dose vitamin B12 supplement  S/P bone marrow transplant [facility]) We discussed the importance of preventive care and reviewed the vaccination programs.  He agrees to proceed with first dose of Covid-19 vaccination today and we will administer it today at the clinic.   Blurred vision It is possible he had accelerated cataracts due to prior treatment There is no contraindication for him to proceed with surgery if indicated   Orders Placed This Encounter  Procedures  . CBC with Differential/Platelet    Standing Status:   Standing    Number of Occurrences:   22    Standing Expiration Date:   06/21/2021  . Comprehensive metabolic panel    Standing Status:   Standing    Number of Occurrences:   33    Standing  Expiration Date:   06/21/2021  . Kappa/lambda light chains    Standing Status:   Standing    Number of Occurrences:   22    Standing Expiration Date:   06/21/2021  . Multiple Myeloma Panel (SPEP&IFE w/QIG)    Standing Status:   Standing    Number of Occurrences:   22    Standing Expiration Date:   06/21/2021  . Vitamin B12    Standing Status:   Future    Standing Expiration Date:   06/21/2021    All questions were answered. The patient knows to call the clinic with any problems, questions or concerns. The total time spent in the appointment was 20 minutes encounter with patients including review of chart and various tests results, discussions about plan of care and coordination of care plan   Heath Lark, MD 06/21/2020 2:49 PM  INTERVAL HISTORY: Please see below for problem oriented charting. He returns for further follow-up He is doing well No recent back pain No recent infection, fever or chills He is wondering whether he should proceed with cataract surgery  SUMMARY OF ONCOLOGIC HISTORY: Oncology History Overview Note  IgG Kappa light chain disease, Durie-Salmon stage III   Multiple myeloma in remission ([place])  01/19/2012 Imaging   The patient has CT scan of the head for evaluation of severe headaches which show abnormalities in the sinus   01/22/2012 Bone Marrow Biopsy   Bone marrow biopsy show only 2% plasma cell   01/22/2012 Imaging   CT scan of the sinus showed Ethmoid mucocele with some expansion of the air cells and soft tissue extending into the upper nasal cavity.  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  No additional areas of abnormalities elsewhere.     05/20/2012 Imaging   Repeat CT scan of the sinuses show complete response to treatment   03/03/2013 Imaging   CT scan of the chest showed fracture of  the left seventh rib represents a pathologic fracture.  There is a destructive soft tissue mass at the site of the fracture worrisome for malignancy.    03/10/2013 Bone Marrow Biopsy   Repeat bone marrow biopsy again initial 2% plasma cell   03/17/2013 Imaging   Repeat PET scan showed numerous lytic myelomatous bone lesions involving the spine, left ribs and left iliac bone. There were also two pancreatic lesions and possibly three liver lesions suspicious for metastatic disease.     03/21/2013 Procedure   Biopsy of the rib lesion came back plasmacytoma   03/24/2013 Procedure   Fine-needle aspirate and biopsy of the pancreatic mass confirmed plasma cell    06/09/2013 - 08/09/2013 Chemotherapy   The patient completed 4 months of induction chemotherapy with Revlimid, Velcade and dexamethasone along with Zometa   08/21/2013 Imaging   Repeat PET scan at [facility] show persistent lytic lesions but there were no abnormalities in the pancreas or liver   09/28/2013 Bone Marrow Transplant   The patient underwent autologous stem cell rescue after conditioning therapy with melphalan   02/26/2015 Miscellaneous   Established oncology care at  [facility]   02/26/2015 Miscellaneous   Non-complicance with appointments: No show on: 03/29/2015, 04/03/2015, 04/25/2015.     REVIEW OF SYSTEMS:   Constitutional: Denies fevers, chills or abnormal weight loss Ears, nose, mouth, throat, and face: Denies mucositis or sore throat Respiratory: Denies cough, dyspnea or wheezes Cardiovascular: Denies palpitation, chest discomfort or lower extremity swelling Gastrointestinal:  Denies nausea, heartburn or change in bowel habits Skin: Denies abnormal skin rashes Lymphatics: Denies new lymphadenopathy or easy bruising Neurological:Denies numbness, tingling or new weaknesses Behavioral/Psych: Mood is stable, no new changes  All other systems were reviewed with the patient and are negative.  I have reviewed  the past medical history, past surgical history, social history and family history with the patient and they are unchanged from previous note.  ALLERGIES:  has No Known Allergies.  MEDICATIONS:  Current Outpatient Medications  Medication Sig Dispense Refill  . escitalopram (LEXAPRO) 20 MG tablet Take 1 tablet (20 mg total) by mouth daily. 90 tablet 3  . ibuprofen (ADVIL,MOTRIN) 200 MG tablet Take 200 mg by mouth every 6 (six) hours as needed.    . lansoprazole (PREVACID) 15 MG capsule Take 15 mg by mouth daily.    Marland Kitchen loratadine (CLARITIN) 10 MG tablet Take 10 mg by mouth daily.    Marland Kitchen LORazepam (ATIVAN) 1 MG tablet Take 1 tablet (1 mg total) by mouth 2 (two) times daily as needed for anxiety. 20 tablet 0   No current facility-administered medications for this visit.    PHYSICAL EXAMINATION: ECOG PERFORMANCE STATUS: 0 - Asymptomatic  Vitals:   06/21/20 1337  BP: 105/70  Pulse: 63  Resp: 18  Temp: 97.8 F (36.6 C)  SpO2: 99%   Filed Weights   06/21/20 1337  Weight: 259 lb 6.4 oz (117.7 kg)    GENERAL:alert, no distress and comfortable Musculoskeletal:no cyanosis of digits and no  clubbing  NEURO: alert & oriented x 3 with fluent speech, no focal motor/sensory deficits  LABORATORY DATA:  I have reviewed the data as listed    Component Value Date/Time   NA 136 06/18/2020 1318   NA 138 06/03/2017 1336   K 3.8 06/18/2020 1318   K 4.0 06/03/2017 1336   CL 105 06/18/2020 1318   CL 105 03/30/2013 1340   CO2 26 06/18/2020 1318   CO2 27 06/03/2017 1336   GLUCOSE 81 06/18/2020 1318   GLUCOSE 107 06/03/2017 1336   GLUCOSE 120 (H) 03/30/2013 1340   BUN 17 06/18/2020 1318   BUN 10.5 06/03/2017 1336   CREATININE 1.21 06/18/2020 1318   CREATININE 1.2 06/03/2017 1336   CALCIUM 9.3 06/18/2020 1318   CALCIUM 9.5 06/03/2017 1336   PROT 7.3 06/18/2020 1318   PROT 7.1 06/03/2017 1336   PROT 6.6 06/03/2017 1336   ALBUMIN 3.8 06/18/2020 1318   ALBUMIN 3.8 06/03/2017 1336   AST 26  06/18/2020 1318   AST 32 06/03/2017 1336   ALT 30 06/18/2020 1318   ALT 38 06/03/2017 1336   ALKPHOS 73 06/18/2020 1318   ALKPHOS 80 06/03/2017 1336   BILITOT 0.7 06/18/2020 1318   BILITOT 0.75 06/03/2017 1336   GFRNONAA >60 06/18/2020 1318   GFRAA >60 06/18/2020 1318    No results found for: SPEP, UPEP  Lab Results  Component Value Date   WBC 3.6 (L) 06/18/2020   NEUTROABS 2.4 06/18/2020   HGB 12.3 (L) 06/18/2020   HCT 34.0 (L) 06/18/2020   MCV 93.7 06/18/2020   PLT 103 (L) 06/18/2020      Chemistry      Component Value Date/Time   NA 136 06/18/2020 1318   NA 138 06/03/2017 1336   K 3.8 06/18/2020 1318   K 4.0 06/03/2017 1336   CL 105 06/18/2020 1318   CL 105 03/30/2013 1340   CO2 26 06/18/2020 1318   CO2 27 06/03/2017 1336   BUN 17 06/18/2020 1318   BUN 10.5 06/03/2017 1336   CREATININE 1.21 06/18/2020 1318   CREATININE 1.2 06/03/2017 1336      Component Value Date/Time   CALCIUM 9.3 06/18/2020 1318   CALCIUM 9.5 06/03/2017 1336   ALKPHOS 73 06/18/2020 1318   ALKPHOS 80 06/03/2017 1336   AST 26 06/18/2020 1318   AST 32 06/03/2017 1336   ALT 30 06/18/2020 1318   ALT 38 06/03/2017 1336   BILITOT 0.7 06/18/2020 1318   BILITOT 0.75 06/03/2017 1336

## 2020-07-15 ENCOUNTER — Ambulatory Visit: Payer: BC Managed Care – PPO

## 2020-07-22 ENCOUNTER — Inpatient Hospital Stay: Payer: BC Managed Care – PPO | Attending: Hematology and Oncology

## 2020-07-22 ENCOUNTER — Other Ambulatory Visit: Payer: Self-pay

## 2020-07-22 DIAGNOSIS — C9001 Multiple myeloma in remission: Secondary | ICD-10-CM | POA: Insufficient documentation

## 2020-07-22 DIAGNOSIS — Z23 Encounter for immunization: Secondary | ICD-10-CM | POA: Insufficient documentation

## 2020-07-22 NOTE — Progress Notes (Signed)
   Covid-19 Vaccination Clinic  Name:  Austin Griffith    MRN: [id number] DOB: [birth year]  07/22/2020  Mr. Austin Griffith was observed post Covid-19 immunization for 15 minutes without incident. He was provided with Vaccine Information Sheet and instruction to access the V-Safe system.   Austin Griffith was instructed to call 911 with any severe reactions post vaccine: Marland Kitchen Difficulty breathing  . Swelling of face and throat  . A fast heartbeat  . A bad rash all over body  . Dizziness and weakness   Immunizations Administered    Name Date Dose VIS Date Route   Pfizer COVID-19 Vaccine 07/22/2020  2:39 PM 0.3 mL 12/06/2018 Intramuscular   Manufacturer: [place]   Lot: [id number]   [place]: [id number]

## 2020-08-02 DIAGNOSIS — H469 Unspecified optic neuritis: Secondary | ICD-10-CM | POA: Diagnosis not present

## 2020-08-02 DIAGNOSIS — H26493 Other secondary cataract, bilateral: Secondary | ICD-10-CM | POA: Diagnosis not present

## 2020-08-02 DIAGNOSIS — H1013 Acute atopic conjunctivitis, bilateral: Secondary | ICD-10-CM | POA: Diagnosis not present

## 2020-08-02 DIAGNOSIS — Z961 Presence of intraocular lens: Secondary | ICD-10-CM | POA: Diagnosis not present

## 2020-08-02 DIAGNOSIS — H3500 Unspecified background retinopathy: Secondary | ICD-10-CM | POA: Diagnosis not present

## 2020-08-02 DIAGNOSIS — Z923 Personal history of irradiation: Secondary | ICD-10-CM | POA: Diagnosis not present

## 2020-08-06 ENCOUNTER — Telehealth: Payer: Self-pay

## 2020-08-06 NOTE — Telephone Encounter (Signed)
Called back and given below message. He verbalized understanding. Dr. Koren Bound at [facility] wanted to run a lot of tests and he does not want to do those tests. He just wants to get the laser procedure done without the tests. He is asking if you could contact Dr. Earnie Larsson or write a letter with the above information?

## 2020-08-06 NOTE — Telephone Encounter (Signed)
His eye problem was probably made worse in the past due to chemo His last myeloma panel looks good, nothing to suggest recurrent disease I suspect it was caused by his past treatment, nothing we can do from the hematology perspective now

## 2020-08-06 NOTE — Telephone Encounter (Signed)
Called and given below message. He verbalized understanding. He will ask if Dr. Earnie Larsson will contact Dr. Alvy Bimler.

## 2020-08-06 NOTE — Telephone Encounter (Signed)
I am not sure what the doctor is ordering I can take a call from her if she wants information from me

## 2020-08-06 NOTE — Telephone Encounter (Signed)
He called and left a message. At his last visit with Dr. Alvy Bimler he discussed having laser eye surgery. [facility] was unable to do surgery due to internal eye bleeding.  He has been reading online and read that it could be cancer or trauma. He is asking for advice on what he should do.

## 2020-08-08 ENCOUNTER — Telehealth: Payer: Self-pay | Admitting: *Deleted

## 2020-08-08 ENCOUNTER — Telehealth: Payer: Self-pay | Admitting: Hematology and Oncology

## 2020-08-08 NOTE — Telephone Encounter (Signed)
-----   Message from Heath Lark, MD sent at 08/08/2020 10:33 AM EDT ----- Regarding: call pt Please call him I spoke with ophthalmologist His visual problems could be from previous radiation but there is nothing we can do now I suggest he follows instructions and treatment recommendation as suggested by Dr. Earnie Larsson

## 2020-08-08 NOTE — Telephone Encounter (Signed)
Dr. Koren Bound ophthalmologist at [facility] about this patient and reviewed the patient past treatment history We believe his visual changes could be related to prior treatment but there is nothing that we can do to reverse the complications from prior treatment I recommend the patient to proceed with treatment recommendation by ophthalmologist as directed

## 2020-08-08 NOTE — Telephone Encounter (Signed)
Called pt & informed of Dr Calton Dach comments.  He expressed understanding.

## 2020-08-20 DIAGNOSIS — E7849 Other hyperlipidemia: Secondary | ICD-10-CM | POA: Diagnosis not present

## 2020-08-20 DIAGNOSIS — Z6831 Body mass index (BMI) 31.0-31.9, adult: Secondary | ICD-10-CM | POA: Diagnosis not present

## 2020-08-20 DIAGNOSIS — N529 Male erectile dysfunction, unspecified: Secondary | ICD-10-CM | POA: Diagnosis not present

## 2020-08-20 DIAGNOSIS — Z23 Encounter for immunization: Secondary | ICD-10-CM | POA: Diagnosis not present

## 2020-08-20 DIAGNOSIS — E291 Testicular hypofunction: Secondary | ICD-10-CM | POA: Diagnosis not present

## 2020-08-29 DIAGNOSIS — H469 Unspecified optic neuritis: Secondary | ICD-10-CM | POA: Diagnosis not present

## 2020-08-29 DIAGNOSIS — Z923 Personal history of irradiation: Secondary | ICD-10-CM | POA: Diagnosis not present

## 2020-08-29 DIAGNOSIS — H26493 Other secondary cataract, bilateral: Secondary | ICD-10-CM | POA: Diagnosis not present

## 2020-08-29 DIAGNOSIS — Z961 Presence of intraocular lens: Secondary | ICD-10-CM | POA: Diagnosis not present

## 2020-10-08 ENCOUNTER — Other Ambulatory Visit (HOSPITAL_COMMUNITY): Payer: Self-pay | Admitting: Physician Assistant

## 2020-10-08 ENCOUNTER — Other Ambulatory Visit: Payer: Self-pay

## 2020-10-08 ENCOUNTER — Ambulatory Visit (HOSPITAL_COMMUNITY)
Admission: RE | Admit: 2020-10-08 | Discharge: 2020-10-08 | Disposition: A | Discharge status: Home / Self Care | Payer: BC Managed Care – PPO | Source: Ambulatory Visit | Attending: Physician Assistant | Admitting: Physician Assistant

## 2020-10-08 DIAGNOSIS — M25561 Pain in right knee: Secondary | ICD-10-CM

## 2020-10-08 DIAGNOSIS — Z6832 Body mass index (BMI) 32.0-32.9, adult: Secondary | ICD-10-CM | POA: Diagnosis not present

## 2020-10-08 DIAGNOSIS — E6609 Other obesity due to excess calories: Secondary | ICD-10-CM | POA: Diagnosis not present

## 2020-10-08 DIAGNOSIS — S8991XA Unspecified injury of right lower leg, initial encounter: Secondary | ICD-10-CM | POA: Diagnosis not present

## 2021-02-04 DIAGNOSIS — F419 Anxiety disorder, unspecified: Secondary | ICD-10-CM | POA: Diagnosis not present

## 2021-02-04 DIAGNOSIS — E6609 Other obesity due to excess calories: Secondary | ICD-10-CM | POA: Diagnosis not present

## 2021-02-04 DIAGNOSIS — E7849 Other hyperlipidemia: Secondary | ICD-10-CM | POA: Diagnosis not present

## 2021-02-04 DIAGNOSIS — Z1331 Encounter for screening for depression: Secondary | ICD-10-CM | POA: Diagnosis not present

## 2021-02-04 DIAGNOSIS — Z6832 Body mass index (BMI) 32.0-32.9, adult: Secondary | ICD-10-CM | POA: Diagnosis not present

## 2021-06-06 DIAGNOSIS — E782 Mixed hyperlipidemia: Secondary | ICD-10-CM | POA: Diagnosis not present

## 2021-06-06 DIAGNOSIS — Z6831 Body mass index (BMI) 31.0-31.9, adult: Secondary | ICD-10-CM | POA: Diagnosis not present

## 2021-06-06 DIAGNOSIS — E6609 Other obesity due to excess calories: Secondary | ICD-10-CM | POA: Diagnosis not present

## 2021-06-06 DIAGNOSIS — I10 Essential (primary) hypertension: Secondary | ICD-10-CM | POA: Diagnosis not present

## 2021-06-06 DIAGNOSIS — C9001 Multiple myeloma in remission: Secondary | ICD-10-CM | POA: Diagnosis not present

## 2021-06-06 DIAGNOSIS — F419 Anxiety disorder, unspecified: Secondary | ICD-10-CM | POA: Diagnosis not present

## 2021-06-12 ENCOUNTER — Inpatient Hospital Stay: Payer: BC Managed Care – PPO | Attending: Hematology and Oncology

## 2021-06-12 DIAGNOSIS — D61818 Other pancytopenia: Secondary | ICD-10-CM | POA: Insufficient documentation

## 2021-06-12 DIAGNOSIS — E538 Deficiency of other specified B group vitamins: Secondary | ICD-10-CM | POA: Insufficient documentation

## 2021-06-12 DIAGNOSIS — R7989 Other specified abnormal findings of blood chemistry: Secondary | ICD-10-CM | POA: Insufficient documentation

## 2021-06-12 DIAGNOSIS — C9001 Multiple myeloma in remission: Secondary | ICD-10-CM | POA: Insufficient documentation

## 2021-06-18 ENCOUNTER — Telehealth: Payer: Self-pay

## 2021-06-18 NOTE — Telephone Encounter (Signed)
Called regarding missed lab appt last week. Lab appt tomorrow at 10 am, rescheduled appt with Dr. Alvy Bimler to 9/15. He is aware of appt times.

## 2021-06-19 ENCOUNTER — Ambulatory Visit: Payer: BC Managed Care – PPO | Admitting: Hematology and Oncology

## 2021-06-19 ENCOUNTER — Other Ambulatory Visit: Payer: Self-pay

## 2021-06-19 ENCOUNTER — Inpatient Hospital Stay: Payer: BC Managed Care – PPO

## 2021-06-19 DIAGNOSIS — D61818 Other pancytopenia: Secondary | ICD-10-CM | POA: Diagnosis not present

## 2021-06-19 DIAGNOSIS — E538 Deficiency of other specified B group vitamins: Secondary | ICD-10-CM | POA: Diagnosis not present

## 2021-06-19 DIAGNOSIS — C9001 Multiple myeloma in remission: Secondary | ICD-10-CM

## 2021-06-19 DIAGNOSIS — R7989 Other specified abnormal findings of blood chemistry: Secondary | ICD-10-CM | POA: Diagnosis not present

## 2021-06-19 LAB — CBC WITH DIFFERENTIAL/PLATELET
Abs Immature Granulocytes: 0.01 10*3/uL (ref 0.00–0.07)
Basophils Absolute: 0 10*3/uL (ref 0.0–0.1)
Basophils Relative: 1 %
Eosinophils Absolute: 0.1 10*3/uL (ref 0.0–0.5)
Eosinophils Relative: 3 %
HCT: 39.2 % (ref 39.0–52.0)
Hemoglobin: 13.8 g/dL (ref 13.0–17.0)
Immature Granulocytes: 0 %
Lymphocytes Relative: 21 %
Lymphs Abs: 0.8 10*3/uL (ref 0.7–4.0)
MCH: 32.2 pg (ref 26.0–34.0)
MCHC: 35.2 g/dL (ref 30.0–36.0)
MCV: 91.6 fL (ref 80.0–100.0)
Monocytes Absolute: 0.4 10*3/uL (ref 0.1–1.0)
Monocytes Relative: 10 %
Neutro Abs: 2.4 10*3/uL (ref 1.7–7.7)
Neutrophils Relative %: 65 %
Platelets: 100 10*3/uL — ABNORMAL LOW (ref 150–400)
RBC: 4.28 MIL/uL (ref 4.22–5.81)
RDW: 12.7 % (ref 11.5–15.5)
WBC: 3.6 10*3/uL — ABNORMAL LOW (ref 4.0–10.5)
nRBC: 0 % (ref 0.0–0.2)

## 2021-06-19 LAB — COMPREHENSIVE METABOLIC PANEL
ALT: 23 U/L (ref 0–44)
AST: 24 U/L (ref 15–41)
Albumin: 3.7 g/dL (ref 3.5–5.0)
Alkaline Phosphatase: 68 U/L (ref 38–126)
Anion gap: 9 (ref 5–15)
BUN: 12 mg/dL (ref 6–20)
CO2: 25 mmol/L (ref 22–32)
Calcium: 9.2 mg/dL (ref 8.9–10.3)
Chloride: 104 mmol/L (ref 98–111)
Creatinine, Ser: 1.28 mg/dL — ABNORMAL HIGH (ref 0.61–1.24)
GFR, Estimated: >60 mL/min (ref >60)
Glucose, Bld: 87 mg/dL (ref 70–99)
Potassium: 3.8 mmol/L (ref 3.5–5.1)
Sodium: 138 mmol/L (ref 135–145)
Total Bilirubin: 0.6 mg/dL (ref 0.3–1.2)
Total Protein: 6.8 g/dL (ref 6.5–8.1)

## 2021-06-19 LAB — VITAMIN B12: Vitamin B-12: 180 pg/mL (ref 180–914)

## 2021-06-20 LAB — KAPPA/LAMBDA LIGHT CHAINS
Kappa free light chain: 27.5 mg/L — ABNORMAL HIGH (ref 3.3–19.4)
Kappa, lambda light chain ratio: 1.12 (ref 0.26–1.65)
Lambda free light chains: 24.5 mg/L (ref 5.7–26.3)

## 2021-06-23 LAB — MULTIPLE MYELOMA PANEL, SERUM
Albumin SerPl Elph-Mcnc: 3.6 g/dL (ref 2.9–4.4)
Albumin/Glob SerPl: 1.4 (ref 0.7–1.7)
Alpha 1: 0.2 g/dL (ref 0.0–0.4)
Alpha2 Glob SerPl Elph-Mcnc: 0.6 g/dL (ref 0.4–1.0)
B-Globulin SerPl Elph-Mcnc: 1.2 g/dL (ref 0.7–1.3)
Gamma Glob SerPl Elph-Mcnc: 0.7 g/dL (ref 0.4–1.8)
Globulin, Total: 2.6 g/dL (ref 2.2–3.9)
IgA: 471 mg/dL — ABNORMAL HIGH (ref 90–386)
IgG (Immunoglobin G), Serum: 713 mg/dL (ref 603–1613)
IgM (Immunoglobulin M), Srm: 83 mg/dL (ref 20–172)
Total Protein ELP: 6.2 g/dL (ref 6.0–8.5)

## 2021-06-26 ENCOUNTER — Other Ambulatory Visit: Payer: Self-pay

## 2021-06-26 ENCOUNTER — Inpatient Hospital Stay (HOSPITAL_BASED_OUTPATIENT_CLINIC_OR_DEPARTMENT_OTHER): Payer: BC Managed Care – PPO | Admitting: Hematology and Oncology

## 2021-06-26 ENCOUNTER — Encounter: Payer: Self-pay | Admitting: Hematology and Oncology

## 2021-06-26 DIAGNOSIS — C9001 Multiple myeloma in remission: Secondary | ICD-10-CM | POA: Diagnosis not present

## 2021-06-26 DIAGNOSIS — E538 Deficiency of other specified B group vitamins: Secondary | ICD-10-CM

## 2021-06-26 DIAGNOSIS — R7989 Other specified abnormal findings of blood chemistry: Secondary | ICD-10-CM

## 2021-06-26 DIAGNOSIS — D61818 Other pancytopenia: Secondary | ICD-10-CM | POA: Diagnosis not present

## 2021-06-26 NOTE — Assessment & Plan Note (Signed)
He has borderline elevated serum creatinine The patient admits he does not drink water We discussed the importance of adequate hydration

## 2021-06-26 NOTE — Assessment & Plan Note (Signed)
He has borderline B12 deficiency causing pancytopenia I recommend oral vitamin B12 supplement daily I plan to recheck again next year 

## 2021-06-26 NOTE — Assessment & Plan Note (Signed)
I reviewed myeloma panel with the patient Currently, he has no signs of disease recurrence His pancytopenia is unlikely to be related He declined returning back to [facility] for post transplant follow-up From the myeloma standpoint, I plan to see him once a year with history, physical examination and blood work The patient is educated to watch out for signs and symptoms of cancer recurrence 

## 2021-06-26 NOTE — Progress Notes (Signed)
[place] OFFICE PROGRESS NOTE  Patient Care Team: Sharilyn Sites, MD as PCP - General (Family Medicine) Jeanann Lewandowsky, MD as PCP - Hematology/Oncology (Hematology and Oncology) Melissa Montane, MD as Attending Physician (Otolaryngology) Eppie Gibson, MD (Radiation Oncology) Elayne Snare, MD as Attending Physician (Endocrinology) Carol Ada, MD as Attending Physician (Gastroenterology) Heath Lark, MD as Consulting Physician (Hematology and Oncology)  ASSESSMENT & PLAN:  Multiple myeloma in remission [facility]) I reviewed myeloma panel with the patient Currently, he has no signs of disease recurrence His pancytopenia is unlikely to be related He declined returning back to [facility] for post transplant follow-up From the myeloma standpoint, I plan to see him once a year with history, physical examination and blood work The patient is educated to watch out for signs and symptoms of cancer recurrence  B12 deficiency He has borderline B12 deficiency causing pancytopenia I recommend oral vitamin B12 supplement daily I plan to recheck again next year  Elevated serum creatinine He has borderline elevated serum creatinine The patient admits he does not drink water We discussed the importance of adequate hydration  Orders Placed This Encounter  Procedures   CBC with Differential/Platelet    Standing Status:   Standing    Number of Occurrences:   22    Standing Expiration Date:   06/26/2022   Comprehensive metabolic panel    Standing Status:   Standing    Number of Occurrences:   33    Standing Expiration Date:   06/26/2022   Vitamin B12    Standing Status:   Standing    Number of Occurrences:   1    Standing Expiration Date:   06/26/2022   Kappa/lambda light chains    Standing Status:   Standing    Number of Occurrences:   22    Standing Expiration Date:   06/26/2022   Multiple Myeloma Panel (SPEP&IFE w/QIG)    Standing Status:   Standing    Number of Occurrences:    22    Standing Expiration Date:   06/26/2022    All questions were answered. The patient knows to call the clinic with any problems, questions or concerns. The total time spent in the appointment was 20 minutes encounter with patients including review of chart and various tests results, discussions about plan of care and coordination of care plan   Heath Lark, MD 06/26/2021 1:02 PM  INTERVAL HISTORY: Please see below for problem oriented charting. he returns for follow-up on history of multiple myeloma status post bone marrow transplant He is here accompanied by his significant other, Coralyn Mark He is doing well Denies recent infection No new bone pain He has not been taking vitamin B12 supplement at all  REVIEW OF SYSTEMS:   Constitutional: Denies fevers, chills or abnormal weight loss Eyes: Denies blurriness of vision Ears, nose, mouth, throat, and face: Denies mucositis or sore throat Respiratory: Denies cough, dyspnea or wheezes Cardiovascular: Denies palpitation, chest discomfort or lower extremity swelling Gastrointestinal:  Denies nausea, heartburn or change in bowel habits Skin: Denies abnormal skin rashes Lymphatics: Denies new lymphadenopathy or easy bruising Neurological:Denies numbness, tingling or new weaknesses Behavioral/Psych: Mood is stable, no new changes  All other systems were reviewed with the patient and are negative.  I have reviewed the past medical history, past surgical history, social history and family history with the patient and they are unchanged from previous note.  ALLERGIES:  has No Known Allergies.  MEDICATIONS:  Current Outpatient Medications  Medication Sig  Dispense Refill   escitalopram (LEXAPRO) 20 MG tablet Take 1 tablet (20 mg total) by mouth daily. 90 tablet 3   ibuprofen (ADVIL,MOTRIN) 200 MG tablet Take 200 mg by mouth every 6 (six) hours as needed.     lansoprazole (PREVACID) 15 MG capsule Take 15 mg by mouth daily.     loratadine  (CLARITIN) 10 MG tablet Take 10 mg by mouth daily.     LORazepam (ATIVAN) 1 MG tablet Take 1 tablet (1 mg total) by mouth 2 (two) times daily as needed for anxiety. 20 tablet 0   No current facility-administered medications for this visit.    SUMMARY OF ONCOLOGIC HISTORY: Oncology History Overview Note  IgG Kappa light chain disease, Durie-Salmon stage III   Multiple myeloma in remission ([place])  01/19/2012 Imaging   The patient has CT scan of the head for evaluation of severe headaches which show abnormalities in the sinus   01/22/2012 Bone Marrow Biopsy   Bone marrow biopsy show only 2% plasma cell   01/22/2012 Imaging   CT scan of the sinus showed Ethmoid mucocele with some expansion of the air cells and soft tissue extending into the upper nasal cavity.    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  No additional areas of abnormalities elsewhere.     05/20/2012 Imaging   Repeat CT scan of the sinuses show complete response to treatment   03/03/2013 Imaging   CT scan of the chest showed fracture of the left seventh rib represents a pathologic fracture.  There is a destructive soft tissue mass at the site of the fracture worrisome for malignancy.    03/10/2013 Bone Marrow Biopsy   Repeat bone marrow biopsy again initial 2% plasma cell   03/17/2013 Imaging   Repeat PET scan showed numerous lytic myelomatous bone lesions involving the spine, left ribs and left iliac bone. There were also two pancreatic lesions and possibly three liver lesions suspicious for metastatic disease.     03/21/2013 Procedure   Biopsy of the rib lesion came back plasmacytoma   03/24/2013 Procedure   Fine-needle aspirate and biopsy of the pancreatic mass confirmed plasma cell    06/09/2013 - 08/09/2013 Chemotherapy   The patient completed 4 months of  induction chemotherapy with Revlimid, Velcade and dexamethasone along with Zometa   08/21/2013 Imaging   Repeat PET scan at [facility] show persistent lytic lesions but there were no abnormalities in the pancreas or liver   09/28/2013 Bone Marrow Transplant   The patient underwent autologous stem cell rescue after conditioning therapy with melphalan   02/26/2015 Miscellaneous   Established oncology care at  [facility]   02/26/2015 Miscellaneous   Non-complicance with appointments: No show on: 03/29/2015, 04/03/2015, 04/25/2015.     PHYSICAL EXAMINATION: ECOG PERFORMANCE STATUS: 0 - Asymptomatic  Vitals:   06/26/21 1136  BP: 133/89  Pulse: 66  Resp: 20  Temp: 98.3 F (36.8 C)  SpO2: 99%   Filed Weights   06/26/21 1136  Weight: 260 lb (117.9 kg)    GENERAL:alert, no distress and comfortable NEURO: alert & oriented x 3 with fluent speech, no focal motor/sensory deficits  LABORATORY DATA:  I have reviewed the data as listed    Component Value Date/Time   NA 138 06/19/2021 1000   NA  138 06/03/2017 1336   K 3.8 06/19/2021 1000   K 4.0 06/03/2017 1336   CL 104 06/19/2021 1000   CL 105 03/30/2013 1340   CO2 25 06/19/2021 1000   CO2 27 06/03/2017 1336   GLUCOSE 87 06/19/2021 1000   GLUCOSE 107 06/03/2017 1336   GLUCOSE 120 (H) 03/30/2013 1340   BUN 12 06/19/2021 1000   BUN 10.5 06/03/2017 1336   CREATININE 1.28 (H) 06/19/2021 1000   CREATININE 1.2 06/03/2017 1336   CALCIUM 9.2 06/19/2021 1000   CALCIUM 9.5 06/03/2017 1336   PROT 6.8 06/19/2021 1000   PROT 7.1 06/03/2017 1336   PROT 6.6 06/03/2017 1336   ALBUMIN 3.7 06/19/2021 1000   ALBUMIN 3.8 06/03/2017 1336   AST 24 06/19/2021 1000   AST 32 06/03/2017 1336   ALT 23 06/19/2021 1000   ALT 38 06/03/2017 1336   ALKPHOS 68 06/19/2021 1000   ALKPHOS 80 06/03/2017 1336   BILITOT 0.6 06/19/2021 1000   BILITOT 0.75 06/03/2017 1336   GFRNONAA >60 06/19/2021 1000   GFRAA >60 06/18/2020 1318    No results  found for: SPEP, UPEP  Lab Results  Component Value Date   WBC 3.6 (L) 06/19/2021   NEUTROABS 2.4 06/19/2021   HGB 13.8 06/19/2021   HCT 39.2 06/19/2021   MCV 91.6 06/19/2021   PLT 100 (L) 06/19/2021      Chemistry      Component Value Date/Time   NA 138 06/19/2021 1000   NA 138 06/03/2017 1336   K 3.8 06/19/2021 1000   K 4.0 06/03/2017 1336   CL 104 06/19/2021 1000   CL 105 03/30/2013 1340   CO2 25 06/19/2021 1000   CO2 27 06/03/2017 1336   BUN 12 06/19/2021 1000   BUN 10.5 06/03/2017 1336   CREATININE 1.28 (H) 06/19/2021 1000   CREATININE 1.2 06/03/2017 1336      Component Value Date/Time   CALCIUM 9.2 06/19/2021 1000   CALCIUM 9.5 06/03/2017 1336   ALKPHOS 68 06/19/2021 1000   ALKPHOS 80 06/03/2017 1336   AST 24 06/19/2021 1000   AST 32 06/03/2017 1336   ALT 23 06/19/2021 1000   ALT 38 06/03/2017 1336   BILITOT 0.6 06/19/2021 1000   BILITOT 0.75 06/03/2017 1336

## 2021-07-21 ENCOUNTER — Other Ambulatory Visit: Payer: Self-pay

## 2021-07-21 ENCOUNTER — Emergency Department (HOSPITAL_COMMUNITY)
Admission: EM | Admit: 2021-07-21 | Discharge: 2021-07-21 | Disposition: A | Discharge status: Home / Self Care | Payer: BC Managed Care – PPO | Attending: Emergency Medicine | Admitting: Emergency Medicine

## 2021-07-21 ENCOUNTER — Encounter (HOSPITAL_COMMUNITY): Payer: Self-pay

## 2021-07-21 DIAGNOSIS — Z5321 Procedure and treatment not carried out due to patient leaving prior to being seen by health care provider: Secondary | ICD-10-CM | POA: Insufficient documentation

## 2021-07-21 DIAGNOSIS — H539 Unspecified visual disturbance: Secondary | ICD-10-CM | POA: Diagnosis not present

## 2021-07-21 NOTE — ED Notes (Signed)
Pt left ED with IV in arm. Pt found in parking lot. Pt visibly upset and cussing staff. Asked pt if he still had his IV and told needed to remove before he left facility. Pt states "Yes, you don't have to fucking do anything before I leave. People would die of a stroke before y'all do a damn thing up here." Informed I would still need to remove his IV. Pt still argumentative with RN, agreed to RN to remove IV after several attempts. Pt ambulatory in parking lot, left in POV.

## 2021-07-21 NOTE — ED Triage Notes (Signed)
Pt presents to ED with complaints of sudden loss of vision in left eye started yesterday at 1500. Pt states everything in his left eye was purple, he states he went to bed woke up and now he can't see "I don't know how to explain it but I just can't see." Pt rude and disrespectful to staff, states "I am not staying long, I already told Dr Hilma Favors that as I flipped his computer upside down in the office. They ain't never found anything wrong before so I don't plan on staying long this time. They admitted me 3 days last time and didn't find anything." Informed pt he had a right to receive medical care.

## 2021-07-21 NOTE — ED Notes (Signed)
Dr Melina Copa informed of pt arrival and complaints.

## 2021-07-23 ENCOUNTER — Telehealth: Payer: Self-pay | Admitting: *Deleted

## 2021-07-23 NOTE — Telephone Encounter (Signed)
Pt sated Sunday he lost vision in his left eye for about 5 hours while watching Sunday football. He said it came back but was blurry and it hurt. I advised to go to the ED, he said,no, last time he went he stayed for 3 days and they didn't do anything. Wants to know if Dr.Gorsuch can get him in to see an ophthalmologist. I advised pt that provider is out of office and someone will call this week with recommendations. Pt verbalized understanding

## 2021-07-24 ENCOUNTER — Telehealth: Payer: Self-pay

## 2021-07-24 DIAGNOSIS — C9020 Extramedullary plasmacytoma not having achieved remission: Secondary | ICD-10-CM | POA: Diagnosis not present

## 2021-07-24 DIAGNOSIS — H4720 Unspecified optic atrophy: Secondary | ICD-10-CM | POA: Diagnosis not present

## 2021-07-24 DIAGNOSIS — H348122 Central retinal vein occlusion, left eye, stable: Secondary | ICD-10-CM | POA: Diagnosis not present

## 2021-07-24 DIAGNOSIS — H3582 Retinal ischemia: Secondary | ICD-10-CM | POA: Diagnosis not present

## 2021-07-24 NOTE — Telephone Encounter (Signed)
Called and left a message. Per Dr. Alvy Bimler, he is in remission and off treatment for a long time. His symptoms are considered a mini stroke. Dr. Alvy Bimler is unable to help. If he does not want to go to ER he needs to see PCP for the loss of vision on Sunday for 5 hours.  Ask him to call the office back if needed.

## 2021-07-29 DIAGNOSIS — R7309 Other abnormal glucose: Secondary | ICD-10-CM | POA: Diagnosis not present

## 2021-07-29 DIAGNOSIS — H3582 Retinal ischemia: Secondary | ICD-10-CM | POA: Diagnosis not present

## 2021-08-01 DIAGNOSIS — Z9221 Personal history of antineoplastic chemotherapy: Secondary | ICD-10-CM | POA: Diagnosis not present

## 2021-08-01 DIAGNOSIS — F32A Depression, unspecified: Secondary | ICD-10-CM | POA: Diagnosis not present

## 2021-08-01 DIAGNOSIS — E538 Deficiency of other specified B group vitamins: Secondary | ICD-10-CM | POA: Diagnosis not present

## 2021-08-01 DIAGNOSIS — C9001 Multiple myeloma in remission: Secondary | ICD-10-CM | POA: Diagnosis not present

## 2021-08-01 DIAGNOSIS — H4720 Unspecified optic atrophy: Secondary | ICD-10-CM | POA: Diagnosis not present

## 2021-08-01 DIAGNOSIS — K219 Gastro-esophageal reflux disease without esophagitis: Secondary | ICD-10-CM | POA: Diagnosis not present

## 2021-08-01 DIAGNOSIS — I10 Essential (primary) hypertension: Secondary | ICD-10-CM | POA: Diagnosis not present

## 2021-08-01 DIAGNOSIS — E785 Hyperlipidemia, unspecified: Secondary | ICD-10-CM | POA: Diagnosis not present

## 2021-08-01 DIAGNOSIS — I776 Arteritis, unspecified: Secondary | ICD-10-CM | POA: Diagnosis not present

## 2021-08-01 DIAGNOSIS — F419 Anxiety disorder, unspecified: Secondary | ICD-10-CM | POA: Diagnosis not present

## 2021-08-01 DIAGNOSIS — Z961 Presence of intraocular lens: Secondary | ICD-10-CM | POA: Diagnosis not present

## 2021-08-01 DIAGNOSIS — C9021 Extramedullary plasmacytoma in remission: Secondary | ICD-10-CM | POA: Diagnosis not present

## 2021-08-01 DIAGNOSIS — Z923 Personal history of irradiation: Secondary | ICD-10-CM | POA: Diagnosis not present

## 2021-08-01 DIAGNOSIS — H547 Unspecified visual loss: Secondary | ICD-10-CM | POA: Diagnosis not present

## 2021-08-01 DIAGNOSIS — H3412 Central retinal artery occlusion, left eye: Secondary | ICD-10-CM | POA: Diagnosis not present

## 2021-08-01 DIAGNOSIS — Z20822 Contact with and (suspected) exposure to covid-19: Secondary | ICD-10-CM | POA: Diagnosis not present

## 2021-08-01 DIAGNOSIS — Z9484 Stem cells transplant status: Secondary | ICD-10-CM | POA: Diagnosis not present

## 2021-08-01 DIAGNOSIS — H53129 Transient visual loss, unspecified eye: Secondary | ICD-10-CM | POA: Diagnosis not present

## 2021-08-02 DIAGNOSIS — H547 Unspecified visual loss: Secondary | ICD-10-CM | POA: Diagnosis not present

## 2021-08-02 DIAGNOSIS — H3412 Central retinal artery occlusion, left eye: Secondary | ICD-10-CM | POA: Diagnosis not present

## 2021-08-02 DIAGNOSIS — C9000 Multiple myeloma not having achieved remission: Secondary | ICD-10-CM | POA: Diagnosis not present

## 2021-08-08 DIAGNOSIS — H26491 Other secondary cataract, right eye: Secondary | ICD-10-CM | POA: Diagnosis not present

## 2021-08-19 DIAGNOSIS — A491 Streptococcal infection, unspecified site: Secondary | ICD-10-CM | POA: Diagnosis not present

## 2021-08-19 DIAGNOSIS — H4052X2 Glaucoma secondary to other eye disorders, left eye, moderate stage: Secondary | ICD-10-CM | POA: Diagnosis not present

## 2021-08-19 DIAGNOSIS — Z961 Presence of intraocular lens: Secondary | ICD-10-CM | POA: Diagnosis not present

## 2021-08-19 DIAGNOSIS — H4052X0 Glaucoma secondary to other eye disorders, left eye, stage unspecified: Secondary | ICD-10-CM | POA: Diagnosis not present

## 2021-08-19 DIAGNOSIS — H3412 Central retinal artery occlusion, left eye: Secondary | ICD-10-CM | POA: Diagnosis not present

## 2021-08-19 DIAGNOSIS — Z1621 Resistance to vancomycin: Secondary | ICD-10-CM | POA: Diagnosis not present

## 2021-08-26 DIAGNOSIS — H3582 Retinal ischemia: Secondary | ICD-10-CM | POA: Diagnosis not present

## 2021-08-26 DIAGNOSIS — H4052X2 Glaucoma secondary to other eye disorders, left eye, moderate stage: Secondary | ICD-10-CM | POA: Diagnosis not present

## 2021-08-26 DIAGNOSIS — H3412 Central retinal artery occlusion, left eye: Secondary | ICD-10-CM | POA: Diagnosis not present

## 2021-08-26 DIAGNOSIS — I10 Essential (primary) hypertension: Secondary | ICD-10-CM | POA: Diagnosis not present

## 2021-08-26 DIAGNOSIS — H1789 Other corneal scars and opacities: Secondary | ICD-10-CM | POA: Diagnosis not present

## 2021-09-02 DIAGNOSIS — H3582 Retinal ischemia: Secondary | ICD-10-CM | POA: Diagnosis not present

## 2021-09-02 DIAGNOSIS — H3412 Central retinal artery occlusion, left eye: Secondary | ICD-10-CM | POA: Diagnosis not present

## 2021-09-02 DIAGNOSIS — H4052X2 Glaucoma secondary to other eye disorders, left eye, moderate stage: Secondary | ICD-10-CM | POA: Diagnosis not present

## 2021-09-02 DIAGNOSIS — Z961 Presence of intraocular lens: Secondary | ICD-10-CM | POA: Diagnosis not present

## 2021-09-16 DIAGNOSIS — H3412 Central retinal artery occlusion, left eye: Secondary | ICD-10-CM | POA: Diagnosis not present

## 2021-09-16 DIAGNOSIS — H3500 Unspecified background retinopathy: Secondary | ICD-10-CM | POA: Diagnosis not present

## 2021-09-16 DIAGNOSIS — C9000 Multiple myeloma not having achieved remission: Secondary | ICD-10-CM | POA: Diagnosis not present

## 2021-09-16 DIAGNOSIS — H538 Other visual disturbances: Secondary | ICD-10-CM | POA: Diagnosis not present

## 2021-10-06 ENCOUNTER — Emergency Department (HOSPITAL_COMMUNITY): Payer: BC Managed Care – PPO

## 2021-10-06 ENCOUNTER — Emergency Department (HOSPITAL_COMMUNITY)
Admission: EM | Admit: 2021-10-06 | Discharge: 2021-10-07 | Disposition: A | Discharge status: Home / Self Care | Payer: BC Managed Care – PPO | Attending: Emergency Medicine | Admitting: Emergency Medicine

## 2021-10-06 DIAGNOSIS — R202 Paresthesia of skin: Secondary | ICD-10-CM | POA: Insufficient documentation

## 2021-10-06 DIAGNOSIS — R531 Weakness: Secondary | ICD-10-CM | POA: Insufficient documentation

## 2021-10-06 DIAGNOSIS — Y900 Blood alcohol level of less than 20 mg/100 ml: Secondary | ICD-10-CM | POA: Insufficient documentation

## 2021-10-06 DIAGNOSIS — R0689 Other abnormalities of breathing: Secondary | ICD-10-CM | POA: Diagnosis not present

## 2021-10-06 DIAGNOSIS — R1110 Vomiting, unspecified: Secondary | ICD-10-CM | POA: Diagnosis not present

## 2021-10-06 DIAGNOSIS — Z96643 Presence of artificial hip joint, bilateral: Secondary | ICD-10-CM | POA: Diagnosis not present

## 2021-10-06 DIAGNOSIS — I10 Essential (primary) hypertension: Secondary | ICD-10-CM | POA: Insufficient documentation

## 2021-10-06 DIAGNOSIS — R110 Nausea: Secondary | ICD-10-CM | POA: Diagnosis not present

## 2021-10-06 DIAGNOSIS — Z20822 Contact with and (suspected) exposure to covid-19: Secondary | ICD-10-CM | POA: Diagnosis not present

## 2021-10-06 DIAGNOSIS — R42 Dizziness and giddiness: Secondary | ICD-10-CM | POA: Diagnosis not present

## 2021-10-06 DIAGNOSIS — R4182 Altered mental status, unspecified: Secondary | ICD-10-CM | POA: Insufficient documentation

## 2021-10-06 LAB — COMPREHENSIVE METABOLIC PANEL
ALT: 21 U/L (ref 0–44)
AST: 22 U/L (ref 15–41)
Albumin: 3.9 g/dL (ref 3.5–5.0)
Alkaline Phosphatase: 57 U/L (ref 38–126)
Anion gap: 6 (ref 5–15)
BUN: 21 mg/dL — ABNORMAL HIGH (ref 6–20)
CO2: 25 mmol/L (ref 22–32)
Calcium: 8.8 mg/dL — ABNORMAL LOW (ref 8.9–10.3)
Chloride: 102 mmol/L (ref 98–111)
Creatinine, Ser: 1.51 mg/dL — ABNORMAL HIGH (ref 0.61–1.24)
GFR, Estimated: 53 mL/min — ABNORMAL LOW (ref >60)
Glucose, Bld: 128 mg/dL — ABNORMAL HIGH (ref 70–99)
Potassium: 4 mmol/L (ref 3.5–5.1)
Sodium: 133 mmol/L — ABNORMAL LOW (ref 135–145)
Total Bilirubin: 0.3 mg/dL (ref 0.3–1.2)
Total Protein: 6.7 g/dL (ref 6.5–8.1)

## 2021-10-06 LAB — CBC
HCT: 33 % — ABNORMAL LOW (ref 39.0–52.0)
Hemoglobin: 12 g/dL — ABNORMAL LOW (ref 13.0–17.0)
MCH: 33.6 pg (ref 26.0–34.0)
MCHC: 36.4 g/dL — ABNORMAL HIGH (ref 30.0–36.0)
MCV: 92.4 fL (ref 80.0–100.0)
Platelets: 129 10*3/uL — ABNORMAL LOW (ref 150–400)
RBC: 3.57 MIL/uL — ABNORMAL LOW (ref 4.22–5.81)
RDW: 12.6 % (ref 11.5–15.5)
WBC: 4.6 10*3/uL (ref 4.0–10.5)
nRBC: 0 % (ref 0.0–0.2)

## 2021-10-06 LAB — CBG MONITORING, ED: Glucose-Capillary: 102 mg/dL — ABNORMAL HIGH (ref 70–99)

## 2021-10-06 LAB — ETHANOL: Alcohol, Ethyl (B): <10 mg/dL (ref <10)

## 2021-10-06 NOTE — ED Notes (Signed)
BIB [facility] from home for cc of Nausea/vomiting but did eat 4lb of pork. Also c/o htn. Hx of the same.

## 2021-10-06 NOTE — ED Provider Notes (Signed)
[facility] EMERGENCY DEPARTMENT Provider Note   CSN: [id number] Arrival date & time: 10/06/21  1929     History Chief Complaint  Patient presents with   Nausea   Altered Mental Status    Austin Griffith is a 60 y.o. male.  Patient presents to the emergency department for evaluation of cute onset dizziness, nausea and confusion.  Symptoms began suddenly while he was eating tonight.  Significant other reports that he suddenly began to complain of dizziness and weakness.  He then became nauseated.  At that point he became acutely confused.  Patient does not remember any of this occurring.  Significant other reports that he had difficulty with speech during this period of time.  He was complaining of tingling and weakness of his lower extremities.  Symptoms have slowly improved and he feels close to baseline now.      Past Medical History:  Diagnosis Date   Acid reflux    Anxiety 07/05/2013   AVN (avascular necrosis of bone) ([place])    bilateral hips; s/p hip replacement in 1991 and 2009   Blurred vision    Present for "many years"   Bone metastases ([place]) 03/31/13    MR Abdomen -Lower Thoracic and Upper Lumbar spine   Depression 11/22/2013   Epistaxis 02/08/2012   Extramedullary plasmacytoma in relapse ([place]) 04/10/2013   Fatigue    Fatigue 05/25/12   "Mild"   Headache(784.0) 02/08/2012   Right Sided Headache Behind Right Eye   History of radiation therapy 02/29/12-04/08/12   right  ethmoid sinus in 50.4Gy in 20 fxs   Hyperlipidemia    diet control    Hypertension    Knee pain 10/18/2013   Liver lesion 03/31/13   MR Abdomen   Medically noncompliant 09/29/2015   Multiple myeloma in remission ([place]) 11/22/2013   Multiple myeloma, in relapse 07/05/2013   Nausea alone 09/12/2013   Neutropenic fever ([place]) 09/18/2013   On antineoplastic chemotherapy started 04/08/13   Revlimid/ Velcade/ Dex   Pancreatic lesion 03/31/13    MR Abdomen - Ucinate Process   Plasmacytoma ([place])     Right  Ethmoid Sinus; bone marrow biopsy on 03/10/13 showed normal myeloma FISH panel and cytogenetics.    S/P radiation therapy 03/28/2013   Left posterior 7th Rib / 8Gy in 1 fraction   Shingles Feb 24, 2015   Thrombocytopenia ([place]) 05/25/12    Patient Active Problem List   Diagnosis Date Noted   Blurred vision 06/21/2020   Vitamin D deficiency 06/22/2019   B12 deficiency 06/22/2019   Paresthesia of left arm and leg 11/21/2018   TIA (transient ischemic attack) 11/20/2018   Elevated serum creatinine 06/10/2018   Bilateral leg cramps 06/10/2018   Pancytopenia, acquired ([place]) 06/10/2017   Dehydration 01/26/2017   Hypotension 01/26/2017   Thrombocytopenia ([place]) 01/26/2017   Leukopenia 01/26/2017   Acute renal insufficiency 01/26/2017   Acute bronchitis 01/26/2017   Syncope 01/26/2017   Orthostatic hypotension 01/26/2017   Epistaxis 01/26/2017   History of ETOH abuse 01/26/2017   Hematuria, gross 10/30/2016   History of skin cancer 10/30/2016   Medically noncompliant 09/29/2015   Depression 11/22/2013   S/P bone marrow transplant ([place]) 09/29/2013   Multiple myeloma in remission ([place]) 07/05/2013   Anxiety 07/05/2013   Adrenal insufficiency ([place]) 07/22/2012   Hypertension     Past Surgical History:  Procedure Laterality Date   Bilateral hip replacement  1991; and 2009   due to bilateral hip AVN's. / reports left hip was  not replaced   EUS N/A 03/24/2013   Procedure: UPPER ENDOSCOPIC ULTRASOUND (EUS) LINEAR;  Surgeon: Beryle Beams, MD;  Location: [facility] ENDOSCOPY;  Service: Endoscopy;  Laterality: N/A;   FINE NEEDLE ASPIRATION N/A 03/24/2013   Procedure: FINE NEEDLE ASPIRATION (FNA) LINEAR;  Surgeon: Beryle Beams, MD;  Location: [facility] ENDOSCOPY;  Service: Endoscopy;  Laterality: N/A;   MASS BIOPSY  01/26/12   Right Ethmoid Mass - Plasma Cell Neoplasm       Family History  Adopted: Yes    Social History   Tobacco Use   Smoking status: Never   Smokeless tobacco: Never  Vaping Use    Vaping Use: Never used  Substance Use Topics   Alcohol use: Yes    Comment: Hx of Alcohol Abuse.09/18/13 denies but says drinks etoh   Drug use: No    Home Medications Prior to Admission medications   Medication Sig Start Date End Date Taking? Authorizing Provider  escitalopram (LEXAPRO) 20 MG tablet Take 1 tablet (20 mg total) by mouth daily. 11/22/13   Heath Lark, MD  ibuprofen (ADVIL,MOTRIN) 200 MG tablet Take 200 mg by mouth every 6 (six) hours as needed.    [provider]  lansoprazole (PREVACID) 15 MG capsule Take 15 mg by mouth daily.    [provider]  loratadine (CLARITIN) 10 MG tablet Take 10 mg by mouth daily.    [provider]  LORazepam (ATIVAN) 1 MG tablet Take 1 tablet (1 mg total) by mouth 2 (two) times daily as needed for anxiety. 09/17/16   Evalee Jefferson, PA-C    Allergies    Patient has no known allergies.  Review of Systems   Review of Systems  Gastrointestinal:  Positive for nausea.  Neurological:  Positive for dizziness and weakness.  Psychiatric/Behavioral:  Positive for confusion.   All other systems reviewed and are negative.  Physical Exam Updated Vital Signs BP 113/69    Pulse (!) 59    Temp 98.5 F (36.9 C) (Oral)    Resp 19    Ht $R'6\' 5"'AO$  (1.956 m)    Wt 116.6 kg    SpO2 94%    BMI 30.48 kg/m   Physical Exam Vitals and nursing note reviewed.  Constitutional:      General: He is not in acute distress.    Appearance: Normal appearance. He is well-developed.  HENT:     Head: Normocephalic and atraumatic.     Right Ear: Hearing normal.     Left Ear: Hearing normal.     Nose: Nose normal.  Eyes:     Conjunctiva/sclera: Conjunctivae normal.     Pupils: Pupils are equal, round, and reactive to light.  Cardiovascular:     Rate and Rhythm: Regular rhythm.     Heart sounds: S1 normal and S2 normal. No murmur heard.   No friction rub. No gallop.  Pulmonary:     Effort: Pulmonary effort is normal. No respiratory distress.      Breath sounds: Normal breath sounds.  Chest:     Chest wall: No tenderness.  Abdominal:     General: Bowel sounds are normal.     Palpations: Abdomen is soft.     Tenderness: There is no abdominal tenderness. There is no guarding or rebound. Negative signs include Murphy's sign and McBurney's sign.     Hernia: No hernia is present.  Musculoskeletal:        General: Normal range of motion.     Cervical back:  Normal range of motion and neck supple.  Skin:    General: Skin is warm and dry.     Findings: No rash.  Neurological:     Mental Status: He is alert and oriented to person, place, and time.     GCS: GCS eye subscore is 4. GCS verbal subscore is 5. GCS motor subscore is 6.     Cranial Nerves: No cranial nerve deficit.     Sensory: No sensory deficit.     Coordination: Coordination normal.  Psychiatric:        Speech: Speech normal.        Behavior: Behavior normal.        Thought Content: Thought content normal.    ED Results / Procedures / Treatments   Labs (all labs ordered are listed, but only abnormal results are displayed) Labs Reviewed  COMPREHENSIVE METABOLIC PANEL - Abnormal; Notable for the following components:      Result Value   Sodium 133 (*)    Glucose, Bld 128 (*)    BUN 21 (*)    Creatinine, Ser 1.51 (*)    Calcium 8.8 (*)    GFR, Estimated 53 (*)    All other components within normal limits  CBC - Abnormal; Notable for the following components:   RBC 3.57 (*)    Hemoglobin 12.0 (*)    HCT 33.0 (*)    MCHC 36.4 (*)    Platelets 129 (*)    All other components within normal limits  RAPID URINE DRUG SCREEN, HOSP PERFORMED - Abnormal; Notable for the following components:   Benzodiazepines POSITIVE (*)    Tetrahydrocannabinol POSITIVE (*)    All other components within normal limits  URINALYSIS, ROUTINE W REFLEX MICROSCOPIC - Abnormal; Notable for the following components:   Specific Gravity, Urine >1.030 (*)    Hgb urine dipstick LARGE (*)     Protein, ur >300 (*)    All other components within normal limits  URINALYSIS, MICROSCOPIC (REFLEX) - Abnormal; Notable for the following components:   Bacteria, UA MANY (*)    All other components within normal limits  CBG MONITORING, ED - Abnormal; Notable for the following components:   Glucose-Capillary 102 (*)    All other components within normal limits  RESP PANEL BY RT-PCR (FLU A&B, COVID) ARPGX2  ETHANOL  PROTIME-INR  APTT    EKG None  Radiology CT HEAD WO CONTRAST  Result Date: 10/07/2021 CLINICAL DATA:  Altered mental status. EXAM: CT HEAD WITHOUT CONTRAST TECHNIQUE: Contiguous axial images were obtained from the base of the skull through the vertex without intravenous contrast. COMPARISON:  November 20, 2018 FINDINGS: Brain: There is mild cerebral atrophy with widening of the extra-axial spaces and ventricular dilatation. There are areas of decreased attenuation within the white matter tracts of the supratentorial brain, consistent with microvascular disease changes. Vascular: No hyperdense vessel or unexpected calcification. Skull: Normal. Negative for fracture or focal lesion. Sinuses/Orbits: A chronic deformity is seen involving the medial wall of the right orbit. Mild right ethmoid sinus and right sphenoid sinus mucosal thickening is noted. Other: None. IMPRESSION: 1. Generalized cerebral atrophy. 2. No acute intracranial abnormality. 3. Mild right ethmoid sinus and right sphenoid sinus disease. Electronically Signed   By: Virgina Norfolk M.D.   On: 10/07/2021 00:12    Procedures Procedures   Medications Ordered in ED Medications - No data to display  ED Course  I have reviewed the triage vital signs and the nursing notes.  Pertinent  labs & imaging results that were available during my care of the patient were reviewed by me and considered in my medical decision making (see chart for details).    MDM Rules/Calculators/A&P                         Patient presents  to the emergency department for evaluation of an acute episode where he became dizzy, confused.  He was very altered, according to his significant other.  He seemed to be having some trouble talking.  He thinks he might of taken an extra blood pressure pill, was concerned about low blood pressure but when EMS responded his blood pressure was high.  Patient is back to his normal baseline now.  He has a normal neurologic exam.  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  Neurologic evaluation was therefore performed.  Discussed with Dr. Lajuan Lines. No further neurologic work-up has been recommended.  Suspect that this was transient hypotension, possibly related to his acute nausea.  Will discharge, follow-up with primary care.     Final Clinical Impression(s) / ED Diagnoses Final diagnoses:  Nausea  Dizziness    Rx / DC Orders ED Discharge Orders     None        Rudy Luhmann, Gwenyth Allegra, MD 10/07/21 [phone]

## 2021-10-06 NOTE — ED Triage Notes (Signed)
On assessment pt reports intermittent confusion, visitor reports pt is not acting himself, A&OX4, But slow to respond.

## 2021-10-07 DIAGNOSIS — R4182 Altered mental status, unspecified: Secondary | ICD-10-CM | POA: Diagnosis not present

## 2021-10-07 LAB — URINALYSIS, ROUTINE W REFLEX MICROSCOPIC
Bilirubin Urine: NEGATIVE
Glucose, UA: NEGATIVE mg/dL
Ketones, ur: NEGATIVE mg/dL
Leukocytes,Ua: NEGATIVE
Nitrite: NEGATIVE
Protein, ur: >300 mg/dL — AB
Specific Gravity, Urine: >1.03 — ABNORMAL HIGH (ref 1.005–1.030)
pH: 6 (ref 5.0–8.0)

## 2021-10-07 LAB — URINALYSIS, MICROSCOPIC (REFLEX)

## 2021-10-07 LAB — RAPID URINE DRUG SCREEN, HOSP PERFORMED
Amphetamines: NOT DETECTED
Barbiturates: NOT DETECTED
Benzodiazepines: POSITIVE — AB
Cocaine: NOT DETECTED
Opiates: NOT DETECTED
Tetrahydrocannabinol: POSITIVE — AB

## 2021-10-07 LAB — RESP PANEL BY RT-PCR (FLU A&B, COVID) ARPGX2
Influenza A by PCR: NEGATIVE
Influenza B by PCR: NEGATIVE
SARS Coronavirus 2 by RT PCR: NEGATIVE

## 2021-10-07 LAB — PROTIME-INR
INR: 1 (ref 0.8–1.2)
Prothrombin Time: 13.2 seconds (ref 11.4–15.2)

## 2021-10-07 LAB — APTT: aPTT: 29 seconds (ref 24–36)

## 2021-10-07 NOTE — Consult Note (Signed)
[facility] TeleNeurology Consult Services  Stat Consult  [name], Austin Griffith Date of Birth:   November 12, 1960 Identification Number:   MRN - [id number] Date of Service:   10/07/2021 02:20:41  Diagnosis:       G93.41 - Encephalopathy Metabolic  Impression 72WC M with hx of HTN, HL, multiple myeloma s/p chemo, AVN s/p bilateral hip replacements, recent L eye CRAO, depression presenting with confusion and lightheadedness and bilateral leg weakness, for which he is amnestic, and now seems to be improved back to baseline. Teleneuro exam is currently unremarkable. CT head showed no acute abnormalities. Based on the description, the episode was most likely encephalopathy. It may have been a metabolic encephalopathy vs postprandial hypotension vs possibly a rarer entity such as alpha-gal syndrome. Given the amnesia and bilateral weakness, this was unlikely to have a cerebrovascular etiology.  CT HEAD: As Per Radiologist CT Head Showed No Acute Hemorrhage or Acute Core Infarct  Our recommendations are outlined below.  Recommendations: Since patient is now back to baseline and there is no suspicion for a cerebrovascular etiology, no further inpatient neuro workup is needed at this time  Disposition: Neurology will sign off. Reconsult if Needed   Labs Reviewed  Metrics: [facility] Notification Time: 10/07/2021 02:18:53 Stamp Time: 10/07/2021 02:20:41 Callback Response Time: 10/07/2021 02:20:56   ----------------------------------------------------------------------------------------------------  Chief Complaint: Confusion, lightheadedness  History of Present Illness: Patient is a 60 year old Male. 60yo M with hx of HTN, HL, multiple myeloma s/p chemo, AVN s/p bilateral hip replacements, recent L eye CRAO, depression presenting with confusion and lightheadedness. Patient had gone over to his girlfriend's house earlier and reportedly ate a very large  quantity of pork. Shortly after that he started to feel dizzy and nauseated with generalized weakness worse in the legs bilaterally and numbness in his legs. He felt like he had to lie down. His girlfriend reports he was confused and not acting himself, slow to respond. Patient states he does not recall most of the episode. He does not recall if his dizziness was vertigo vs lightheadedness. He has gradually been improving and currently he feels he is back to his baseline. Patient denies EtOH or recreational drug use.    Past Medical History:      Hypertension      Hyperlipidemia      There is no history of Diabetes Mellitus      There is no history of Coronary Artery Disease      There is no history of Stroke      There is no history of Covid-19      There is no history of Seizures      There is no history of Migraine Headaches Other PMH:  see HPI  Medications:  No Anticoagulant use  No Antiplatelet use Reviewed EMR for current medications  Allergies:  Reviewed  Social History: Alcohol Use: No Drug Use: No  Family History:  There is no family history of premature cerebrovascular disease pertinent to this consultation  ROS : 14 Points Review of Systems was performed and was negative except mentioned in HPI.  Past Surgical History: There Is No Surgical History Contributory To Todays Visit    Examination: BP(113/69), Pulse(59), Blood Glucose(102) 1A: Level of Consciousness - Alert; keenly responsive + 0 1B: Ask Month and Age - Both Questions Right + 0 1C: Blink Eyes & Squeeze Hands - Performs Both Tasks + 0 2: Test Horizontal Extraocular Movements - Normal + 0 3: Test Visual Fields -  No Visual Loss + 0 4: Test Facial Palsy (Use Grimace if Obtunded) - Normal symmetry + 0 5A: Test Left Arm Motor Drift - No Drift for 10 Seconds + 0 5B: Test Right Arm Motor Drift - No Drift for 10 Seconds + 0 6A: Test Left Leg Motor Drift - No Drift for 5 Seconds + 0 6B: Test Right Leg  Motor Drift - No Drift for 5 Seconds + 0 7: Test Limb Ataxia (FNF/Heel-Shin) - No Ataxia + 0 8: Test Sensation - Normal; No sensory loss + 0 9: Test Language/Aphasia - Normal; No aphasia + 0 10: Test Dysarthria - Normal + 0 11: Test Extinction/Inattention - No abnormality + 0  NIHSS Score: 0     Patient / Family was informed the Neurology Consult would occur via TeleHealth consult by way of interactive audio and video telecommunications and consented to receiving care in this manner.  Patient is being evaluated for possible acute neurologic impairment and high probability of imminent or life - threatening deterioration.I spent total of 35 minutes providing care to this patient, including time for face to face visit via telemedicine, review of medical records, imaging studies and discussion of findings with providers, the patient and / or family.   Dr Damaris Hippo   [facility] [phone]  Case [id number]

## 2021-10-07 NOTE — ED Notes (Signed)
Teleneurology called.

## 2021-10-07 NOTE — ED Notes (Signed)
Tele assessment finished

## 2021-11-19 DIAGNOSIS — H3582 Retinal ischemia: Secondary | ICD-10-CM | POA: Diagnosis not present

## 2021-11-19 DIAGNOSIS — H3412 Central retinal artery occlusion, left eye: Secondary | ICD-10-CM | POA: Diagnosis not present

## 2021-11-19 DIAGNOSIS — H4052X2 Glaucoma secondary to other eye disorders, left eye, moderate stage: Secondary | ICD-10-CM | POA: Diagnosis not present

## 2021-11-20 DIAGNOSIS — H3500 Unspecified background retinopathy: Secondary | ICD-10-CM | POA: Diagnosis not present

## 2021-11-20 DIAGNOSIS — H4052X2 Glaucoma secondary to other eye disorders, left eye, moderate stage: Secondary | ICD-10-CM | POA: Diagnosis not present

## 2021-11-20 DIAGNOSIS — H40001 Preglaucoma, unspecified, right eye: Secondary | ICD-10-CM | POA: Diagnosis not present

## 2021-12-08 DIAGNOSIS — E291 Testicular hypofunction: Secondary | ICD-10-CM | POA: Diagnosis not present

## 2021-12-08 DIAGNOSIS — E6609 Other obesity due to excess calories: Secondary | ICD-10-CM | POA: Diagnosis not present

## 2021-12-08 DIAGNOSIS — Z6831 Body mass index (BMI) 31.0-31.9, adult: Secondary | ICD-10-CM | POA: Diagnosis not present

## 2021-12-08 DIAGNOSIS — F419 Anxiety disorder, unspecified: Secondary | ICD-10-CM | POA: Diagnosis not present

## 2022-01-09 DIAGNOSIS — Z79899 Other long term (current) drug therapy: Secondary | ICD-10-CM | POA: Diagnosis not present

## 2022-01-09 DIAGNOSIS — R700 Elevated erythrocyte sedimentation rate: Secondary | ICD-10-CM | POA: Diagnosis not present

## 2022-01-09 DIAGNOSIS — H3412 Central retinal artery occlusion, left eye: Secondary | ICD-10-CM | POA: Diagnosis not present

## 2022-01-09 DIAGNOSIS — H1789 Other corneal scars and opacities: Secondary | ICD-10-CM | POA: Diagnosis not present

## 2022-01-09 DIAGNOSIS — H4052X2 Glaucoma secondary to other eye disorders, left eye, moderate stage: Secondary | ICD-10-CM | POA: Diagnosis not present

## 2022-01-09 DIAGNOSIS — H3589 Other specified retinal disorders: Secondary | ICD-10-CM | POA: Diagnosis not present

## 2022-01-09 DIAGNOSIS — H3582 Retinal ischemia: Secondary | ICD-10-CM | POA: Diagnosis not present

## 2022-01-12 ENCOUNTER — Emergency Department (HOSPITAL_COMMUNITY)
Admission: EM | Admit: 2022-01-12 | Discharge: 2022-01-12 | Disposition: A | Discharge status: Home / Self Care | Payer: BC Managed Care – PPO | Attending: Emergency Medicine | Admitting: Emergency Medicine

## 2022-01-12 ENCOUNTER — Encounter (HOSPITAL_COMMUNITY): Payer: Self-pay | Admitting: *Deleted

## 2022-01-12 ENCOUNTER — Emergency Department (HOSPITAL_COMMUNITY): Payer: BC Managed Care – PPO

## 2022-01-12 DIAGNOSIS — M10042 Idiopathic gout, left hand: Secondary | ICD-10-CM | POA: Diagnosis not present

## 2022-01-12 DIAGNOSIS — M25532 Pain in left wrist: Secondary | ICD-10-CM | POA: Diagnosis not present

## 2022-01-12 DIAGNOSIS — M79642 Pain in left hand: Secondary | ICD-10-CM

## 2022-01-12 DIAGNOSIS — M109 Gout, unspecified: Secondary | ICD-10-CM

## 2022-01-12 DIAGNOSIS — M7989 Other specified soft tissue disorders: Secondary | ICD-10-CM | POA: Diagnosis not present

## 2022-01-12 MED ORDER — PREDNISONE 10 MG PO TABS: ORAL_TABLET | ORAL | 0 refills | Status: DC

## 2022-01-12 MED ORDER — OXYCODONE-ACETAMINOPHEN 5-325 MG PO TABS
1.0000 | ORAL_TABLET | ORAL | 0 refills | Status: DC | PRN
Start: 2022-01-12 — End: 2022-01-20

## 2022-01-12 NOTE — ED Provider Notes (Signed)
?[facility] ?Provider Note ? ? ?CSN: [id number] ?Arrival date & time: 01/12/22  1155 ? ?  ? ?History ? ?Chief Complaint  ?Patient presents with  ? Hand Pain  ? ? ?Austin Griffith is a 61 y.o. male. ? ?Pt complains of swelling to left wrist.  Pt thinks he has gout.  Pt has been taking ibuprofen without relief  ? ?The history is provided by the patient. No language interpreter was used.  ?Hand Pain ?This is a new problem. The current episode started more than 2 days ago. The problem occurs constantly. The problem has been gradually worsening. Nothing aggravates the symptoms. Nothing relieves the symptoms. The treatment provided no relief.  ? ?  ? ?Home Medications ?Prior to Admission medications   ?Medication Sig Start Date End Date Taking? Authorizing Provider  ?escitalopram (LEXAPRO) 20 MG tablet Take 1 tablet (20 mg total) by mouth daily. 11/22/13   Heath Lark, MD  ?ibuprofen (ADVIL,MOTRIN) 200 MG tablet Take 200 mg by mouth every 6 (six) hours as needed.    [provider]  ?lansoprazole (PREVACID) 15 MG capsule Take 15 mg by mouth daily.    [provider]  ?loratadine (CLARITIN) 10 MG tablet Take 10 mg by mouth daily.    [provider]  ?LORazepam (ATIVAN) 1 MG tablet Take 1 tablet (1 mg total) by mouth 2 (two) times daily as needed for anxiety. 09/17/16   Evalee Jefferson, PA-C  ?   ? ?Allergies    ?Patient has no known allergies.   ? ?Review of Systems   ?Review of Systems  ?All other systems reviewed and are negative. ? ?Physical Exam ?Updated Vital Signs ?BP 106/88 (BP Location: Right Arm)   Pulse 96   Temp 98 ?F (36.7 ?C) (Oral)   Resp 20   Ht _0  (1.956 m)   Wt 117 kg   SpO2 97%   BMI 30.59 kg/m?  ?Physical Exam ?Vitals and nursing note reviewed.  ?Constitutional:   ?   General: He is not in acute distress. ?   Appearance: He is well-developed.  ?HENT:  ?   Head: Normocephalic and atraumatic.  ?Eyes:  ?   Conjunctiva/sclera: Conjunctivae normal.   ?Cardiovascular:  ?   Rate and Rhythm: Normal rate.  ?Pulmonary:  ?   Effort: Pulmonary effort is normal. No respiratory distress.  ?Musculoskeletal:     ?   General: Swelling and tenderness present.  ?   Cervical back: Neck supple.  ?   Comments: Swollen tender left wrist,  pain with range of motion  nv and ns intact   ?Skin: ?   General: Skin is warm and dry.  ?   Capillary Refill: Capillary refill takes less than 2 seconds.  ?Neurological:  ?   Mental Status: He is alert.  ?Psychiatric:     ?   Mood and Affect: Mood normal.  ? ? ?ED Results / Procedures / Treatments   ?Labs ?(all labs ordered are listed, but only abnormal results are displayed) ?Labs Reviewed - No data to display ? ?EKG ?None ? ?Radiology ?DG Wrist Complete Left ? ?Result Date: 01/12/2022 ?CLINICAL DATA:  Acute left wrist pain without known injury. EXAM: LEFT WRIST - COMPLETE 3+ VIEW COMPARISON:  None. FINDINGS: There is no evidence of fracture or dislocation. There is no evidence of arthropathy or other focal bone abnormality. Soft tissues are unremarkable. IMPRESSION: Negative. Electronically Signed   By: Marijo Conception M.D.   On:  01/12/2022 12:46  ? ?DG Hand Complete Left ? ?Result Date: 01/12/2022 ?CLINICAL DATA:  Acute left hand pain and swelling without known injury. EXAM: LEFT HAND - COMPLETE 3+ VIEW COMPARISON:  None. FINDINGS: There is no evidence of fracture or dislocation. There is no evidence of arthropathy or other focal bone abnormality. Soft tissues are unremarkable. IMPRESSION: Negative. Electronically Signed   By: Marijo Conception M.D.   On: 01/12/2022 12:47   ? ?Procedures ?Procedures  ? ? ?Medications Ordered in ED ?Medications - No data to display ? ?ED Course/ Medical Decision Making/ A&P ?  ?                        ?Medical Decision Making ?Pt has a swollen painful left wrist.  He has a history of gout in his foot and this feels the same  ? ?Amount and/or Complexity of Data Reviewed ?External Data Reviewed: notes. ?   Details:  hx of multiple myeloma/bone marrow transplant.  Oncology notes reviewed ?Radiology: ordered and independent interpretation performed. Decision-making details documented in ED Course. ?   Details: Xray wrist and hand left  no acute findings ? ?Risk ?Risk Details: Pt given a prescription for prednisone and percocet  ?Pt advised to follow up with his primary MD for recheck  ? ? ? ? ? ? ? ? ? ? ?Final Clinical Impression(s) / ED Diagnoses ?Final diagnoses:  ?Left hand pain  ?Acute gout of left hand, unspecified cause  ? ? ?Rx / DC Orders ?ED Discharge Orders   ? ?      Ordered  ?  predniSONE (DELTASONE) 10 MG tablet       ?Note to Pharmacy: Please provide dose pack or taper dosing instructions  ? 01/12/22 1328  ?  oxyCODONE-acetaminophen (PERCOCET) 5-325 MG tablet  Every 4 hours PRN       ? 01/12/22 1328  ? ?  ?  ? ?  ?An After Visit Summary was printed and given to the patient.  ? ?  ?Fransico Meadow, [place] ?01/12/22 1329 ? ?  ?Sherwood Gambler, MD ?01/15/22 [phone] ? ?

## 2022-01-12 NOTE — ED Triage Notes (Signed)
Left and pain with swelling ?

## 2022-01-12 NOTE — Discharge Instructions (Signed)
Follow up with your Physician for recheck this week  

## 2022-01-15 ENCOUNTER — Other Ambulatory Visit: Payer: Self-pay

## 2022-01-15 ENCOUNTER — Emergency Department (HOSPITAL_COMMUNITY): Payer: BC Managed Care – PPO

## 2022-01-15 ENCOUNTER — Emergency Department (HOSPITAL_COMMUNITY)
Admission: EM | Admit: 2022-01-15 | Discharge: 2022-01-15 | Disposition: A | Discharge status: Home / Self Care | Payer: BC Managed Care – PPO | Attending: Emergency Medicine | Admitting: Emergency Medicine

## 2022-01-15 ENCOUNTER — Encounter (HOSPITAL_COMMUNITY): Payer: Self-pay

## 2022-01-15 DIAGNOSIS — R600 Localized edema: Secondary | ICD-10-CM | POA: Diagnosis not present

## 2022-01-15 DIAGNOSIS — M25532 Pain in left wrist: Secondary | ICD-10-CM | POA: Insufficient documentation

## 2022-01-15 DIAGNOSIS — I10 Essential (primary) hypertension: Secondary | ICD-10-CM | POA: Diagnosis not present

## 2022-01-15 DIAGNOSIS — M79642 Pain in left hand: Secondary | ICD-10-CM | POA: Diagnosis not present

## 2022-01-15 DIAGNOSIS — M7989 Other specified soft tissue disorders: Secondary | ICD-10-CM | POA: Diagnosis not present

## 2022-01-15 LAB — BASIC METABOLIC PANEL
Anion gap: 9 (ref 5–15)
BUN: 26 mg/dL — ABNORMAL HIGH (ref 6–20)
CO2: 24 mmol/L (ref 22–32)
Calcium: 9 mg/dL (ref 8.9–10.3)
Chloride: 103 mmol/L (ref 98–111)
Creatinine, Ser: 1.3 mg/dL — ABNORMAL HIGH (ref 0.61–1.24)
GFR, Estimated: >60 mL/min (ref >60)
Glucose, Bld: 116 mg/dL — ABNORMAL HIGH (ref 70–99)
Potassium: 3.9 mmol/L (ref 3.5–5.1)
Sodium: 136 mmol/L (ref 135–145)

## 2022-01-15 LAB — CBC WITH DIFFERENTIAL/PLATELET
Abs Immature Granulocytes: 0.06 10*3/uL (ref 0.00–0.07)
Basophils Absolute: 0 10*3/uL (ref 0.0–0.1)
Basophils Relative: 0 %
Eosinophils Absolute: 0 10*3/uL (ref 0.0–0.5)
Eosinophils Relative: 0 %
HCT: 29.5 % — ABNORMAL LOW (ref 39.0–52.0)
Hemoglobin: 10.3 g/dL — ABNORMAL LOW (ref 13.0–17.0)
Immature Granulocytes: 1 %
Lymphocytes Relative: 18 %
Lymphs Abs: 1.1 10*3/uL (ref 0.7–4.0)
MCH: 32.4 pg (ref 26.0–34.0)
MCHC: 34.9 g/dL (ref 30.0–36.0)
MCV: 92.8 fL (ref 80.0–100.0)
Monocytes Absolute: 0.4 10*3/uL (ref 0.1–1.0)
Monocytes Relative: 7 %
Neutro Abs: 4.5 10*3/uL (ref 1.7–7.7)
Neutrophils Relative %: 74 %
Platelets: 149 10*3/uL — ABNORMAL LOW (ref 150–400)
RBC: 3.18 MIL/uL — ABNORMAL LOW (ref 4.22–5.81)
RDW: 12.2 % (ref 11.5–15.5)
WBC: 6.1 10*3/uL (ref 4.0–10.5)
nRBC: 0 % (ref 0.0–0.2)

## 2022-01-15 LAB — URIC ACID: Uric Acid, Serum: 5.5 mg/dL (ref 3.7–8.6)

## 2022-01-15 MED ORDER — DEXAMETHASONE SODIUM PHOSPHATE 10 MG/ML IJ SOLN
10.0000 mg | Freq: Once | INTRAMUSCULAR | Status: AC
Start: 2022-01-15 — End: 2022-01-15
  Administered 2022-01-15: 10 mg via INTRAMUSCULAR
  Filled 2022-01-15: qty 1

## 2022-01-15 NOTE — Discharge Instructions (Addendum)
Your lab tests are normal as discussed including a normal uric acid which lessens the likelihood that this is a gout flareup.  However you may finish the prednisone you were prescribed at your last visit.  You have been given a stronger long-acting steroid injection as well.  Your CT scan does not give [place] a definitive reason for the source of your symptoms either.  We have provided you with a splint to help with pain by minimizing your wrist movement.  I recommend continuing taking your Percocet in the short-term for pain relief, but you do need follow-up with an orthopedic specialist.  I also recommend taking arthritis strength tylenol for pain and inflammation.  This has been prescribed for you.  Elevation can also help with pain and swelling.  Call Dr. Lenon Curt for an office visit for further evaluation of your symptoms. ?

## 2022-01-15 NOTE — ED Provider Notes (Signed)
?[facility] ?Provider Note ? ? ?CSN: [id number] ?Arrival date & time: 01/15/22  0946 ? ?  ? ?History ? ?Chief Complaint  ?Patient presents with  ? Hand Pain  ? ? ?Austin Griffith is a 61 y.o. male with a history significant for multiple myeloma in remission x6 years, under the care of [employer], history of bony metastasis with this illness, also hypertension, hyperlipidemia, avascular necrosis of hips and a distant history of gout stating he had gout in one of his feet about 30 years ago presenting for evaluation of persistent pain and swelling at his left wrist.  He was seen here 3 days ago at which time x-rays of his wrist and hand were negative for fracture or dislocation (patient denies any trauma) and he was placed on prednisone and Percocet.  He endorses that his swelling is improved although he still does have swelling from his wrist through his fingertips but his pain is not improved.  His pain localizes to the wrist and he cannot flex or extend without exquisite pain at the site.  He denies pain in his fingers but endorses a tight sensation secondary to the swelling with attempts at flexion and extension of the fingers.  He denies fevers or chills, denies any other complaints.  He is frustrated because he has no use of his left hand currently secondary to the severity of pain.  He has taken as needed Percocet which offers temporary improvement. ? ?The history is provided by the patient.  ? ?  ? ?Home Medications ?Prior to Admission medications   ?Medication Sig Start Date End Date Taking? Authorizing Provider  ?escitalopram (LEXAPRO) 20 MG tablet Take 1 tablet (20 mg total) by mouth daily. 11/22/13   Heath Lark, MD  ?ibuprofen (ADVIL,MOTRIN) 200 MG tablet Take 200 mg by mouth every 6 (six) hours as needed.    [provider]  ?lansoprazole (PREVACID) 15 MG capsule Take 15 mg by mouth daily.    [provider]  ?loratadine (CLARITIN) 10 MG tablet Take 10 mg by  mouth daily.    [provider]  ?LORazepam (ATIVAN) 1 MG tablet Take 1 tablet (1 mg total) by mouth 2 (two) times daily as needed for anxiety. 09/17/16   Evalee Jefferson, PA-C  ?oxyCODONE-acetaminophen (PERCOCET) 5-325 MG tablet Take 1 tablet by mouth every 4 (four) hours as needed for severe pain. 01/12/22 01/12/23  Fransico Meadow, PA-C  ?predniSONE (DELTASONE) 10 MG tablet 6,5,4,3,2,1, taper 01/12/22   Fransico Meadow, PA-C  ?   ? ?Allergies    ?Patient has no known allergies.   ? ?Review of Systems   ?Review of Systems  ?Constitutional:  Negative for chills and fever.  ?Musculoskeletal:  Positive for arthralgias and joint swelling. Negative for myalgias.  ?Neurological:  Negative for weakness and numbness.  ?All other systems reviewed and are negative. ? ?Physical Exam ?Updated Vital Signs ?BP (!) 152/102   Pulse 68   Temp 98 ?F (36.7 ?C)   Resp 16   Ht 6' 5"  (1.956 m)   Wt 117 kg   SpO2 100%   BMI 30.59 kg/m?  ?Physical Exam ?Constitutional:   ?   Appearance: He is well-developed.  ?HENT:  ?   Head: Atraumatic.  ?Cardiovascular:  ?   Comments: Pulses equal bilaterally ?Musculoskeletal:     ?   General: Swelling and tenderness present.  ?   Left wrist: Swelling and bony tenderness present.  ?   Cervical back:  Normal range of motion.  ?   Comments: Exquisite tenderness dorsal left wrist which radiates into the volar aspect.  There is no palpable deformity.  Moderate edema from the wrist through fingertips.  No erythema, skin is intact without lesions or wounds.  No pain or edema from mid forearm through shoulder.  ?Lymphadenopathy:  ?   Upper Body:  ?   Left upper body: No axillary or epitrochlear adenopathy.  ?Skin: ?   General: Skin is warm and dry.  ?   Findings: No erythema, lesion or rash.  ?Neurological:  ?   Mental Status: He is alert.  ?   Sensory: No sensory deficit.  ?   Motor: No weakness.  ?   Deep Tendon Reflexes: Reflexes normal.  ? ? ?ED Results / Procedures / Treatments   ?Labs ?(all labs  ordered are listed, but only abnormal results are displayed) ?Labs Reviewed  ?CBC WITH DIFFERENTIAL/PLATELET - Abnormal; Notable for the following components:  ?    Result Value  ? RBC 3.18 (*)   ? Hemoglobin 10.3 (*)   ? HCT 29.5 (*)   ? Platelets 149 (*)   ? All other components within normal limits  ?BASIC METABOLIC PANEL - Abnormal; Notable for the following components:  ? Glucose, Bld 116 (*)   ? BUN 26 (*)   ? Creatinine, Ser 1.30 (*)   ? All other components within normal limits  ?URIC ACID  ? ? ?EKG ?None ? ?Radiology ?CT Wrist Left Wo Contrast ? ?Result Date: 01/15/2022 ?CLINICAL DATA:  Wrist pain, infection suspected. Patient with hand pain and swelling, was seen here on Monday and treated for gout. EXAM: CT OF THE LEFT WRIST WITHOUT CONTRAST TECHNIQUE: Multidetector CT imaging was performed according to the standard protocol. Multiplanar CT image reconstructions were also generated. RADIATION DOSE REDUCTION: This exam was performed according to the departmental dose-optimization program which includes automated exposure control, adjustment of the mA and/or kV according to patient size and/or use of iterative reconstruction technique. COMPARISON:  Radiographs dated January 12, 2022 FINDINGS: Bones/Joint/Cartilage No fracture or dislocation. Normal alignment. No joint effusion. Ligaments Ligaments are suboptimally evaluated by CT. Muscles and Tendons Thenar and hypothenar muscles and tendons of the flexor and extensor compartment are within normal limits. No appreciable fluid collection or hematoma. Soft tissue Generalized subcutaneous soft tissue edema. No fluid collection or abscess. IMPRESSION: 1.  No evidence of fracture or dislocation. 2. No appreciable fluid collection or abscess on this unenhanced examination. Electronically Signed   By: Keane Police D.O.   On: 01/15/2022 15:00   ? ?Procedures ?Procedures  ? ? ?Medications Ordered in ED ?Medications  ?dexamethasone (DECADRON) injection 10 mg (10 mg  Intramuscular Given 01/15/22 1309)  ? ? ?ED Course/ Medical Decision Making/ A&P ?  ?                        ?Medical Decision Making ?Patient with left wrist pain and edema of unclear etiology.  Differential diagnoses include occludes subacute fracture although he denies any obvious traumas.  Currently being treated as a gouty flare but not responding to this treatment although he does state the swelling is improved since starting the prednisone.  Still with significant pain with any range of motion.  Other concern regarding septic joint however there is no exam findings to suggest an effusion and he has a normal WBC count, no erythema or increased warmth about the joint making this diagnosis less  likely.  He does have a history of multiple myeloma, concern for possible early myeloma lesion despite normal plain imaging.  We attempted to obtain an MRI of the the wrist, however he did not tolerate the MRI modality.  We got a CT scan to obtain better definition, no obvious abscess, effusion or bony erosion.  Patient was placed in a Velcro wrist splint which gave him significant comfort about the wrist stating the compression and the lack of movement almost resolved his pain symptoms.  He was encouraged elevation, arthritis strength Tylenol, he also has a few prednisone tablets left he will finish these as well.  He was given referral to hand specialty Dr. Lenon Curt for as needed follow-up if his symptoms do not continue to completely resolve with today's treatment plan. ? ?Amount and/or Complexity of Data Reviewed ?Labs: ordered. ?   Details: Labs are normal including a normal WBC count. ?Radiology: ordered. ?   Details: Plan imaging from prior visit reviewed and negative.  CT imaging today reviewed. ? ?Risk ?Prescription drug management. ? ? ? ? ? ? ? ? ? ? ?Final Clinical Impression(s) / ED Diagnoses ?Final diagnoses:  ?Left wrist pain  ? ? ?Rx / DC Orders ?ED Discharge Orders   ? ? None  ? ?  ? ? ?  ?Evalee Jefferson,  PA-C ?01/15/22 1556 ? ?  ?Sherwood Gambler, MD ?01/16/22 1106 ? ?

## 2022-01-15 NOTE — ED Notes (Signed)
Patient transported to MRI 

## 2022-01-15 NOTE — ED Triage Notes (Signed)
Patient with hand pain and swelling, was seen here on Monday and treated for gout.  ?

## 2022-01-20 ENCOUNTER — Ambulatory Visit (INDEPENDENT_AMBULATORY_CARE_PROVIDER_SITE_OTHER): Payer: BC Managed Care – PPO | Admitting: Orthopaedic Surgery

## 2022-01-20 ENCOUNTER — Encounter: Payer: Self-pay | Admitting: Orthopaedic Surgery

## 2022-01-20 VITALS — BP 147/102 | HR 87 | Ht 77.0 in | Wt 253.2 lb

## 2022-01-20 DIAGNOSIS — C9001 Multiple myeloma in remission: Secondary | ICD-10-CM | POA: Diagnosis not present

## 2022-01-20 DIAGNOSIS — M10032 Idiopathic gout, left wrist: Secondary | ICD-10-CM

## 2022-01-20 MED ORDER — ALLOPURINOL 300 MG PO TABS: 300.0000 mg | ORAL_TABLET | Freq: Every day | ORAL | 5 refills | Status: DC

## 2022-01-20 MED ORDER — COLCHICINE 0.6 MG PO TABS: ORAL_TABLET | ORAL | 3 refills | Status: DC

## 2022-01-20 MED ORDER — OXYCODONE-ACETAMINOPHEN 5-325 MG PO TABS: ORAL_TABLET | ORAL | 0 refills | Status: DC

## 2022-01-20 MED ORDER — PREDNISONE 5 MG (21) PO TBPK: ORAL_TABLET | ORAL | 0 refills | Status: DC

## 2022-01-20 NOTE — Progress Notes (Signed)
? ?Subjective:  ? ? Patient ID: Austin Griffith, male    DOB: Jun 01, 1961, 61 y.o.   MRN: [id number] ? ?HPI ?He has marked pain of the left wrist that is not going away. ? ?He has multiple myeloma for the past 5 to 6 years, treated at [facility].  He is now in remission. He has bone metastasis.  I have reviewed several notes. ? ?He has gout and had last attack many years ago.   He is not on any medicine for the gout. ? ?He went to the ER on 01-12-22 for the wrist pain and again on 01-15-22 as he was worse.  On the second visit CT scan of the wrist was done as well as uric acid drawn (5.5) and other X-rays. ? ?I have reviewed the ER notes on both occasions and the X-rays. ? ?I have independently reviewed and interpreted x-rays of this patient done at another site by another physician or qualified health professional. ? ?He has been given a wrist cock-up splint that helps only slightly.  It is very painful to put on and remove.  He has marked pain of the left wrist and dorsal swelling but no numbness or no redness. ? ?He is not getting any better. ? 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 ? ?Review of Systems  ?Constitutional:  Positive for activity change.  ?Musculoskeletal:  Positive for arthralgias, joint swelling and myalgias.  ?Hematological:   ?     History of multiple myeloma with metastasis.  ?All other systems reviewed and are negative. ?For Review of Systems, all other systems reviewed and are negative. ? ?The following is a summary of the past history medically, past history surgically, known current medicines, social history and family history.  This information is gathered electronically by the computer from prior information and documentation.  I review this each visit and have found including this information at this point in the chart is beneficial and informative.  ? ?Past Medical History:  ?Diagnosis Date  ? Acid reflux   ? Anxiety 07/05/2013  ? AVN (avascular necrosis of bone) ([place])   ?  bilateral hips; s/p hip replacement in 1991 and 2009  ? Blurred vision   ? Present for "many years"  ? Bone metastases 03/31/13  ?  MR Abdomen -Lower Thoracic and Upper Lumbar spine  ? Depression 11/22/2013  ? Epistaxis 02/08/2012  ? 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 Fatigue   ? Fatigue 05/25/12  ? "Mild"  ? Headache(784.0) 02/08/2012  ? Right Sided Headache Behind Right Eye  ? History of radiation therapy 02/29/12-04/08/12  ? right  ethmoid sinus in 50.4Gy in 20 fxs  ? Hyperlipidemia   ? diet control   ? Hypertension   ? Knee pain 10/18/2013  ? Liver lesion 03/31/13  ? MR Abdomen  ? Medically noncompliant 09/29/2015  ? Multiple myeloma in remission ([place]) 11/22/2013  ? Multiple myeloma, in relapse 07/05/2013  ? Nausea alone 09/12/2013  ? Neutropenic fever ([place]) 09/18/2013  ? On antineoplastic chemotherapy started 04/08/13  ? Revlimid/ Velcade/ Dex  ? Pancreatic lesion 03/31/13  ?  MR Abdomen - Ucinate Process  ? Plasmacytoma ([place])   ?  Right Ethmoid Sinus; bone marrow biopsy on 03/10/13 showed normal myeloma FISH panel and cytogenetics.   ? S/P radiation therapy 03/28/2013  ? Left posterior 7th Rib / 8Gy in 1 fraction  ? Shingles Feb 24, 2015  ? Thrombocytopenia ([place]) 05/25/12  ? ? ?  Past Surgical History:  ?Procedure Laterality Date  ? Bilateral hip replacement  1991; and 2009  ? due to bilateral hip AVN's. / reports left hip was not replaced  ? EUS N/A 03/24/2013  ? Procedure: UPPER ENDOSCOPIC ULTRASOUND (EUS) LINEAR;  Surgeon: Beryle Beams, MD;  Location: [facility] END[facility]COPY;  Service: Endoscopy;  Laterality: N/A;  ? FINE NEEDLE ASPIRATION N/A 03/24/2013  ? Procedure: FINE NEEDLE ASPIRATION (FNA) LINEAR;  Surgeon: Beryle Beams, MD;  Location: [facility] ENDOSCOPY;  Service: Endoscopy;  Laterality: N/A;  ? MASS BIOPSY  01/26/12  ? Right Ethmoid Mass - Plasma Cell Neoplasm  ? ? ?Current Outpatient Medications on File Prior to Visit  ?Medication Sig Dispense Refill  ? escitalopram (LEXAPRO) 20 MG tablet Take 1 tablet (20 mg  total) by mouth daily. 90 tablet 3  ? ibuprofen (ADVIL,MOTRIN) 200 MG tablet Take 200 mg by mouth every 6 (six) hours as needed.    ? lansoprazole (PREVACID) 15 MG capsule Take 15 mg by mouth daily.    ? loratadine (CLARITIN) 10 MG tablet Take 10 mg by mouth daily.    ? LORazepam (ATIVAN) 1 MG tablet Take 1 tablet (1 mg total) by mouth 2 (two) times daily as needed for anxiety. 20 tablet 0  ? ?No current facility-administered medications on file prior to visit.  ? ? ?Social History  ? ?Socioeconomic History  ? Marital status: Divorced  ?  Spouse name: Not on file  ? Number of children: 2  ? Years of education: Not on file  ? Highest education level: Not on file  ?Occupational History  ? Occupation: owner  ?  Comment: owns a dump truck company  ?Tobacco Use  ? Smoking status: Never  ? Smokeless tobacco: Never  ?Vaping Use  ? Vaping Use: Never used  ?Substance and Sexual Activity  ? Alcohol use: Yes  ?  Comment: Hx of Alcohol Abuse.09/18/13 denies but says drinks etoh  ? Drug use: No  ? Sexual activity: Yes  ?  Birth control/protection: None  ?Other Topics Concern  ? Not on file  ?Social History Narrative  ? Married  ? Owner of [employer]  ? ?Social Determinants of Health  ? ?Financial Resource Strain: Not on file  ?Food Insecurity: Not on file  ?Transportation Needs: Not on file  ?Physical Activity: Not on file  ?Stress: Not on file  ?Social Connections: Not on file  ?Intimate Partner Violence: Not on file  ? ? ?Family History  ?Adopted: Yes  ? ? ?BP (!) 147/102   Pulse 87   Ht 6' 5"  (1.956 m)   Wt 253 lb 3.2 oz (114.9 kg)   BMI 30.03 kg/m?  ? ?Body mass index is 30.03 kg/m?. ? ?   ?Objective:  ? Physical Exam ?Vitals and nursing note reviewed. Exam conducted with a chaperone present.  ?Constitutional:   ?   Appearance: He is well-developed.  ?HENT:  ?   Head: Normocephalic and atraumatic.  ?Eyes:  ?   Conjunctiva/sclera: Conjunctivae normal.  ?   Pupils: Pupils are equal, round, and reactive to light.   ?Cardiovascular:  ?   Rate and Rhythm: Normal rate and regular rhythm.  ?Pulmonary:  ?   Effort: Pulmonary effort is normal.  ?Abdominal:  ?   Palpations: Abdomen is soft.  ?Musculoskeletal:  ?     Hands: ? ?   Cervical back: Normal range of motion and neck supple.  ?Skin: ?   General: Skin is warm and  dry.  ?Neurological:  ?   Mental Status: He is alert and oriented to person, place, and time.  ?   Cranial Nerves: No cranial nerve deficit.  ?   Motor: No abnormal muscle tone.  ?   Coordination: Coordination normal.  ?   Deep Tendon Reflexes: Reflexes are normal and symmetric. Reflexes normal.  ?Psychiatric:     ?   Behavior: Behavior normal.     ?   Thought Content: Thought content normal.     ?   Judgment: Judgment normal.  ? ? ? ? ? ?   ?Assessment & Plan:  ? ?Encounter Diagnoses  ?Name Primary?  ? Acute idiopathic gout of left wrist Yes  ? Multiple myeloma in remission ([place])   ? ?I feel he has acute gout of the left wrist despite a normal uric acid.  I feel his multiple myeloma gives a false negative reading.  He has metastasis and is in remission. ? ?He has not been on allopurinol.  I will begin this as well as colchicine and prednisone and pain medicine. ? ?I have reviewed the [facility] web site prior to prescribing narcotic medicine for this patient. ? ?I have explained what gout is and that he inherited it.  I have explained bone metastasis and underlying blood dyscrasia can cause gout. ? ?Continue the splint. ? ?Return in one week. ? ?Call if any problem. ? ?Precautions discussed. ? ?Electronically Signed ?Sanjuana Kava, MD ?4/11/202310:41 AM ? ?

## 2022-01-21 DIAGNOSIS — H3412 Central retinal artery occlusion, left eye: Secondary | ICD-10-CM | POA: Diagnosis not present

## 2022-01-21 DIAGNOSIS — H3582 Retinal ischemia: Secondary | ICD-10-CM | POA: Diagnosis not present

## 2022-01-26 DIAGNOSIS — H4089 Other specified glaucoma: Secondary | ICD-10-CM | POA: Diagnosis not present

## 2022-01-26 DIAGNOSIS — Z961 Presence of intraocular lens: Secondary | ICD-10-CM | POA: Diagnosis not present

## 2022-01-27 ENCOUNTER — Ambulatory Visit (INDEPENDENT_AMBULATORY_CARE_PROVIDER_SITE_OTHER): Payer: BC Managed Care – PPO | Admitting: Orthopaedic Surgery

## 2022-01-27 ENCOUNTER — Encounter: Payer: Self-pay | Admitting: Orthopaedic Surgery

## 2022-01-27 DIAGNOSIS — M10032 Idiopathic gout, left wrist: Secondary | ICD-10-CM | POA: Diagnosis not present

## 2022-01-27 DIAGNOSIS — C9001 Multiple myeloma in remission: Secondary | ICD-10-CM

## 2022-01-27 NOTE — Progress Notes (Signed)
My wrist is better. ? ?He has no swelling or redness of the left wrist now.  He took the prednisone dose pack, the colchicine and the allopurinol.  He is out of the splint now and feels much better.  He has some tenderness of the left wrist but no pain.  ROM of the fingers is full now where last week he could hardly move them without pain. ? ?I have told him to continue the allopurinol daily.  He should let his cancer doctor know he is on it. ? ?Encounter Diagnoses  ?Name Primary?  ? Acute idiopathic gout of left wrist Yes  ? Multiple myeloma in remission ([place])   ? ?I will see him as needed. ? ?He may get flares secondary to the myeloma. ? ?Call if any problem. ? ?Precautions discussed. ? ?Electronically Signed ?Sanjuana Kava, MD ?4/18/20239:23 AM ? ?

## 2022-01-28 DIAGNOSIS — H4089 Other specified glaucoma: Secondary | ICD-10-CM | POA: Diagnosis not present

## 2022-01-28 DIAGNOSIS — Z961 Presence of intraocular lens: Secondary | ICD-10-CM | POA: Diagnosis not present

## 2022-02-02 DIAGNOSIS — S0500XA Injury of conjunctiva and corneal abrasion without foreign body, unspecified eye, initial encounter: Secondary | ICD-10-CM | POA: Diagnosis not present

## 2022-02-02 DIAGNOSIS — H4089 Other specified glaucoma: Secondary | ICD-10-CM | POA: Diagnosis not present

## 2022-02-03 ENCOUNTER — Telehealth: Payer: Self-pay

## 2022-02-03 NOTE — Telephone Encounter (Signed)
Austin Griffith from [place] on [address] left message stating that  ?this patient is no longer allowed on any [facility] property anymore due to communicating threats. ?She stated that Dr. Luna Glasgow does send in prescriptions for this patient and she wanted to be sure he was aware. ?

## 2022-02-11 DIAGNOSIS — S0501XD Injury of conjunctiva and corneal abrasion without foreign body, right eye, subsequent encounter: Secondary | ICD-10-CM | POA: Diagnosis not present

## 2022-02-11 DIAGNOSIS — Z961 Presence of intraocular lens: Secondary | ICD-10-CM | POA: Diagnosis not present

## 2022-04-16 DIAGNOSIS — E7849 Other hyperlipidemia: Secondary | ICD-10-CM | POA: Diagnosis not present

## 2022-04-16 DIAGNOSIS — I10 Essential (primary) hypertension: Secondary | ICD-10-CM | POA: Diagnosis not present

## 2022-04-16 DIAGNOSIS — Z6830 Body mass index (BMI) 30.0-30.9, adult: Secondary | ICD-10-CM | POA: Diagnosis not present

## 2022-04-16 DIAGNOSIS — C9001 Multiple myeloma in remission: Secondary | ICD-10-CM | POA: Diagnosis not present

## 2022-04-16 DIAGNOSIS — F419 Anxiety disorder, unspecified: Secondary | ICD-10-CM | POA: Diagnosis not present

## 2022-04-16 DIAGNOSIS — H409 Unspecified glaucoma: Secondary | ICD-10-CM | POA: Diagnosis not present

## 2022-04-16 DIAGNOSIS — R7309 Other abnormal glucose: Secondary | ICD-10-CM | POA: Diagnosis not present

## 2022-04-16 DIAGNOSIS — E291 Testicular hypofunction: Secondary | ICD-10-CM | POA: Diagnosis not present

## 2022-04-16 DIAGNOSIS — E782 Mixed hyperlipidemia: Secondary | ICD-10-CM | POA: Diagnosis not present

## 2022-04-16 DIAGNOSIS — E6609 Other obesity due to excess calories: Secondary | ICD-10-CM | POA: Diagnosis not present

## 2022-04-16 DIAGNOSIS — H3582 Retinal ischemia: Secondary | ICD-10-CM | POA: Diagnosis not present

## 2022-04-30 ENCOUNTER — Telehealth: Payer: Self-pay | Admitting: *Deleted

## 2022-04-30 NOTE — Chronic Care Management (AMB) (Signed)
  Care Management   Outreach Note  04/30/2022 Name: Austin Griffith MRN: [id number] DOB: 03-Mar-1961  Referred by: Sharilyn Sites, MD Reason for referral : Care Coordination (Initial outreach to schedule with clinician from segmented list [place] [facility])   An unsuccessful telephone outreach was attempted today. The patient was referred to the case management team for assistance with care management and care coordination.   Follow Up Plan:  A HIPAA compliant phone message was left for the patient providing contact information and requesting a return call.  The care management team will reach out to the patient again over the next 7 days.  If patient returns call to provider office, please advise to call [place]* at [phone].* [place]  Direct Dial: [phone]

## 2022-05-06 NOTE — Chronic Care Management (AMB) (Signed)
  Care Management   Outreach Note  05/06/2022 Name: Austin Griffith MRN: [id number] DOB: [birth year]  An unsuccessful telephone outreach was attempted today. The patient was referred to the case management team for assistance with care management and care coordination.   Follow Up Plan:  A HIPAA compliant phone message was left for the patient providing contact information and requesting a return call.  The care management team will reach out to the patient again over the next 7 days.  If patient returns call to provider office, please advise to call [place]* at [phone].*  [place]  Direct Dial: [phone]

## 2022-05-06 NOTE — Chronic Care Management (AMB) (Signed)
  Care Coordination  Note  05/06/2022 Name: TRAYVEON BECKFORD MRN: [id number] DOB: [birth year]  ABNER ARDIS is a 61 y.o. year old male who is a primary care patient of Sharilyn Sites, MD. I reached out to Leone Payor by phone today to offer care coordination services.      Mr. Fesler was given information about Care Coordination services today including:  The Care Coordination services include support from the care team which includes your Nurse Coordinator, Clinical Social Worker, or Pharmacist.  The Care Coordination team is here to help remove barriers to the health concerns and goals most important to you. Care Coordination services are voluntary and the patient may decline or stop services at any time by request to their care team member.   Patient agreed to services and verbal consent obtained.   Follow up plan: Telephone appointment with care coordination team member scheduled for:05/12/22  [place]  Direct Dial: [phone]

## 2022-05-12 ENCOUNTER — Ambulatory Visit: Payer: Self-pay | Admitting: *Deleted

## 2022-05-12 NOTE — Patient Outreach (Signed)
  Care Management   Outreach Note  05/12/2022 Name: MAXINE HUYNH MRN: [id number] DOB: 03/13/1961  An unsuccessful telephone outreach was attempted today. The patient was referred to the case management team for assistance with care management and care coordination.   Follow Up Plan:  Will have Care Guide reschedule member for 2nd outreach attempt.  Valente David, RN, MSN, [facility]  Care Coordinator [phone]

## 2022-05-13 DIAGNOSIS — H4089 Other specified glaucoma: Secondary | ICD-10-CM | POA: Diagnosis not present

## 2022-05-13 DIAGNOSIS — H04123 Dry eye syndrome of bilateral lacrimal glands: Secondary | ICD-10-CM | POA: Diagnosis not present

## 2022-05-25 DIAGNOSIS — H469 Unspecified optic neuritis: Secondary | ICD-10-CM | POA: Diagnosis not present

## 2022-05-25 DIAGNOSIS — H40001 Preglaucoma, unspecified, right eye: Secondary | ICD-10-CM | POA: Diagnosis not present

## 2022-05-25 DIAGNOSIS — H44512 Absolute glaucoma, left eye: Secondary | ICD-10-CM | POA: Diagnosis not present

## 2022-05-25 DIAGNOSIS — H3500 Unspecified background retinopathy: Secondary | ICD-10-CM | POA: Diagnosis not present

## 2022-05-25 DIAGNOSIS — H4052X3 Glaucoma secondary to other eye disorders, left eye, severe stage: Secondary | ICD-10-CM | POA: Diagnosis not present

## 2022-05-26 DIAGNOSIS — M79672 Pain in left foot: Secondary | ICD-10-CM | POA: Diagnosis not present

## 2022-05-28 ENCOUNTER — Telehealth: Payer: Self-pay | Admitting: *Deleted

## 2022-05-28 NOTE — Chronic Care Management (AMB) (Signed)
  Care Coordination Note  05/28/2022 Name: Austin Griffith MRN: [id number] DOB: 07/07/1961  Austin Griffith is a 61 y.o. year old male who is a primary care patient of Sharilyn Sites, MD and is actively engaged with the care management team. I reached out to Leone Payor by phone today to assist with re-scheduling an initial visit with the RN Case Manager  Follow up plan: Patient declines further follow up and engagement by the care management team. Appropriate care team members and provider have been notified via electronic communication.   [place]  Direct Dial: [phone]

## 2022-06-16 ENCOUNTER — Inpatient Hospital Stay: Payer: BC Managed Care – PPO | Attending: Hematology and Oncology

## 2022-06-16 ENCOUNTER — Other Ambulatory Visit: Payer: Self-pay

## 2022-06-16 DIAGNOSIS — E538 Deficiency of other specified B group vitamins: Secondary | ICD-10-CM | POA: Insufficient documentation

## 2022-06-16 DIAGNOSIS — R7989 Other specified abnormal findings of blood chemistry: Secondary | ICD-10-CM

## 2022-06-16 DIAGNOSIS — D61818 Other pancytopenia: Secondary | ICD-10-CM | POA: Insufficient documentation

## 2022-06-16 DIAGNOSIS — C9001 Multiple myeloma in remission: Secondary | ICD-10-CM | POA: Diagnosis not present

## 2022-06-16 LAB — CBC WITH DIFFERENTIAL/PLATELET
Abs Immature Granulocytes: 0.01 10*3/uL (ref 0.00–0.07)
Basophils Absolute: 0 10*3/uL (ref 0.0–0.1)
Basophils Relative: 1 %
Eosinophils Absolute: 0.2 10*3/uL (ref 0.0–0.5)
Eosinophils Relative: 5 %
HCT: 32.5 % — ABNORMAL LOW (ref 39.0–52.0)
Hemoglobin: 11.7 g/dL — ABNORMAL LOW (ref 13.0–17.0)
Immature Granulocytes: 0 %
Lymphocytes Relative: 23 %
Lymphs Abs: 0.8 10*3/uL (ref 0.7–4.0)
MCH: 32.1 pg (ref 26.0–34.0)
MCHC: 36 g/dL (ref 30.0–36.0)
MCV: 89 fL (ref 80.0–100.0)
Monocytes Absolute: 0.3 10*3/uL (ref 0.1–1.0)
Monocytes Relative: 8 %
Neutro Abs: 2.1 10*3/uL (ref 1.7–7.7)
Neutrophils Relative %: 63 %
Platelets: 110 10*3/uL — ABNORMAL LOW (ref 150–400)
RBC: 3.65 MIL/uL — ABNORMAL LOW (ref 4.22–5.81)
RDW: 12.3 % (ref 11.5–15.5)
WBC: 3.3 10*3/uL — ABNORMAL LOW (ref 4.0–10.5)
nRBC: 0 % (ref 0.0–0.2)

## 2022-06-16 LAB — COMPREHENSIVE METABOLIC PANEL
ALT: 18 U/L (ref 0–44)
AST: 22 U/L (ref 15–41)
Albumin: 4 g/dL (ref 3.5–5.0)
Alkaline Phosphatase: 59 U/L (ref 38–126)
Anion gap: 5 (ref 5–15)
BUN: 12 mg/dL (ref 6–20)
CO2: 28 mmol/L (ref 22–32)
Calcium: 9.2 mg/dL (ref 8.9–10.3)
Chloride: 103 mmol/L (ref 98–111)
Creatinine, Ser: 1.53 mg/dL — ABNORMAL HIGH (ref 0.61–1.24)
GFR, Estimated: 52 mL/min — ABNORMAL LOW (ref >60)
Glucose, Bld: 99 mg/dL (ref 70–99)
Potassium: 3.6 mmol/L (ref 3.5–5.1)
Sodium: 136 mmol/L (ref 135–145)
Total Bilirubin: 0.7 mg/dL (ref 0.3–1.2)
Total Protein: 6.7 g/dL (ref 6.5–8.1)

## 2022-06-16 LAB — VITAMIN B12: Vitamin B-12: 204 pg/mL (ref 180–914)

## 2022-06-17 LAB — KAPPA/LAMBDA LIGHT CHAINS
Kappa free light chain: 33.4 mg/L — ABNORMAL HIGH (ref 3.3–19.4)
Kappa, lambda light chain ratio: 1.3 (ref 0.26–1.65)
Lambda free light chains: 25.7 mg/L (ref 5.7–26.3)

## 2022-06-22 LAB — MULTIPLE MYELOMA PANEL, SERUM
Albumin SerPl Elph-Mcnc: 3.3 g/dL (ref 2.9–4.4)
Albumin/Glob SerPl: 1.3 (ref 0.7–1.7)
Alpha 1: 0.2 g/dL (ref 0.0–0.4)
Alpha2 Glob SerPl Elph-Mcnc: 0.7 g/dL (ref 0.4–1.0)
B-Globulin SerPl Elph-Mcnc: 1 g/dL (ref 0.7–1.3)
Gamma Glob SerPl Elph-Mcnc: 0.7 g/dL (ref 0.4–1.8)
Globulin, Total: 2.7 g/dL (ref 2.2–3.9)
IgA: 381 mg/dL (ref 90–386)
IgG (Immunoglobin G), Serum: 679 mg/dL (ref 603–1613)
IgM (Immunoglobulin M), Srm: 83 mg/dL (ref 20–172)
Total Protein ELP: 6 g/dL (ref 6.0–8.5)

## 2022-06-23 ENCOUNTER — Encounter: Payer: Self-pay | Admitting: Hematology and Oncology

## 2022-06-23 ENCOUNTER — Other Ambulatory Visit: Payer: Self-pay

## 2022-06-23 ENCOUNTER — Inpatient Hospital Stay (HOSPITAL_BASED_OUTPATIENT_CLINIC_OR_DEPARTMENT_OTHER): Payer: BC Managed Care – PPO | Admitting: Hematology and Oncology

## 2022-06-23 VITALS — BP 121/75 | HR 85 | Temp 98.5°F | Resp 18 | Ht 77.0 in | Wt 251.2 lb

## 2022-06-23 DIAGNOSIS — E538 Deficiency of other specified B group vitamins: Secondary | ICD-10-CM

## 2022-06-23 DIAGNOSIS — D61818 Other pancytopenia: Secondary | ICD-10-CM | POA: Diagnosis not present

## 2022-06-23 DIAGNOSIS — C9001 Multiple myeloma in remission: Secondary | ICD-10-CM

## 2022-06-23 NOTE — Assessment & Plan Note (Signed)
He has mild pancytopenia likely due to borderline vitamin B12 deficiency I recommend high-dose vitamin B12 supplement

## 2022-06-23 NOTE — Progress Notes (Signed)
[place] OFFICE PROGRESS NOTE  Patient Care Team: Sharilyn Sites, MD as PCP - General (Family Medicine) Jeanann Lewandowsky, MD as PCP - Hematology/Oncology (Hematology and Oncology) Melissa Montane, MD as Attending Physician (Otolaryngology) Eppie Gibson, MD (Radiation Oncology) Elayne Snare, MD as Attending Physician (Endocrinology) Carol Ada, MD as Attending Physician (Gastroenterology) Heath Lark, MD as Consulting Physician (Hematology and Oncology) Valente David, RN as [place] Management  ASSESSMENT & PLAN:  Multiple myeloma in remission [facility]) I reviewed myeloma panel with the patient Currently, he has no signs of disease recurrence His pancytopenia is unlikely to be related He declined returning back to [facility] for post transplant follow-up From the myeloma standpoint, I plan to see him once a year with history, physical examination and blood work The patient is educated to watch out for signs and symptoms of cancer recurrence  Pancytopenia, acquired [facility]) He has mild pancytopenia likely due to borderline vitamin B12 deficiency I recommend high-dose vitamin B12 supplement  B12 deficiency He has borderline B12 deficiency causing pancytopenia I recommend oral vitamin B12 supplement daily I plan to recheck again next year  Orders Placed This Encounter  Procedures   Vitamin B12    Standing Status:   Future    Standing Expiration Date:   06/24/2023    All questions were answered. The patient knows to call the clinic with any problems, questions or concerns. The total time spent in the appointment was 20 minutes encounter with patients including review of chart and various tests results, discussions about plan of care and coordination of care plan   Heath Lark, MD 06/23/2022 12:29 PM  INTERVAL HISTORY: Please see below for problem oriented charting. he returns for treatment follow-up for history of multiple myeloma status post bone  marrow transplant According to the patient, he has visual loss recently and had significant evaluation by multiple specialists He is not taking vitamin B12 consistently No recent bone pain or infection  REVIEW OF SYSTEMS:   Constitutional: Denies fevers, chills or abnormal weight loss Ears, nose, mouth, throat, and face: Denies mucositis or sore throat Respiratory: Denies cough, dyspnea or wheezes Cardiovascular: Denies palpitation, chest discomfort or lower extremity swelling Gastrointestinal:  Denies nausea, heartburn or change in bowel habits Skin: Denies abnormal skin rashes Lymphatics: Denies new lymphadenopathy or easy bruising Neurological:Denies numbness, tingling or new weaknesses Behavioral/Psych: Mood is stable, no new changes  All other systems were reviewed with the patient and are negative.  I have reviewed the past medical history, past surgical history, social history and family history with the patient and they are unchanged from previous note.  ALLERGIES:  has No Known Allergies.  MEDICATIONS:  Current Outpatient Medications  Medication Sig Dispense Refill   aspirin 81 MG chewable tablet Chew by mouth.     cyanocobalamin (VITAMIN B12) 1000 MCG tablet Take 1,000 mcg by mouth daily.     allopurinol (ZYLOPRIM) 300 MG tablet Take 1 tablet (300 mg total) by mouth daily. 30 tablet 5   colchicine 0.6 MG tablet One by mouth three times a day for five days for gout pain. 15 tablet 3   escitalopram (LEXAPRO) 20 MG tablet Take 1 tablet (20 mg total) by mouth daily. 90 tablet 3   ibuprofen (ADVIL,MOTRIN) 200 MG tablet Take 200 mg by mouth every 6 (six) hours as needed.     lansoprazole (PREVACID) 15 MG capsule Take 15 mg by mouth daily.     loratadine (CLARITIN) 10 MG tablet Take 10 mg  by mouth daily.     LORazepam (ATIVAN) 1 MG tablet Take 1 tablet (1 mg total) by mouth 2 (two) times daily as needed for anxiety. 20 tablet 0   rosuvastatin (CRESTOR) 10 MG tablet Take 10 mg by  mouth daily.     No current facility-administered medications for this visit.    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  No additional areas of abnormalities elsewhere.     05/20/2012 Imaging   Repeat CT scan of the sinuses show complete response to treatment   03/03/2013 Imaging   CT scan of the chest showed fracture of the left seventh rib represents a pathologic fracture.  There is a destructive soft tissue mass at the site of the fracture worrisome for malignancy.    03/10/2013 Bone Marrow Biopsy   Repeat bone marrow biopsy again initial 2% plasma cell   03/17/2013 Imaging   Repeat PET scan showed numerous lytic myelomatous bone lesions involving the spine, left ribs and left iliac bone. There were also two pancreatic lesions and possibly three liver lesions suspicious for metastatic disease.     03/21/2013 Procedure   Biopsy of the rib lesion came back plasmacytoma   03/24/2013 Procedure   Fine-needle aspirate and biopsy of the pancreatic mass confirmed plasma cell    06/09/2013 - 08/09/2013 Chemotherapy   The patient completed 4 months of induction chemotherapy with Revlimid, Velcade and dexamethasone along with Zometa   08/21/2013 Imaging   Repeat PET scan at [facility] show persistent lytic lesions but  there were no abnormalities in the pancreas or liver   09/28/2013 Bone Marrow Transplant   The patient underwent autologous stem cell rescue after conditioning therapy with melphalan   02/26/2015 Miscellaneous   Established oncology care at  [facility]   02/26/2015 Miscellaneous   Non-complicance with appointments: No show on: 03/29/2015, 04/03/2015, 04/25/2015.     PHYSICAL EXAMINATION: ECOG PERFORMANCE STATUS: 0 - Asymptomatic  Vitals:   06/23/22 1101  BP: 121/75  Pulse: 85  Resp: 18  Temp: 98.5 F (36.9 C)  SpO2: 100%   Filed Weights   06/23/22 1101  Weight: 251 lb 3.2 oz (113.9 kg)    GENERAL:alert, no distress and comfortable NEURO: alert & oriented x 3 with fluent speech, no focal motor/sensory deficits  LABORATORY DATA:  I have reviewed the data as listed    Component Value Date/Time   NA 136 06/16/2022 1102   NA 138 06/03/2017 1336   K 3.6 06/16/2022 1102   K 4.0 06/03/2017 1336   CL 103 06/16/2022 1102   CL 105 03/30/2013 1340   CO2 28 06/16/2022 1102   CO2 27 06/03/2017 1336   GLUCOSE 99 06/16/2022 1102   GLUCOSE 107  06/03/2017 1336   GLUCOSE 120 (H) 03/30/2013 1340   BUN 12 06/16/2022 1102   BUN 10.5 06/03/2017 1336   CREATININE 1.53 (H) 06/16/2022 1102   CREATININE 1.2 06/03/2017 1336   CALCIUM 9.2 06/16/2022 1102   CALCIUM 9.5 06/03/2017 1336   PROT 6.7 06/16/2022 1102   PROT 7.1 06/03/2017 1336   PROT 6.6 06/03/2017 1336   ALBUMIN 4.0 06/16/2022 1102   ALBUMIN 3.8 06/03/2017 1336   AST 22 06/16/2022 1102   AST 32 06/03/2017 1336   ALT 18 06/16/2022 1102   ALT 38 06/03/2017 1336   ALKPHOS 59 06/16/2022 1102   ALKPHOS 80 06/03/2017 1336   BILITOT 0.7 06/16/2022 1102   BILITOT 0.75 06/03/2017 1336   GFRNONAA 52 (L) 06/16/2022 1102   GFRAA >60 06/18/2020 1318    No results found for: "SPEP", "UPEP"  Lab Results  Component Value Date   WBC 3.3 (L) 06/16/2022   NEUTROABS 2.1 06/16/2022   HGB 11.7 (L) 06/16/2022   HCT 32.5  (L) 06/16/2022   MCV 89.0 06/16/2022   PLT 110 (L) 06/16/2022      Chemistry      Component Value Date/Time   NA 136 06/16/2022 1102   NA 138 06/03/2017 1336   K 3.6 06/16/2022 1102   K 4.0 06/03/2017 1336   CL 103 06/16/2022 1102   CL 105 03/30/2013 1340   CO2 28 06/16/2022 1102   CO2 27 06/03/2017 1336   BUN 12 06/16/2022 1102   BUN 10.5 06/03/2017 1336   CREATININE 1.53 (H) 06/16/2022 1102   CREATININE 1.2 06/03/2017 1336      Component Value Date/Time   CALCIUM 9.2 06/16/2022 1102   CALCIUM 9.5 06/03/2017 1336   ALKPHOS 59 06/16/2022 1102   ALKPHOS 80 06/03/2017 1336   AST 22 06/16/2022 1102   AST 32 06/03/2017 1336   ALT 18 06/16/2022 1102   ALT 38 06/03/2017 1336   BILITOT 0.7 06/16/2022 1102   BILITOT 0.75 06/03/2017 1336

## 2022-06-23 NOTE — Assessment & Plan Note (Signed)
He has borderline B12 deficiency causing pancytopenia I recommend oral vitamin B12 supplement daily I plan to recheck again next year

## 2022-06-23 NOTE — Assessment & Plan Note (Signed)
I reviewed myeloma panel with the patient Currently, he has no signs of disease recurrence His pancytopenia is unlikely to be related He declined returning back to [facility] for post transplant follow-up From the myeloma standpoint, I plan to see him once a year with history, physical examination and blood work The patient is educated to watch out for signs and symptoms of cancer recurrence

## 2022-07-23 DIAGNOSIS — H5712 Ocular pain, left eye: Secondary | ICD-10-CM | POA: Diagnosis not present

## 2022-07-23 DIAGNOSIS — H3412 Central retinal artery occlusion, left eye: Secondary | ICD-10-CM | POA: Diagnosis not present

## 2022-07-23 DIAGNOSIS — H5440 Blindness, one eye, unspecified eye: Secondary | ICD-10-CM | POA: Diagnosis not present

## 2022-07-23 DIAGNOSIS — H4052X3 Glaucoma secondary to other eye disorders, left eye, severe stage: Secondary | ICD-10-CM | POA: Diagnosis not present

## 2022-08-19 DIAGNOSIS — H5712 Ocular pain, left eye: Secondary | ICD-10-CM | POA: Diagnosis not present

## 2022-08-19 DIAGNOSIS — H5440 Blindness, one eye, unspecified eye: Secondary | ICD-10-CM | POA: Diagnosis not present

## 2022-08-25 DIAGNOSIS — H4720 Unspecified optic atrophy: Secondary | ICD-10-CM | POA: Diagnosis not present

## 2022-08-25 DIAGNOSIS — H5462 Unqualified visual loss, left eye, normal vision right eye: Secondary | ICD-10-CM | POA: Diagnosis not present

## 2022-08-25 DIAGNOSIS — H26412 Soemmering's ring, left eye: Secondary | ICD-10-CM | POA: Diagnosis not present

## 2022-08-25 DIAGNOSIS — H5442A5 Blindness left eye category 5, normal vision right eye: Secondary | ICD-10-CM | POA: Diagnosis not present

## 2022-08-25 DIAGNOSIS — E538 Deficiency of other specified B group vitamins: Secondary | ICD-10-CM | POA: Diagnosis not present

## 2022-08-25 DIAGNOSIS — F1010 Alcohol abuse, uncomplicated: Secondary | ICD-10-CM | POA: Diagnosis not present

## 2022-08-25 DIAGNOSIS — H4089 Other specified glaucoma: Secondary | ICD-10-CM | POA: Diagnosis not present

## 2022-08-25 DIAGNOSIS — H47092 Other disorders of optic nerve, not elsewhere classified, left eye: Secondary | ICD-10-CM | POA: Diagnosis not present

## 2022-08-25 DIAGNOSIS — Z961 Presence of intraocular lens: Secondary | ICD-10-CM | POA: Diagnosis not present

## 2022-08-25 DIAGNOSIS — H5440 Blindness, one eye, unspecified eye: Secondary | ICD-10-CM | POA: Diagnosis not present

## 2022-08-25 DIAGNOSIS — K219 Gastro-esophageal reflux disease without esophagitis: Secondary | ICD-10-CM | POA: Diagnosis not present

## 2022-08-25 DIAGNOSIS — N183 Chronic kidney disease, stage 3 unspecified: Secondary | ICD-10-CM | POA: Diagnosis not present

## 2022-08-25 DIAGNOSIS — H5712 Ocular pain, left eye: Secondary | ICD-10-CM | POA: Diagnosis not present

## 2022-08-25 DIAGNOSIS — H211X2 Other vascular disorders of iris and ciliary body, left eye: Secondary | ICD-10-CM | POA: Diagnosis not present

## 2022-08-25 DIAGNOSIS — H3589 Other specified retinal disorders: Secondary | ICD-10-CM | POA: Diagnosis not present

## 2022-08-25 DIAGNOSIS — I1310 Hypertensive heart and chronic kidney disease without heart failure, with stage 1 through stage 4 chronic kidney disease, or unspecified chronic kidney disease: Secondary | ICD-10-CM | POA: Diagnosis not present

## 2022-08-25 DIAGNOSIS — H18892 Other specified disorders of cornea, left eye: Secondary | ICD-10-CM | POA: Diagnosis not present

## 2022-08-25 DIAGNOSIS — H3412 Central retinal artery occlusion, left eye: Secondary | ICD-10-CM | POA: Diagnosis not present

## 2022-08-25 DIAGNOSIS — C9000 Multiple myeloma not having achieved remission: Secondary | ICD-10-CM | POA: Diagnosis not present

## 2022-08-25 DIAGNOSIS — H2189 Other specified disorders of iris and ciliary body: Secondary | ICD-10-CM | POA: Diagnosis not present

## 2022-10-02 DIAGNOSIS — Z4420 Encounter for fitting and adjustment of artificial eye, unspecified: Secondary | ICD-10-CM | POA: Diagnosis not present

## 2022-11-06 DIAGNOSIS — Z6829 Body mass index (BMI) 29.0-29.9, adult: Secondary | ICD-10-CM | POA: Diagnosis not present

## 2022-11-06 DIAGNOSIS — F419 Anxiety disorder, unspecified: Secondary | ICD-10-CM | POA: Diagnosis not present

## 2022-11-06 DIAGNOSIS — E663 Overweight: Secondary | ICD-10-CM | POA: Diagnosis not present

## 2022-12-10 ENCOUNTER — Encounter: Payer: Self-pay | Admitting: Radiology

## 2022-12-21 DIAGNOSIS — Z6829 Body mass index (BMI) 29.0-29.9, adult: Secondary | ICD-10-CM | POA: Diagnosis not present

## 2022-12-21 DIAGNOSIS — H409 Unspecified glaucoma: Secondary | ICD-10-CM | POA: Diagnosis not present

## 2022-12-21 DIAGNOSIS — C9001 Multiple myeloma in remission: Secondary | ICD-10-CM | POA: Diagnosis not present

## 2022-12-21 DIAGNOSIS — H3582 Retinal ischemia: Secondary | ICD-10-CM | POA: Diagnosis not present

## 2022-12-21 DIAGNOSIS — E663 Overweight: Secondary | ICD-10-CM | POA: Diagnosis not present

## 2022-12-24 DIAGNOSIS — Z9889 Other specified postprocedural states: Secondary | ICD-10-CM | POA: Diagnosis not present

## 2022-12-24 DIAGNOSIS — Q111 Other anophthalmos: Secondary | ICD-10-CM | POA: Diagnosis not present

## 2022-12-24 DIAGNOSIS — Z961 Presence of intraocular lens: Secondary | ICD-10-CM | POA: Diagnosis not present

## 2023-02-11 DIAGNOSIS — Q111 Other anophthalmos: Secondary | ICD-10-CM | POA: Diagnosis not present

## 2023-02-11 DIAGNOSIS — Z961 Presence of intraocular lens: Secondary | ICD-10-CM | POA: Diagnosis not present

## 2023-06-08 DIAGNOSIS — N529 Male erectile dysfunction, unspecified: Secondary | ICD-10-CM | POA: Diagnosis not present

## 2023-06-11 ENCOUNTER — Inpatient Hospital Stay: Payer: BC Managed Care – PPO | Attending: Family Medicine

## 2023-06-17 ENCOUNTER — Other Ambulatory Visit: Payer: Self-pay | Admitting: Urology

## 2023-06-23 ENCOUNTER — Telehealth: Payer: Self-pay | Admitting: Hematology and Oncology

## 2023-06-23 DIAGNOSIS — E663 Overweight: Secondary | ICD-10-CM | POA: Diagnosis not present

## 2023-06-23 DIAGNOSIS — F419 Anxiety disorder, unspecified: Secondary | ICD-10-CM | POA: Diagnosis not present

## 2023-06-23 DIAGNOSIS — Z6828 Body mass index (BMI) 28.0-28.9, adult: Secondary | ICD-10-CM | POA: Diagnosis not present

## 2023-06-23 DIAGNOSIS — I10 Essential (primary) hypertension: Secondary | ICD-10-CM | POA: Diagnosis not present

## 2023-06-23 DIAGNOSIS — E782 Mixed hyperlipidemia: Secondary | ICD-10-CM | POA: Diagnosis not present

## 2023-06-23 DIAGNOSIS — Z01818 Encounter for other preprocedural examination: Secondary | ICD-10-CM | POA: Diagnosis not present

## 2023-06-23 DIAGNOSIS — H3582 Retinal ischemia: Secondary | ICD-10-CM | POA: Diagnosis not present

## 2023-06-23 DIAGNOSIS — H409 Unspecified glaucoma: Secondary | ICD-10-CM | POA: Diagnosis not present

## 2023-06-23 DIAGNOSIS — C9001 Multiple myeloma in remission: Secondary | ICD-10-CM | POA: Diagnosis not present

## 2023-06-23 NOTE — Telephone Encounter (Signed)
-----   Message from Artis Delay sent at 06/23/2023  3:26 PM EDT ----- Patient no show for labs Please reschedule his appt tomorrow Need labs to be done 10 days prior to appt

## 2023-06-23 NOTE — Telephone Encounter (Signed)
Pt informed appointment was canceled for tomorrow because of missed lab appointment. Pt informed he will receive a call with rescheduled lab and visit with Dr. Bertis Ruddy. Message sent to scheduling to reschedule.

## 2023-06-24 ENCOUNTER — Inpatient Hospital Stay: Payer: BC Managed Care – PPO | Admitting: Hematology and Oncology

## 2023-06-25 ENCOUNTER — Telehealth: Payer: Self-pay | Admitting: Hematology and Oncology

## 2023-06-25 NOTE — Telephone Encounter (Signed)
Left patient message regarding rescheduled appointment times/appointment dates

## 2023-07-15 DIAGNOSIS — R7309 Other abnormal glucose: Secondary | ICD-10-CM | POA: Diagnosis not present

## 2023-07-15 DIAGNOSIS — Z01818 Encounter for other preprocedural examination: Secondary | ICD-10-CM | POA: Diagnosis not present

## 2023-07-15 DIAGNOSIS — I10 Essential (primary) hypertension: Secondary | ICD-10-CM | POA: Diagnosis not present

## 2023-07-15 DIAGNOSIS — E7849 Other hyperlipidemia: Secondary | ICD-10-CM | POA: Diagnosis not present

## 2023-07-19 NOTE — Patient Instructions (Signed)
DUE TO COVID-19 ONLY TWO VISITORS  (aged 60 and older)  ARE ALLOWED TO COME WITH YOU AND STAY IN THE WAITING ROOM ONLY DURING PRE OP AND PROCEDURE.   **NO VISITORS ARE ALLOWED IN THE SHORT STAY AREA OR RECOVERY ROOM!!**  IF YOU WILL BE ADMITTED INTO THE HOSPITAL YOU ARE ALLOWED ONLY FOUR SUPPORT PEOPLE DURING VISITATION HOURS ONLY (7 AM -8PM)   The support person(s) must pass our screening, gel in and out, and wear a mask at all times, including in the patient's room. Patients must also wear a mask when staff or their support person are in the room. Visitors GUEST BADGE MUST BE WORN VISIBLY  One adult visitor may remain with you overnight and MUST be in the room by 8 P.M.     Your procedure is scheduled on: 08/03/23   Report to [facility] Main Entrance    Report to admitting at : 10:00 AM   Call this number if you have problems the morning of surgery [phone]   Do not eat food :After Midnight.   After Midnight you may have the following liquids until: 9:00 AM DAY OF SURGERY  Water Black Coffee (sugar ok, NO MILK/CREAM OR CREAMERS)  Tea (sugar ok, NO MILK/CREAM OR CREAMERS) regular and decaf                             Plain Jell-O (NO RED)                                           Fruit ices (not with fruit pulp, NO RED)                                     Popsicles (NO RED)                                                                  Juice: apple, WHITE grape, WHITE cranberry Sports drinks like Gatorade (NO RED)             FOLLOW ANY ADDITIONAL PRE OP INSTRUCTIONS YOU RECEIVED FROM YOUR SURGEON'S OFFICE!!!   Oral Hygiene is also important to reduce your risk of infection.                                    Remember - BRUSH YOUR TEETH THE MORNING OF SURGERY WITH YOUR REGULAR TOOTHPASTE  DENTURES WILL BE REMOVED PRIOR TO SURGERY PLEASE DO NOT APPLY "Poly grip" OR ADHESIVES!!!   Do NOT smoke after Midnight   Take these medicines the morning of surgery with A  SIP OF WATER: escitalopram,loratadine,allopurinol,lansoprazole. Lorazepam,colchicine as needed.                              You may not have any metal on your body including hair pins, jewelry, and body piercing  Do not wear lotions, powders, perfumes/cologne, or deodorant              Men may shave face and neck.   Do not bring valuables to the hospital. [place] IS NOT             RESPONSIBLE   FOR VALUABLES.   Contacts, glasses, or bridgework may not be worn into surgery.   Bring small overnight bag day of surgery.   DO NOT BRING YOUR HOME MEDICATIONS TO THE HOSPITAL. PHARMACY WILL DISPENSE MEDICATIONS LISTED ON YOUR MEDICATION LIST TO YOU DURING YOUR ADMISSION IN THE HOSPITAL!    Patients discharged on the day of surgery will not be allowed to drive home.  Someone NEEDS to stay with you for the first 24 hours after anesthesia.   Special Instructions: Bring a copy of your healthcare power of attorney and living will documents         the day of surgery if you haven't scanned them before.              Please read over the following fact sheets you were given: IF YOU HAVE QUESTIONS ABOUT YOUR PRE-OP INSTRUCTIONS PLEASE CALL [phone]    [facility] - Preparing for Surgery Before surgery, you can play an important role.  Because skin is not sterile, your skin needs to be as free of germs as possible.  You can reduce the number of germs on your skin by washing with CHG (chlorahexidine gluconate) soap before surgery.  CHG is an antiseptic cleaner which kills germs and bonds with the skin to continue killing germs even after washing. Please DO NOT use if you have an allergy to CHG or antibacterial soaps.  If your skin becomes reddened/irritated stop using the CHG and inform your nurse when you arrive at Short Stay. Do not shave (including legs and underarms) for at least 48 hours prior to the first CHG shower.  You may shave your face/neck. Please follow these instructions  carefully:  1.  Shower with CHG Soap the night before surgery and the  morning of Surgery.  2.  If you choose to wash your hair, wash your hair first as usual with your  normal  shampoo.  3.  After you shampoo, rinse your hair and body thoroughly to remove the  shampoo.                           4.  Use CHG as you would any other liquid soap.  You can apply chg directly  to the skin and wash                       Gently with a scrungie or clean washcloth.  5.  Apply the CHG Soap to your body ONLY FROM THE NECK DOWN.   Do not use on face/ open                           Wound or open sores. Avoid contact with eyes, ears mouth and genitals (private parts).                       Wash face,  Genitals (private parts) with your normal soap.             6.  Wash thoroughly, paying special attention to the area where your surgery  will be performed.  7.  Thoroughly rinse your body with warm water from the neck down.  8.  DO NOT shower/wash with your normal soap after using and rinsing off  the CHG Soap.                9.  Pat yourself dry with a clean towel.            10.  Wear clean pajamas.            11.  Place clean sheets on your bed the night of your first shower and do not  sleep with pets. Day of Surgery : Do not apply any lotions/deodorants the morning of surgery.  Please wear clean clothes to the hospital/surgery center.  FAILURE TO FOLLOW THESE INSTRUCTIONS MAY RESULT IN THE CANCELLATION OF YOUR SURGERY PATIENT SIGNATURE_________________________________  NURSE SIGNATURE__________________________________  ________________________________________________________________________

## 2023-07-21 ENCOUNTER — Inpatient Hospital Stay (HOSPITAL_COMMUNITY): Admission: RE | Admit: 2023-07-21 | Payer: BC Managed Care – PPO | Source: Ambulatory Visit

## 2023-07-21 NOTE — Progress Notes (Signed)
COVID Vaccine Completed: yes  Date of COVID positive in last 90 days:  PCP - Assunta Found, MD Cardiologist - Eddie Candle, MD Oncologist- Artis Delay, MD  Chest x-ray -  EKG -  Stress Test -  ECHO - 11/20/18 Epic Cardiac Cath -  Pacemaker/ICD device last checked: Spinal Cord Stimulator:  Bowel Prep -   Sleep Study -  CPAP -   Fasting Blood Sugar -  Checks Blood Sugar _____ times a day  Last dose of GLP1 agonist-  N/A GLP1 instructions:  N/A   Last dose of SGLT-2 inhibitors-  N/A SGLT-2 instructions: N/A   Blood Thinner Instructions:  Time Aspirin Instructions: Last Dose:  Activity level:  Can go up a flight of stairs and perform activities of daily living without stopping and without symptoms of chest pain or shortness of breath.  Able to exercise without symptoms  Unable to go up a flight of stairs without symptoms of     Anesthesia review: HTN, TIA, thrombocytopenia, syncope, multiple myeloma   Patient denies shortness of breath, fever, cough and chest pain at PAT appointment  Patient verbalized understanding of instructions that were given to them at the PAT appointment. Patient was also instructed that they will need to review over the PAT instructions again at home before surgery.

## 2023-07-22 ENCOUNTER — Encounter (HOSPITAL_COMMUNITY): Admission: RE | Admit: 2023-07-22 | Payer: BC Managed Care – PPO | Source: Ambulatory Visit

## 2023-07-27 ENCOUNTER — Inpatient Hospital Stay: Payer: BC Managed Care – PPO | Attending: Family Medicine

## 2023-07-27 NOTE — Patient Instructions (Addendum)
SURGICAL WAITING ROOM VISITATION Patients having surgery or a procedure may have no more than 2 support people in the waiting area - these visitors may rotate.    Children under the age of 7 must have an adult with them who is not the patient.  If the patient needs to stay at the hospital during part of their recovery, the visitor guidelines for inpatient rooms apply. Pre-op nurse will coordinate an appropriate time for 1 support person to accompany patient in pre-op.  This support person may not rotate.    Please refer to the [facility] website for the visitor guidelines for Inpatients (after your surgery is over and you are in a regular room).       Your procedure is scheduled on: 08-03-23   Report to [facility] Main Entrance    Report to admitting at 10:00 AM   Call this number if you have problems the morning of surgery [phone]   Do not eat food or drink liquids:After Midnight.            If you have questions, please contact your surgeon's office.   FOLLOW ANY ADDITIONAL PRE OP INSTRUCTIONS YOU RECEIVED FROM YOUR SURGEON'S OFFICE!!!     Oral Hygiene is also important to reduce your risk of infection.                                    Remember - BRUSH YOUR TEETH THE MORNING OF SURGERY WITH YOUR REGULAR TOOTHPASTE   Take these medicines the morning of surgery with A SIP OF WATER:   Lansoprazole  Lorazepam   Stop all vitamins and herbal supplements 7 days before surgery  Bring CPAP mask and tubing day of surgery.                              You may not have any metal on your body including  jewelry, and body piercing             Do not wear lotions, powders, cologne, or deodorant              Men may shave face and neck.   Do not bring valuables to the hospital. [place] IS NOT RESPONSIBLE   FOR VALUABLES.   Contacts, dentures or bridgework may not be worn into surgery.   Bring small overnight bag day of surgery.   DO NOT BRING YOUR HOME  MEDICATIONS TO THE HOSPITAL. PHARMACY WILL DISPENSE MEDICATIONS LISTED ON YOUR MEDICATION LIST TO YOU DURING YOUR ADMISSION IN THE HOSPITAL!     Special Instructions: Bring a copy of your healthcare power of attorney and living will documents the day of surgery if you haven't scanned them before.              Please read over the following fact sheets you were given: IF YOU HAVE QUESTIONS ABOUT YOUR PRE-OP INSTRUCTIONS PLEASE CALL [phone] [place]y [place]  If you received a COVID test during your pre-op visit  it is requested that you wear a mask when out in public, stay away from anyone that may not be feeling well and notify your surgeon if you develop symptoms. If you test positive for Covid or have been in contact with anyone that has tested positive in the last 10 days please notify you surgeon.  [place] - Preparing for Surgery  Before surgery, you can play an important role.  Because skin is not sterile, your skin needs to be as free of germs as possible.  You can reduce the number of germs on your skin by washing with CHG (chlorahexidine gluconate) soap before surgery.  CHG is an antiseptic cleaner which kills germs and bonds with the skin to continue killing germs even after washing. Please DO NOT use if you have an allergy to CHG or antibacterial soaps.  If your skin becomes reddened/irritated stop using the CHG and inform your nurse when you arrive at Short Stay. Do not shave (including legs and underarms) for at least 48 hours prior to the first CHG shower.  You may shave your face/neck.  Please follow these instructions carefully:  1.  Shower with CHG Soap the night before surgery and the  morning of surgery.  2.  If you choose to wash your hair, wash your hair first as usual with your normal  shampoo.  3.  After you shampoo, rinse your hair and body thoroughly to remove the shampoo.                             4.  Use CHG as you would any other liquid soap.  You can apply chg directly to  the skin and wash.  Gently with a scrungie or clean washcloth.  5.  Apply the CHG Soap to your body ONLY FROM THE NECK DOWN.   Do   not use on face/ open                           Wound or open sores. Avoid contact with eyes, ears mouth and   genitals (private parts).                       Wash face,  Genitals (private parts) with your normal soap.             6.  Wash thoroughly, paying special attention to the area where your    surgery  will be performed.  7.  Thoroughly rinse your body with warm water from the neck down.  8.  DO NOT shower/wash with your normal soap after using and rinsing off the CHG Soap.                9.  Pat yourself dry with a clean towel.            10.  Wear clean pajamas.            11.  Place clean sheets on your bed the night of your first shower and do not  sleep with pets. Day of Surgery : Do not apply any lotions/deodorants the morning of surgery.  Please wear clean clothes to the hospital/surgery center.  FAILURE TO FOLLOW THESE INSTRUCTIONS MAY RESULT IN THE CANCELLATION OF YOUR SURGERY  PATIENT SIGNATURE_________________________________  NURSE SIGNATURE__________________________________  ________________________________________________________________________

## 2023-07-29 ENCOUNTER — Encounter (HOSPITAL_COMMUNITY)
Admission: RE | Admit: 2023-07-29 | Discharge: 2023-07-29 | Disposition: A | Discharge status: Home / Self Care | Payer: BC Managed Care – PPO | Source: Ambulatory Visit | Attending: Urology | Admitting: Urology

## 2023-07-29 ENCOUNTER — Other Ambulatory Visit: Payer: Self-pay

## 2023-07-29 ENCOUNTER — Encounter (HOSPITAL_COMMUNITY): Payer: Self-pay

## 2023-07-29 DIAGNOSIS — I10 Essential (primary) hypertension: Secondary | ICD-10-CM | POA: Insufficient documentation

## 2023-07-29 DIAGNOSIS — Z01818 Encounter for other preprocedural examination: Secondary | ICD-10-CM | POA: Insufficient documentation

## 2023-07-29 DIAGNOSIS — Z1389 Encounter for screening for other disorder: Secondary | ICD-10-CM | POA: Diagnosis not present

## 2023-07-29 DIAGNOSIS — Z01812 Encounter for preprocedural laboratory examination: Secondary | ICD-10-CM | POA: Diagnosis not present

## 2023-07-29 DIAGNOSIS — Z01810 Encounter for preprocedural cardiovascular examination: Secondary | ICD-10-CM | POA: Diagnosis not present

## 2023-07-29 NOTE — Progress Notes (Addendum)
COVID Vaccine received:  [x]  No []  Yes Date of any COVID positive Test in last 90 days:  PCP - Dorthey Sawyer MD Hemotologist  Garth Bigness MD Oncologist- Artis Delay MD  Chest x-ray -  EKG -  07/29/23 Epic Stress Test -  ECHO - 10/20/18 Epic Cardiac Cath -   Bowel Prep - [x]  No  []   Yes ______  Pacemaker / ICD device [x]  No []  Yes   Spinal Cord Stimulator:[x]  No []  Yes       History of Sleep Apnea? [x]  No []  Yes   CPAP used?- []  No []  Yes    Does the patient monitor blood sugar?          [x]  No []  Yes  []  N/A  Patient has: [x]  NO Hx DM   []  Pre-DM                 []  DM1  []   DM2 Does patient have a [employer] or [employer]? []  No []  Yes   Fasting Blood Sugar Ranges-  Checks Blood Sugar _____ times a day  GLP1 agonist / usual dose - no GLP1 instructions:  SGLT-2 inhibitors / usual dose - no SGLT-2 instructions:   Blood Thinner / Instructions:no Aspirin Instructions:no  Comments:   Activity level: Patient is able to climb a flight of stairs without difficulty; [x]  No CP  [x]  No SOB,   Patient can  perform ADLs without assistance.   Anesthesia review: HTN, TIA's  Patient denies shortness of breath, fever, cough and chest pain at PAT appointment.  Patient verbalized understanding and agreement to the Pre-Surgical Instructions that were given to them at this PAT appointment. Patient was also educated of the need to review these PAT instructions again prior to his/her surgery.I reviewed the appropriate phone numbers to call if they have any and questions or concerns.

## 2023-07-30 LAB — URINE CULTURE: Culture: NO GROWTH

## 2023-07-30 NOTE — Progress Notes (Signed)
Case: [id number] Date/Time: 08/03/23 1200   Procedure: INSERTION OF INFLATABLE PENILE PROTHESIS - 105 MINUTES NEEDED FOR CASE   Anesthesia type: General   Pre-op diagnosis: ERECTILE DYSFUNCTION   Location: [facility] ROOM 03 / [facility]   Surgeons: Despina Arias, MD       DISCUSSION: Austin Griffith is a 62 yo male who presents to PAT prior to surgery above. PMH of HTN, MM with mets to ethmoid sinus, bone, pancreas and liver s/p BMT, chemo, and radiation in 2014 (currently in remission), glaucoma, CRAO, enucleation of the left eye (08/2022), Hx of CVA and TIA, GERD, depression, anxiety.  Patient follows with Oncology for hx of MM, now in remission. Last seen 06/23/2022. Advised to f/u in 1 year.  Patient follows with Ophthalmology for hx of glaucoma, radiation retinopathy, CRVO/CRAO of the L eye. He is now s/p L eye enucleation.   Patient follows with PCP for medical management. Unable to view records from last progress note in Epic but labs obtained from 07/15/23 visit and show normal WBC and Hgb. Platelets are mildly low (135). Creatinine is 1.5 (GFR 53). Electrolytes are WNL. Paper copy of results are in chart.   VS: BP (!) 133/100 Comment: Did not take B/P med this morning but will when he goes home per pt.  Pulse 93   Temp 36.9 C (Oral)   Resp 16   Ht 6\' 5"  (1.956 m)   Wt 110.7 kg   SpO2 100%   BMI 28.93 kg/m   PROVIDERS: PCP - Assunta Found, MD Cardiologist - Eddie Candle, MD Oncologist- Artis Delay, MD  LABS: Collect DOS (all labs ordered are listed, but only abnormal results are displayed)  Labs Reviewed  URINE CULTURE     IMAGES:   EKG 07/29/23:  Normal sinus rhythm Nonspecific T wave abnormality   CV:  Echo 08/02/21 NORMAL LEFT VENTRICULAR SYSTOLIC FUNCTION WITH MILD LVH. EF>55% NORMAL LA PRESSURES WITH NORMAL DIASTOLIC FUNCTION NORMAL RIGHT VENTRICULAR SYSTOLIC FUNCTION VALVULAR REGURGITATION: TRIVIAL MR, TRIVIAL TR NO VALVULAR STENOSIS NEGATIVE SALINE  CONTRAST STUDY   Past Medical History:  Diagnosis Date   Acid reflux    Anxiety 07/05/2013   AVN (avascular necrosis of bone) (HCC)    bilateral hips; s/p hip replacement in 1991 and 2009   Blurred vision    Present for "many years"   Bone metastases 03/31/13    MR Abdomen -Lower Thoracic and Upper Lumbar spine   Depression 11/22/2013   Epistaxis 02/08/2012   Extramedullary plasmacytoma in relapse (HCC) 04/10/2013   Fatigue    Fatigue 05/25/12   "Mild"   Headache(784.0) 02/08/2012   Right Sided Headache Behind Right Eye   History of radiation therapy 02/29/12-04/08/12   right  ethmoid sinus in 50.4Gy in 20 fxs   Hyperlipidemia    diet control    Hypertension    Knee pain 10/18/2013   Liver lesion 03/31/13   MR Abdomen   Medically noncompliant 09/29/2015   Multiple myeloma in remission (HCC) 11/22/2013   Multiple myeloma, in relapse 07/05/2013   Nausea alone 09/12/2013   Neutropenic fever (HCC) 09/18/2013   On antineoplastic chemotherapy started 04/08/13   Revlimid/ Velcade/ Dex   Pancreatic lesion 03/31/13    MR Abdomen - Ucinate Process   Plasmacytoma (HCC)     Right Ethmoid Sinus; bone marrow biopsy on 03/10/13 showed normal myeloma FISH panel and cytogenetics.    S/P radiation therapy 03/28/2013   Left posterior 7th Rib / 8Gy in 1 fraction  Shingles Feb 24, 2015   Thrombocytopenia [facility]) 05/25/12    Past Surgical History:  Procedure Laterality Date   Bilateral hip replacement  1991; and 2009   due to bilateral hip AVN's. / reports left hip was not replaced   EUS N/A 03/24/2013   Procedure: UPPER ENDOSCOPIC ULTRASOUND (EUS) LINEAR;  Surgeon: Theda Belfast, MD;  [facility];  Service: Endoscopy;  Laterality: N/A;   FINE NEEDLE ASPIRATION N/A 03/24/2013   Procedure: FINE NEEDLE ASPIRATION (FNA) LINEAR;  Surgeon: Theda Belfast, MD;  [facility];  Service: Endoscopy;  Laterality: N/A;   MASS BIOPSY  01/26/12   Right Ethmoid Mass - Plasma Cell Neoplasm     MEDICATIONS:  lansoprazole (PREVACID) 15 MG capsule   LORazepam (ATIVAN) 1 MG tablet   losartan (COZAAR) 50 MG tablet   No current facility-administered medications for this encounter.   Marcille Blanco [facility] Short Stay/Anesthesiology [facility] Phone [phone] 08/02/2023 10:18 AM

## 2023-07-30 NOTE — Plan of Care (Signed)
[facility] Tonsillectomy/Adenoidectomy, Postoperative PEDS care plan entered in error.

## 2023-08-02 ENCOUNTER — Telehealth: Payer: Self-pay

## 2023-08-02 ENCOUNTER — Encounter (HOSPITAL_COMMUNITY): Payer: Self-pay

## 2023-08-02 NOTE — Telephone Encounter (Signed)
Called regarding missing lab appt on 10/15, he said that he called to cancel the lab appt. Canceled appt on 10/25 with Dr. Bertis Ruddy due no lab appt.  He is scheduled for surgery tomorrow.  He will call back to schedule lab appt and MD appt 10 days later. Told him MD appt could be virtual visit if needed. He verbalized understanding.  FYI

## 2023-08-02 NOTE — Anesthesia Preprocedure Evaluation (Signed)
Anesthesia Evaluation  Patient identified by MRN, date of birth, ID band Patient awake    Reviewed: Allergy & Precautions, NPO status , Patient's Chart, lab work & pertinent test results  History of Anesthesia Complications Negative for: history of anesthetic complications  Airway Mallampati: II  TM Distance: >3 FB Neck ROM: Full    Dental  (+) Dental Advisory Given   Pulmonary neg pulmonary ROS   breath sounds clear to auscultation       Cardiovascular hypertension, Pt. on medications (-) angina  Rhythm:Regular Rate:Normal  '20 ECHO:  1. The left ventricle has normal systolic function of 60-65%. The cavity size was mildly increased, mildly increased left ventricular wall thickness. Echo evidence of normal diastolic relaxation.   2. The right ventricle has normal systolic function. The cavity was normal. There is no increase in right ventricular wall thickness.   3. The mitral valve is normal in structure.   4. The tricuspid valve is normal in structure.   5. The aortic valve is tricuspid There is mild sclerosis of the aortic valve.   6. The pulmonic valve was normal in structure.     Neuro/Psych  Headaches  Anxiety Depression    TIA   GI/Hepatic Neg liver ROS,GERD  Medicated and Controlled,,  Endo/Other  negative endocrine ROS    Renal/GU Renal InsufficiencyRenal disease     Musculoskeletal  (+) Arthritis ,    Abdominal   Peds  Hematology Hb 12.8, plt 119k H/o multiple myeloma   Anesthesia Other Findings H/p plasmacytoma ethmoid sinus: chemo  Reproductive/Obstetrics                             Anesthesia Physical Anesthesia Plan  ASA: 3  Anesthesia Plan: General   Post-op Pain Management: Tylenol PO (pre-op)*   Induction: Intravenous  PONV Risk Score and Plan: 2 and Ondansetron and Dexamethasone  Airway Management Planned: LMA  Additional Equipment: None  Intra-op Plan:    Post-operative Plan:   Informed Consent: I have reviewed the patients History and Physical, chart, labs and discussed the procedure including the risks, benefits and alternatives for the proposed anesthesia with the patient or authorized representative who has indicated his/her understanding and acceptance.     Dental advisory given  Plan Discussed with: CRNA and Surgeon  Anesthesia Plan Comments: (See PAT note from 10/17 by Sherlie Ban PA-C )        Anesthesia Quick Evaluation

## 2023-08-03 ENCOUNTER — Observation Stay (HOSPITAL_COMMUNITY)
Admission: RE | Admit: 2023-08-03 | Discharge: 2023-08-04 | Disposition: A | Discharge status: Home / Self Care | Payer: BC Managed Care – PPO | Attending: Urology | Admitting: Urology

## 2023-08-03 ENCOUNTER — Ambulatory Visit (HOSPITAL_COMMUNITY): Payer: Self-pay | Admitting: Physician Assistant

## 2023-08-03 ENCOUNTER — Encounter (HOSPITAL_COMMUNITY)
Admission: RE | Disposition: A | Discharge status: Home / Self Care | Payer: Self-pay | Source: Home / Self Care | Attending: Urology

## 2023-08-03 ENCOUNTER — Encounter (HOSPITAL_COMMUNITY): Payer: Self-pay | Admitting: Urology

## 2023-08-03 ENCOUNTER — Other Ambulatory Visit: Payer: Self-pay

## 2023-08-03 ENCOUNTER — Ambulatory Visit (HOSPITAL_COMMUNITY): Payer: BC Managed Care – PPO | Admitting: Anesthesiology

## 2023-08-03 DIAGNOSIS — Z85828 Personal history of other malignant neoplasm of skin: Secondary | ICD-10-CM | POA: Insufficient documentation

## 2023-08-03 DIAGNOSIS — Z79899 Other long term (current) drug therapy: Secondary | ICD-10-CM | POA: Diagnosis not present

## 2023-08-03 DIAGNOSIS — N529 Male erectile dysfunction, unspecified: Secondary | ICD-10-CM | POA: Diagnosis present

## 2023-08-03 DIAGNOSIS — Z96643 Presence of artificial hip joint, bilateral: Secondary | ICD-10-CM | POA: Insufficient documentation

## 2023-08-03 DIAGNOSIS — Z8673 Personal history of transient ischemic attack (TIA), and cerebral infarction without residual deficits: Secondary | ICD-10-CM | POA: Diagnosis not present

## 2023-08-03 DIAGNOSIS — Z960 Presence of urogenital implants: Secondary | ICD-10-CM

## 2023-08-03 DIAGNOSIS — I10 Essential (primary) hypertension: Principal | ICD-10-CM | POA: Insufficient documentation

## 2023-08-03 DIAGNOSIS — N189 Chronic kidney disease, unspecified: Secondary | ICD-10-CM | POA: Diagnosis not present

## 2023-08-03 DIAGNOSIS — I129 Hypertensive chronic kidney disease with stage 1 through stage 4 chronic kidney disease, or unspecified chronic kidney disease: Secondary | ICD-10-CM | POA: Diagnosis not present

## 2023-08-03 DIAGNOSIS — N528 Other male erectile dysfunction: Secondary | ICD-10-CM | POA: Diagnosis not present

## 2023-08-03 HISTORY — PX: PENILE PROSTHESIS IMPLANT: SHX240

## 2023-08-03 LAB — CBC
HCT: 36.8 % — ABNORMAL LOW (ref 39.0–52.0)
Hemoglobin: 12.8 g/dL — ABNORMAL LOW (ref 13.0–17.0)
MCH: 32.3 pg (ref 26.0–34.0)
MCHC: 34.8 g/dL (ref 30.0–36.0)
MCV: 92.9 fL (ref 80.0–100.0)
Platelets: 119 10*3/uL — ABNORMAL LOW (ref 150–400)
RBC: 3.96 MIL/uL — ABNORMAL LOW (ref 4.22–5.81)
RDW: 12.7 % (ref 11.5–15.5)
WBC: 4.2 10*3/uL (ref 4.0–10.5)
nRBC: 0 % (ref 0.0–0.2)

## 2023-08-03 LAB — URINALYSIS, COMPLETE (UACMP) WITH MICROSCOPIC
Bilirubin Urine: NEGATIVE
Glucose, UA: NEGATIVE mg/dL
Ketones, ur: NEGATIVE mg/dL
Leukocytes,Ua: NEGATIVE
Nitrite: NEGATIVE
Protein, ur: 100 mg/dL — AB
Specific Gravity, Urine: 1.015 (ref 1.005–1.030)
pH: 5 (ref 5.0–8.0)

## 2023-08-03 LAB — BASIC METABOLIC PANEL
Anion gap: 8 (ref 5–15)
BUN: 18 mg/dL (ref 6–20)
CO2: 23 mmol/L (ref 22–32)
Calcium: 9.1 mg/dL (ref 8.9–10.3)
Chloride: 105 mmol/L (ref 98–111)
Creatinine, Ser: 1.42 mg/dL — ABNORMAL HIGH (ref 0.61–1.24)
GFR, Estimated: 57 mL/min — ABNORMAL LOW (ref >60)
Glucose, Bld: 99 mg/dL (ref 70–99)
Potassium: 4.3 mmol/L (ref 3.5–5.1)
Sodium: 136 mmol/L (ref 135–145)

## 2023-08-03 SURGERY — INSERTION, PENILE PROSTHESIS, INFLATABLE
Anesthesia: General

## 2023-08-03 MED ORDER — PROPOFOL 10 MG/ML IV BOLUS
INTRAVENOUS | Status: AC
Start: 2023-08-03 — End: ?
  Filled 2023-08-03: qty 20

## 2023-08-03 MED ORDER — ONDANSETRON HCL 4 MG/2ML IJ SOLN
INTRAMUSCULAR | Status: DC | PRN
  Administered 2023-08-03: 4 mg via INTRAVENOUS

## 2023-08-03 MED ORDER — OXYCODONE HCL 5 MG/5ML PO SOLN: 5.0000 mg | Freq: Once | ORAL | Status: DC | PRN

## 2023-08-03 MED ORDER — BUPIVACAINE HCL 0.5 % IJ SOLN
INTRAMUSCULAR | Status: DC | PRN
  Administered 2023-08-03: 30 mL

## 2023-08-03 MED ORDER — EPHEDRINE SULFATE (PRESSORS) 50 MG/ML IJ SOLN
INTRAMUSCULAR | Status: DC | PRN
  Administered 2023-08-03 (×2): 10 mg via INTRAVENOUS
  Administered 2023-08-03: 5 mg via INTRAVENOUS

## 2023-08-03 MED ORDER — CELECOXIB 200 MG PO CAPS: 200.0000 mg | ORAL_CAPSULE | Freq: Two times a day (BID) | ORAL | 0 refills | Status: AC

## 2023-08-03 MED ORDER — ACETAMINOPHEN 500 MG PO TABS: 1000.0000 mg | ORAL_TABLET | Freq: Once | ORAL | Status: DC

## 2023-08-03 MED ORDER — ACETAMINOPHEN 500 MG PO TABS: 1000.0000 mg | ORAL_TABLET | Freq: Four times a day (QID) | ORAL | 0 refills | Status: AC

## 2023-08-03 MED ORDER — OXYCODONE HCL 5 MG PO TABS: 5.0000 mg | ORAL_TABLET | Freq: Once | ORAL | Status: DC | PRN

## 2023-08-03 MED ORDER — HYDROMORPHONE HCL 1 MG/ML IJ SOLN
0.5000 mg | INTRAMUSCULAR | Status: DC | PRN
Start: 2023-08-03 — End: 2023-08-04
  Administered 2023-08-04: 1 mg via INTRAVENOUS
  Filled 2023-08-03: qty 1

## 2023-08-03 MED ORDER — FENTANYL CITRATE (PF) 100 MCG/2ML IJ SOLN
INTRAMUSCULAR | Status: DC | PRN
  Administered 2023-08-03: 25 ug via INTRAVENOUS
  Administered 2023-08-03: 50 ug via INTRAVENOUS
  Administered 2023-08-03: 25 ug via INTRAVENOUS

## 2023-08-03 MED ORDER — VANCOMYCIN HCL 1500 MG/300ML IV SOLN
1500.0000 mg | INTRAVENOUS | Status: AC
  Administered 2023-08-03: 1500 mg via INTRAVENOUS
  Filled 2023-08-03: qty 300

## 2023-08-03 MED ORDER — MIDAZOLAM HCL 2 MG/2ML IJ SOLN
INTRAMUSCULAR | Status: AC
  Filled 2023-08-03: qty 2

## 2023-08-03 MED ORDER — MEPERIDINE HCL 50 MG/ML IJ SOLN: 6.2500 mg | INTRAMUSCULAR | Status: DC | PRN

## 2023-08-03 MED ORDER — CHLORHEXIDINE GLUCONATE 0.12 % MT SOLN
15.0000 mL | Freq: Once | OROMUCOSAL | Status: AC
  Administered 2023-08-03: 15 mL via OROMUCOSAL

## 2023-08-03 MED ORDER — EPHEDRINE 5 MG/ML INJ
INTRAVENOUS | Status: AC
  Filled 2023-08-03: qty 5

## 2023-08-03 MED ORDER — VANCOMYCIN HCL IN DEXTROSE 1-5 GM/200ML-% IV SOLN: 1000.0000 mg | Freq: Once | INTRAVENOUS | Status: DC

## 2023-08-03 MED ORDER — LACTATED RINGERS IV SOLN: INTRAVENOUS | Status: DC | PRN

## 2023-08-03 MED ORDER — LORAZEPAM 1 MG PO TABS
1.0000 mg | ORAL_TABLET | Freq: Two times a day (BID) | ORAL | Status: DC | PRN
  Administered 2023-08-03: 1 mg via ORAL
  Filled 2023-08-03: qty 1

## 2023-08-03 MED ORDER — OXYCODONE HCL 5 MG PO TABS
5.0000 mg | ORAL_TABLET | ORAL | Status: DC | PRN
  Administered 2023-08-04: 5 mg via ORAL
  Filled 2023-08-03: qty 1

## 2023-08-03 MED ORDER — BUPIVACAINE HCL (PF) 0.5 % IJ SOLN
INTRAMUSCULAR | Status: AC
  Filled 2023-08-03: qty 30

## 2023-08-03 MED ORDER — FENTANYL CITRATE (PF) 100 MCG/2ML IJ SOLN
INTRAMUSCULAR | Status: AC
  Filled 2023-08-03: qty 2

## 2023-08-03 MED ORDER — ACETAMINOPHEN 500 MG PO TABS
1000.0000 mg | ORAL_TABLET | Freq: Four times a day (QID) | ORAL | Status: DC
  Filled 2023-08-03: qty 2

## 2023-08-03 MED ORDER — PHENYLEPHRINE HCL (PRESSORS) 10 MG/ML IV SOLN
INTRAVENOUS | Status: DC | PRN
Start: 2023-08-03 — End: 2023-08-03
  Administered 2023-08-03: 160 ug via INTRAVENOUS

## 2023-08-03 MED ORDER — CHLORHEXIDINE GLUCONATE 4 % EX SOLN: Freq: Once | CUTANEOUS | Status: DC

## 2023-08-03 MED ORDER — LIDOCAINE HCL (CARDIAC) PF 100 MG/5ML IV SOSY
PREFILLED_SYRINGE | INTRAVENOUS | Status: DC | PRN
  Administered 2023-08-03: 100 mg via INTRATRACHEAL

## 2023-08-03 MED ORDER — DEXAMETHASONE SODIUM PHOSPHATE 4 MG/ML IJ SOLN
INTRAMUSCULAR | Status: DC | PRN
  Administered 2023-08-03: 4 mg via INTRAVENOUS

## 2023-08-03 MED ORDER — OXYCODONE HCL 5 MG PO TABS: 5.0000 mg | ORAL_TABLET | Freq: Four times a day (QID) | ORAL | 0 refills | Status: DC | PRN

## 2023-08-03 MED ORDER — LIDOCAINE HCL 1 % IJ SOLN
INTRAMUSCULAR | Status: DC | PRN
  Administered 2023-08-03: 20 mL

## 2023-08-03 MED ORDER — GENTAMICIN SULFATE 40 MG/ML IJ SOLN
5.0000 mg/kg | INTRAVENOUS | Status: AC
  Administered 2023-08-03: 490 mg via INTRAVENOUS
  Filled 2023-08-03: qty 12.25

## 2023-08-03 MED ORDER — GLYCOPYRROLATE 0.2 MG/ML IJ SOLN
INTRAMUSCULAR | Status: DC | PRN
  Administered 2023-08-03: .2 mg via INTRAVENOUS

## 2023-08-03 MED ORDER — SULFAMETHOXAZOLE-TRIMETHOPRIM 800-160 MG PO TABS
1.0000 | ORAL_TABLET | Freq: Two times a day (BID) | ORAL | 0 refills | Status: AC
Start: 2023-08-03 — End: 2023-08-17

## 2023-08-03 MED ORDER — LIDOCAINE HCL 1 % IJ SOLN
INTRAMUSCULAR | Status: AC
  Filled 2023-08-03: qty 20

## 2023-08-03 MED ORDER — LACTATED RINGERS IV SOLN: INTRAVENOUS | Status: DC

## 2023-08-03 MED ORDER — ONDANSETRON HCL 4 MG/2ML IJ SOLN
INTRAMUSCULAR | Status: AC
  Filled 2023-08-03: qty 2

## 2023-08-03 MED ORDER — DEXAMETHASONE SODIUM PHOSPHATE 10 MG/ML IJ SOLN
INTRAMUSCULAR | Status: AC
  Filled 2023-08-03: qty 1

## 2023-08-03 MED ORDER — SODIUM CHLORIDE 0.9 % IR SOLN
Status: DC | PRN
  Administered 2023-08-03: 1000 mL

## 2023-08-03 MED ORDER — ONDANSETRON HCL 4 MG/2ML IJ SOLN
4.0000 mg | INTRAMUSCULAR | Status: DC | PRN
  Administered 2023-08-03: 4 mg via INTRAVENOUS
  Filled 2023-08-03: qty 2

## 2023-08-03 MED ORDER — STERILE WATER FOR IRRIGATION IR SOLN
Status: DC | PRN
  Administered 2023-08-03: 500 mL

## 2023-08-03 MED ORDER — PROPOFOL 10 MG/ML IV BOLUS
INTRAVENOUS | Status: DC | PRN
  Administered 2023-08-03: 30 mg via INTRAVENOUS
  Administered 2023-08-03: 170 mg via INTRAVENOUS

## 2023-08-03 MED ORDER — MIDAZOLAM HCL 5 MG/5ML IJ SOLN
INTRAMUSCULAR | Status: DC | PRN
  Administered 2023-08-03: 2 mg via INTRAVENOUS

## 2023-08-03 MED ORDER — CELECOXIB 200 MG PO CAPS
200.0000 mg | ORAL_CAPSULE | Freq: Two times a day (BID) | ORAL | Status: DC
  Administered 2023-08-03 – 2023-08-04 (×2): 200 mg via ORAL
  Filled 2023-08-03 (×3): qty 1

## 2023-08-03 MED ORDER — ORAL CARE MOUTH RINSE: 15.0000 mL | Freq: Once | OROMUCOSAL | Status: AC

## 2023-08-03 MED ORDER — IRRISEPT - 450ML BOTTLE WITH 0.05% CHG IN STERILE WATER, USP 99.95% OPTIME
TOPICAL | Status: DC | PRN
  Administered 2023-08-03: 1350 mL

## 2023-08-03 MED ORDER — MIDAZOLAM HCL 2 MG/2ML IJ SOLN
0.5000 mg | Freq: Once | INTRAMUSCULAR | Status: DC | PRN
Start: 2023-08-03 — End: 2023-08-03

## 2023-08-03 MED ORDER — FLUCONAZOLE IN SODIUM CHLORIDE 200-0.9 MG/100ML-% IV SOLN
200.0000 mg | INTRAVENOUS | Status: AC
  Administered 2023-08-03: 200 mg via INTRAVENOUS
  Filled 2023-08-03: qty 100

## 2023-08-03 MED ORDER — HYDROMORPHONE HCL 1 MG/ML IJ SOLN: 0.2500 mg | INTRAMUSCULAR | Status: DC | PRN

## 2023-08-03 SURGICAL SUPPLY — 56 items
ADH SKN CLS APL DERMABOND .7 (GAUZE/BANDAGES/DRESSINGS) ×1
APL PRP STRL LF DISP 70% ISPRP (MISCELLANEOUS) ×2
BAG DRN RND TRDRP ANRFLXCHMBR (UROLOGICAL SUPPLIES) ×1
BAG URINE DRAIN 2000ML AR STRL (UROLOGICAL SUPPLIES) ×1 IMPLANT
BLADE SURG 15 STRL LF DISP TIS (BLADE) IMPLANT
BLADE SURG 15 STRL SS (BLADE) ×1
BNDG GAUZE DERMACEA FLUFF 4 (GAUZE/BANDAGES/DRESSINGS) ×1 IMPLANT
BNDG GZE DERMACEA 4 6PLY (GAUZE/BANDAGES/DRESSINGS) ×1
BRIEF MESH DISP LRG (UNDERPADS AND DIAPERS) ×1 IMPLANT
CATH COUDE 5CC RIBBED (CATHETERS) ×1 IMPLANT
CATH RIBBED COUDE 5CC (CATHETERS) ×1
CHLORAPREP W/TINT 26 (MISCELLANEOUS) ×2 IMPLANT
COVER MAYO STAND STRL (DRAPES) ×1 IMPLANT
COVER SURGICAL LIGHT HANDLE (MISCELLANEOUS) ×1 IMPLANT
DERMABOND ADVANCED .7 DNX12 (GAUZE/BANDAGES/DRESSINGS) ×1 IMPLANT
DRAIN CHANNEL 10F 3/8 F FF (DRAIN) IMPLANT
DRAPE INCISE IOBAN 66X45 STRL (DRAPES) ×1 IMPLANT
DRAPE LAPAROTOMY T 98X78 PEDS (DRAPES) ×1 IMPLANT
DRSG TEGADERM 4X4.75 (GAUZE/BANDAGES/DRESSINGS) ×1 IMPLANT
ELECT REM PT RETURN 15FT ADLT (MISCELLANEOUS) ×1 IMPLANT
EVACUATOR SILICONE 100CC (DRAIN) ×1 IMPLANT
GAUZE SPONGE 2X2 8PLY STRL LF (GAUZE/BANDAGES/DRESSINGS) IMPLANT
GLOVE BIO SURGEON STRL SZ7 (GLOVE) ×1 IMPLANT
GLOVE BIOGEL PI IND STRL 7.0 (GLOVE) ×1 IMPLANT
GOWN STRL REUS W/ TWL LRG LVL3 (GOWN DISPOSABLE) ×1 IMPLANT
GOWN STRL REUS W/TWL LRG LVL3 (GOWN DISPOSABLE) ×2
HOLDER FOLEY CATH W/STRAP (MISCELLANEOUS) ×1 IMPLANT
IMPL RTE SNAPCONE 0.5CM (Urological Implant) IMPLANT
IMPLANT RTE SNAPCONE 0.5CM (Urological Implant) ×1 IMPLANT
JET LAVAGE IRRISEPT WOUND (IRRIGATION / IRRIGATOR) ×3
KIT ACCESSORY AMS 700 PUMP (Urological Implant) IMPLANT
KIT BASIN OR (CUSTOM PROCEDURE TRAY) ×1 IMPLANT
KIT TURNOVER KIT A (KITS) IMPLANT
LAVAGE JET IRRISEPT WOUND (IRRIGATION / IRRIGATOR) IMPLANT
NDL HYPO 22X1.5 SAFETY MO (MISCELLANEOUS) ×1 IMPLANT
NEEDLE HYPO 22X1.5 SAFETY MO (MISCELLANEOUS) ×1 IMPLANT
NS IRRIG 1000ML POUR BTL (IV SOLUTION) ×1 IMPLANT
PACK GENERAL/GYN (CUSTOM PROCEDURE TRAY) ×1 IMPLANT
PLUG CATH AND CAP STRL 200 (CATHETERS) ×1 IMPLANT
PUMP PENILE AMS 700CX MS 12X21 (Erectile Restoration) IMPLANT
RESERVOIR FLAT IZ 100ML (Miscellaneous) IMPLANT
RETRACTOR DEEP SCROTAL PENILE (MISCELLANEOUS) IMPLANT
RETRACTOR WILSON SYSTEM (INSTRUMENTS) IMPLANT
SURGILUBE 2OZ TUBE FLIPTOP (MISCELLANEOUS) IMPLANT
SUT ETHILON 3 0 PS 1 (SUTURE) IMPLANT
SUT MNCRL AB 4-0 PS2 18 (SUTURE) ×1 IMPLANT
SUT VIC AB 2-0 UR6 27 (SUTURE) ×4 IMPLANT
SUT VIC AB 3-0 SH 27 (SUTURE) ×3
SUT VIC AB 3-0 SH 27X BRD (SUTURE) ×2 IMPLANT
SYR 10ML LL (SYRINGE) ×1 IMPLANT
SYR 20ML LL LF (SYRINGE) IMPLANT
SYR 50ML LL SCALE MARK (SYRINGE) ×3 IMPLANT
SYR CONTROL 10ML LL (SYRINGE) ×1 IMPLANT
TOWEL GREEN STERILE FF (TOWEL DISPOSABLE) ×1 IMPLANT
TOWEL OR 17X26 10 PK STRL BLUE (TOWEL DISPOSABLE) ×2 IMPLANT
WATER STERILE IRR 500ML POUR (IV SOLUTION) ×1 IMPLANT

## 2023-08-03 NOTE — Anesthesia Postprocedure Evaluation (Signed)
Anesthesia Post Note  Patient: Austin Griffith  Procedure(s) Performed: INSERTION OF INFLATABLE PENILE PROTHESIS     Patient location during evaluation: PACU Anesthesia Type: General Level of consciousness: sedated and patient cooperative Pain management: pain level controlled Vital Signs Assessment: post-procedure vital signs reviewed and stable Respiratory status: spontaneous breathing Cardiovascular status: stable Anesthetic complications: no   No notable events documented.  Last Vitals:  Vitals:   08/03/23 1534 08/03/23 1536  BP: (!) 156/96 (!) 156/96  Pulse: 76 62  Resp:  18  Temp: 36.5 C 36.5 C  SpO2: 97% 100%    Last Pain:  Vitals:   08/03/23 1536  TempSrc: Oral  PainSc:                  Lewie Loron

## 2023-08-03 NOTE — Discharge Instructions (Signed)
Penile prosthesis postoperative instructions  Wound:  In most cases your incision will have absorbable sutures that will dissolve within the first 10-20 days. Some will fall out even earlier. Expect some redness as the sutures dissolved but this should occur only around the sutures. If there is generalized redness, especially with increasing pain or swelling, let us know. The scrotum and penis will very likely get "black and blue" as the blood in the tissues spread. Sometimes the whole scrotum will turn colors. The black and blue is followed by a yellow and brown color. In time, all the discoloration will go away. In some cases some firm swelling in the area of the testicle and pump may persist for up to 4-6 weeks after the surgery and is considered normal in most cases.  Drain:   You may be discharged home with a drain in place. If so, you will be taught how to empty it and should keep track of the output. Additionally, you should call the office to arrange for an appointment to have it removed after a few days.   Diet:  You may return to your normal diet within 24 hours following your surgery. You may note some mild nausea and possibly vomiting the first 6-8 hours following surgery. This is usually due to the side effects of anesthesia, and will disappear quite soon. I would suggest clear liquids and a very light meal the first evening following your surgery.  Activity:  Your physical activity should be restricted the first 48 hours. During that time you should remain relatively inactive, moving about only when necessary. During the first 3 weeks following surgery you should avoid lifting any heavy objects (anything greater than 15 pounds), and avoid strenuous exercise. If you work, ask us specifically about your restrictions, both for work and home. We will write a note to your employer if needed.  Avoid using your penis until your follow up visit with Dr Talayeh Bruinsma, which will typically be around  3-4 weeks following the surgery. Most people are able to start cycling their device after that appointment, and can have intercourse soon thereafter.   You should plan to wear a tight pair of jockey shorts or an athletic supporter for the first 4-5 days, even to sleep. This will keep the scrotum immobilized to some degree and keep the swelling down.The position of your penis will determine what is most comfortable but I strongly urge you to keep the penis in the "up" position (toward your head). You should continue to tuck "up" your penis when possible for the first 3 months following surgery.  Ice packs should be placed on and off over the scrotum for the first 48 hours. Frozen peas or corn in a ZipLock bag can be frozen, used and re-frozen. Fifteen minutes on and 15 minutes off is a reasonable schedule. The ice is a good pain reliever and keeps the swelling down.  Hygiene:  You may shower 48 hours after your surgery. Tub bathing should be restricted until the wound is completely healed, typically around 2-3 weeks.  Medication:  You will be sent home with some type of pain medication. In many cases you will be sent home with a strong anti-inflammatory medication (Celebrex, Meloxicam) and a narcotic pain pill (hydrocodone or oxycodone). You can also supplement these medications with tylenol (acetaminophen). If the pain medication you are sent home with does not control the pain, please notify the office Problems you should report to us:  Fever of 101.0 degrees   Fahrenheit or greater. Moderate or severe swelling under the skin incision or involving the scrotum. Drug reaction such as hives, a rash, nausea or vomiting.  

## 2023-08-03 NOTE — H&P (Signed)
H&P  History of Present Illness: Austin Griffith is a 62 y.o. year old M who presents today for insertion of an inflatable penile prosthesis  No acute complaints  Past Medical History:  Diagnosis Date   Acid reflux    Anxiety 07/05/2013   AVN (avascular necrosis of bone) (HCC)    bilateral hips; s/p hip replacement in 1991 and 2009   Blurred vision    Present for "many years"   Bone metastases 03/31/13    MR Abdomen -Lower Thoracic and Upper Lumbar spine   Depression 11/22/2013   Epistaxis 02/08/2012   Extramedullary plasmacytoma in relapse (HCC) 04/10/2013   Fatigue    Fatigue 05/25/12   "Mild"   Headache(784.0) 02/08/2012   Right Sided Headache Behind Right Eye   History of radiation therapy 02/29/12-04/08/12   right  ethmoid sinus in 50.4Gy in 20 fxs   Hyperlipidemia    diet control    Hypertension    Knee pain 10/18/2013   Liver lesion 03/31/13   MR Abdomen   Medically noncompliant 09/29/2015   Multiple myeloma in remission (HCC) 11/22/2013   Multiple myeloma, in relapse 07/05/2013   Nausea alone 09/12/2013   Neutropenic fever (HCC) 09/18/2013   On antineoplastic chemotherapy started 04/08/13   Revlimid/ Velcade/ Dex   Pancreatic lesion 03/31/13    MR Abdomen - Ucinate Process   Plasmacytoma (HCC)     Right Ethmoid Sinus; bone marrow biopsy on 03/10/13 showed normal myeloma FISH panel and cytogenetics.    S/P radiation therapy 03/28/2013   Left posterior 7th Rib / 8Gy in 1 fraction   Shingles Feb 24, 2015   Thrombocytopenia (HCC) 05/25/12    Past Surgical History:  Procedure Laterality Date   Bilateral hip replacement  1991; and 2009   due to bilateral hip AVN's. / reports left hip was not replaced   EUS N/A 03/24/2013   Procedure: UPPER ENDOSCOPIC ULTRASOUND (EUS) LINEAR;  Surgeon: Theda Belfast, MD;  [facility];  Service: Endoscopy;  Laterality: N/A;   FINE NEEDLE ASPIRATION N/A 03/24/2013   Procedure: FINE NEEDLE ASPIRATION (FNA) LINEAR;  Surgeon: Theda Belfast, MD;  [facility];  Service: Endoscopy;  Laterality: N/A;   MASS BIOPSY  01/26/12   Right Ethmoid Mass - Plasma Cell Neoplasm    Home Medications:  Current Meds  Medication Sig   lansoprazole (PREVACID) 15 MG capsule Take 15 mg by mouth daily.   LORazepam (ATIVAN) 1 MG tablet Take 1 tablet (1 mg total) by mouth 2 (two) times daily as needed for anxiety. (Patient taking differently: Take 1 mg by mouth daily.)    Allergies: No Known Allergies  Family History  Adopted: Yes    Social History:  reports that he has never smoked. He has never used smokeless tobacco. He reports current alcohol use. He reports that he does not use drugs.  ROS: A complete review of systems was performed.  All systems are negative except for pertinent findings as noted.  Physical Exam:  Vital signs in last 24 hours: Temp:  [98 F (36.7 C)] 98 F (36.7 C) (10/22 1041) Pulse Rate:  [68] 68 (10/22 1041) Resp:  [18] 18 (10/22 1041) BP: (157)/(102) 157/102 (10/22 1041) SpO2:  [100 %] 100 % (10/22 1041) Weight:  [110.7 kg] 110.7 kg (10/22 1045) Constitutional:  Alert and oriented, No acute distress Cardiovascular: Regular rate and rhythm Respiratory: Normal respiratory effort, Lungs clear bilaterally GI: Abdomen is soft, nontender, nondistended, no abdominal masses Lymphatic:  No lymphadenopathy Neurologic: Grossly intact, no focal deficits Psychiatric: Normal mood and affect   Laboratory Data:  Recent Labs    08/03/23 1024  WBC 4.2  HGB 12.8*  HCT 36.8*  PLT 119*    Recent Labs    08/03/23 1024  NA 136  K 4.3  CL 105  GLUCOSE 99  BUN 18  CALCIUM 9.1  CREATININE 1.42*     Results for orders placed or performed during the hospital encounter of 08/03/23 (from the past 24 hour(s))  Basic metabolic panel per protocol     Status: Abnormal   Collection Time: 08/03/23 10:24 AM  Result Value Ref Range   Sodium 136 135 - 145 mmol/L   Potassium 4.3 3.5 - 5.1 mmol/L   Chloride  105 98 - 111 mmol/L   CO2 23 22 - 32 mmol/L   Glucose, Bld 99 70 - 99 mg/dL   BUN 18 6 - 20 mg/dL   Creatinine, Ser 1.61 (H) 0.61 - 1.24 mg/dL   Calcium 9.1 8.9 - 09.6 mg/dL   GFR, Estimated 57 (L) >60 mL/min   Anion gap 8 5 - 15  CBC per protocol     Status: Abnormal   Collection Time: 08/03/23 10:24 AM  Result Value Ref Range   WBC 4.2 4.0 - 10.5 K/uL   RBC 3.96 (L) 4.22 - 5.81 MIL/uL   Hemoglobin 12.8 (L) 13.0 - 17.0 g/dL   HCT 04.5 (L) 40.9 - 81.1 %   MCV 92.9 80.0 - 100.0 fL   MCH 32.3 26.0 - 34.0 pg   MCHC 34.8 30.0 - 36.0 g/dL   RDW 91.4 78.2 - 95.6 %   Platelets 119 (L) 150 - 400 K/uL   nRBC 0.0 0.0 - 0.2 %  Urinalysis, Complete w Microscopic -Urine, Clean Catch     Status: Abnormal   Collection Time: 08/03/23 10:25 AM  Result Value Ref Range   Color, Urine YELLOW YELLOW   APPearance CLEAR CLEAR   Specific Gravity, Urine 1.015 1.005 - 1.030   pH 5.0 5.0 - 8.0   Glucose, UA NEGATIVE NEGATIVE mg/dL   Hgb urine dipstick LARGE (A) NEGATIVE   Bilirubin Urine NEGATIVE NEGATIVE   Ketones, ur NEGATIVE NEGATIVE mg/dL   Protein, ur 213 (A) NEGATIVE mg/dL   Nitrite NEGATIVE NEGATIVE   Leukocytes,Ua NEGATIVE NEGATIVE   RBC / HPF 21-50 0 - 5 RBC/hpf   WBC, UA 0-5 0 - 5 WBC/hpf   Bacteria, UA RARE (A) NONE SEEN   Squamous Epithelial / HPF 0-5 0 - 5 /HPF   Mucus PRESENT    Hyaline Casts, UA PRESENT    Recent Results (from the past 240 hour(s))  Urine Culture     Status: None   Collection Time: 07/29/23  9:31 AM   Specimen: Urine, Clean Catch  Result Value Ref Range Status   Specimen Description   Final    URINE, CLEAN CATCH Performed at [facility], [address], [place], [place] [address]    Special Requests   Final    NONE Performed at [facility], [address], [place], [place] [address]    Culture   Final    NO GROWTH Performed at [facility], [address], [place], [place] [address]    Report Status  07/30/2023 FINAL  Final    Renal Function: Recent Labs    08/03/23 1024  CREATININE 1.42*   Estimated Creatinine Clearance: 76.4 mL/min (A) (by  C-G formula based on SCr of 1.42 mg/dL (H)).  Radiologic Imaging: No results found.  Assessment:  Austin Griffith is a 62 y.o. year old M with ED refractory to other medical treatments  Plan:  To OR as planned for IPP. Procedure and risks reviewed, including but not limited to bleeding, infection, implant infection, implant malfunction, implant malplacement, erosion, damage to adjacent structures, pain, urinary retention. All questions answered   Irine Seal, MD 08/03/2023, 11:36 AM  [facility] Pager: [phone]

## 2023-08-03 NOTE — Transfer of Care (Signed)
Immediate Anesthesia Transfer of Care Note  Patient: Austin Griffith  Procedure(s) Performed: INSERTION OF INFLATABLE PENILE PROTHESIS  Patient Location: PACU  Anesthesia Type:General  Level of Consciousness: awake and alert   Airway & Oxygen Therapy: Patient Spontanous Breathing and Patient connected to face mask oxygen  Post-op Assessment: Report given to RN and Post -op Vital signs reviewed and stable  Post vital signs: Reviewed and stable  Last Vitals:  Vitals Value Taken Time  BP 153/104 08/03/23 1408  Temp    Pulse 80 08/03/23 1413  Resp 15 08/03/23 1413  SpO2 96 % 08/03/23 1413  Vitals shown include unfiled device data.  Last Pain:  Vitals:   08/03/23 1045  TempSrc:   PainSc: 0-No pain      Patients Stated Pain Goal: 3 (08/03/23 1045)  Complications: No notable events documented.

## 2023-08-03 NOTE — Anesthesia Procedure Notes (Signed)
Procedure Name: LMA Insertion Date/Time: 08/03/2023 12:30 PM  Performed by: Deri Fuelling, CRNAPre-anesthesia Checklist: Patient identified, Emergency Drugs available, Suction available and Patient being monitored Patient Re-evaluated:Patient Re-evaluated prior to induction Oxygen Delivery Method: Circle system utilized Preoxygenation: Pre-oxygenation with 100% oxygen Induction Type: IV induction Ventilation: Mask ventilation without difficulty LMA: LMA flexible inserted LMA Size: 5.0 Tube type: Oral Number of attempts: 1 Airway Equipment and Method: Stylet and Oral airway Placement Confirmation: ETT inserted through vocal cords under direct vision, positive ETCO2 and breath sounds checked- equal and bilateral Tube secured with: Tape Dental Injury: Teeth and Oropharynx as per pre-operative assessment

## 2023-08-03 NOTE — Op Note (Signed)
PATIENT:  Austin Griffith  PRE-OPERATIVE DIAGNOSIS:  Organic erectile dysfunction  POST-OPERATIVE DIAGNOSIS:  Same  PROCEDURE:   3 piece inflatable penile prosthesis (BS/AMS) Injection of pharmacoagent into penis  SURGEON:  Irine Seal MD  ASST: Domingo Mend, MD  INDICATION: He has had long-standing organic erectile dysfunction and refractory to other modes of treatment. He has elected to proceed with prosthesis implantation.  ANESTHESIA:  General  EBL:  Minimal  Device: 3 piece AMS CX 700: 100 cc reservoir, 213 cm cylinders and 0.5 cm rear-tip extenders on right and left sides  LOCAL MEDICATIONS USED:  None  SPECIMEN: None  DISPOSITION OF SPECIMEN:  N/A  Description of procedure: The patient was taken to the major operating room, placed on the table and administered general anesthesia in the supine position. His genitalia was then prepped with chlorhexidine x 2. He was draped in the usual sterile fashion, and I used [place] on the field. An official timeout was then performed.  A dorsal penile block was performed. A butterfly needle was then used to inject normal saline into the penis to give an artificial erection. There was no clinically significant curvature or deformity. I then injected 10 cc of lidocaine/marcaine into the penis.   A 14 French coude catheter was then placed in the bladder and the bladder was drained and the catheter was plugged. A midline penoscrotal incision was then made and the dissection was carried down to the corpora and urethra. The lonestar retractor was positioned so as to have excellent exposure. 2-0 Vicryl sutures were then placed proximally in each corpus cavernosum to serve as stay sutures. An incision was then made in the corpus cavernosum first on the left-hand side with the bovie. Jen Mow were used to gently dilate the opening. I then dilated the corpus cavernosum with the a 12 Fr brooks dilator distally and proximally. Field goal post tests were  performed and there was no evidence of perforations or crossover. I then irrigated the corpus cavernosum with antibiotic solution and measured the distance proximally and distally from the stay suture and was found to be 10.5 and 11 cm, respectively.I then turned my attention to the contralateral corpus cavernosum and placed my stay sutures, made my corporotomy and dilated the corpus cavernosum in an identical fashion. This was measured and also was found to be 10.5 cm proximally and 11 distally. It was irrigated with anastomotic solution as was the scrotum. I then chose an 21 cm cylinder set with 0.5 cm rear-tip extenders and these were prepped while I prepared the site for reservoir placement.  I then digitally probed into the Left external inguinal ring. My finger was used to poke through the posterior wall of the ring. I used my finger to ensure I was in the appropriate space, and to clear room for the reservoir. I irrigated the space with anastomotic solution and then placed the reservoir in this location. I then filled the reservoir with 99 cc of sterile saline, and checked to confirm proper position. There was minimal backpressure with the reservoir max-filled.  Attention was redirected to the corporotomies where the cylinders were then placed by first fixing the suture to the distal aspect of the right cylinder to a straight needle. This was then loaded on the [facility] inserter and passed through the corporotomy and distally. I then advanced the straight needle with the [place]low inserter out through the glans and this was grasped with a hemostat and pulled through the glans and the suture was  secured with a hemostat. I then performed an identical maneuver on the contralateral side. After this was performed I irrigated both corpus cavernosum; there was no evidence of urethral perforation. I inserted the distal portion of the cylinder through the corporotomies and pulled this to the end of the corpora with the  suture. The proximal aspect with the rear-tip extender was then passed through the corporotomy and into the seated position on each side. I then connected reservoir tubing to a syringe filled with sterile saline and inflated the device. I noted a good straight erection with both cylinders equidistant under the glans and no buckling of the cylinders. I therefore deflated the device and closed the corporotomies with used my previously placed stay sutures.   I then grasped the scrotal skin in the midline with a babcock, and used a hemostat to dissect down to the dependent-most portion of the scrotum. The nasal speculum was inserted into this space, and facilitated placement of the pump. The cylinder was then connected to the pump after excising the excess tubing with appropriate shodded hemostats in place and then I used the supplied connectors to make the connection. I then again cycled the device with the pump and it cycled properly. I deflated the device and pumped it up about three quarters of the way to aid with hemostasis. I irrigated the wound one last time with antibiotic irrigation and then closed the deep scrotal tissue over the tubing and pump with running 3-0 monocryl suture. I placed a 10 Fr blake drain over the corporotomies. A second layer was then closed over this first layer with running 3- 0 monocryl, and running skin suture w/ 4-0 monocryl performed. Incision dressed with dermabond.  A mummy wrap was applied. The catheter was connected to closed system drainage, and drain connected to suction bulb and the patient was awakened and taken recovery room in stable and satisfactory condition. He tolerated the procedure well and there were no intraoperative complications. Needle sponge and instrument counts were correct at the end of the operation.

## 2023-08-04 ENCOUNTER — Encounter (HOSPITAL_COMMUNITY): Payer: Self-pay | Admitting: Urology

## 2023-08-04 DIAGNOSIS — N529 Male erectile dysfunction, unspecified: Secondary | ICD-10-CM | POA: Diagnosis not present

## 2023-08-04 DIAGNOSIS — I10 Essential (primary) hypertension: Secondary | ICD-10-CM | POA: Diagnosis not present

## 2023-08-04 DIAGNOSIS — Z8673 Personal history of transient ischemic attack (TIA), and cerebral infarction without residual deficits: Secondary | ICD-10-CM | POA: Diagnosis not present

## 2023-08-04 DIAGNOSIS — Z96643 Presence of artificial hip joint, bilateral: Secondary | ICD-10-CM | POA: Diagnosis not present

## 2023-08-04 DIAGNOSIS — Z79899 Other long term (current) drug therapy: Secondary | ICD-10-CM | POA: Diagnosis not present

## 2023-08-04 DIAGNOSIS — Z85828 Personal history of other malignant neoplasm of skin: Secondary | ICD-10-CM | POA: Diagnosis not present

## 2023-08-04 NOTE — Plan of Care (Signed)
  Problem: Pain Management: Goal: General experience of comfort will improve Outcome: Progressing   Problem: Safety: Goal: Ability to remain free from injury will improve Outcome: Progressing

## 2023-08-04 NOTE — Progress Notes (Addendum)
Pt not able to easily return to urology office to get JP removed due to not having transportation. Output out of JP monitored and output has decreased to a point where it is safe to remove today before discharge per Uro NP [place].   D/c instructions reviewed with patient. IV and JP removed, pt d/ed with all patient belongings.

## 2023-08-04 NOTE — TOC Transition Note (Signed)
Transition of Care [facility]) - CM/SW Discharge Note   Patient Details  Name: IBIN CIPRES MRN: [id number] Date of Birth: July 25, 1961  Transition of Care [facility]) CM/SW Contact:  Lanier Clam, RN Phone Number: 08/04/2023, 11:36 AM   Clinical Narrative: d/c home No CM needs.      Final next level of care: Home/Self Care Barriers to Discharge: No Barriers Identified   Patient Goals and CMS Choice CMS Medicare.gov Compare Post Acute Care list provided to:: Patient Choice offered to / list presented to : Patient  Discharge Placement                         Discharge Plan and Services Additional resources added to the After Visit Summary for     Discharge Planning Services: CM Consult                                 Social Determinants of Health (SDOH) Interventions SDOH Screenings   Food Insecurity: No Food Insecurity (08/03/2023)  Housing: Patient Unable To Answer (08/03/2023)  Transportation Needs: No Transportation Needs (08/03/2023)  Utilities: Not At Risk (08/03/2023)  Financial Resource Strain: Low Risk  (11/20/2018)  Physical Activity: Inactive (11/20/2018)  Social Connections: Unknown (11/20/2018)  Stress: No Stress Concern Present (11/20/2018)  Tobacco Use: Low Risk  (08/03/2023)     Readmission Risk Interventions     No data to display

## 2023-08-04 NOTE — Discharge Summary (Addendum)
Date of admission: 08/03/2023  Date of discharge: 08/04/2023  Admission diagnosis:  S/P insertion of penile implant [Z96.0]   Discharge diagnosis:  S/P insertion of penile implant [Z96.0]  Secondary diagnoses:   Active Ambulatory Problems    Diagnosis Date Noted   Hypertension    Adrenal insufficiency (HCC) 07/22/2012   Multiple myeloma in remission (HCC) 07/05/2013   Anxiety 07/05/2013   S/P bone marrow transplant (HCC) 09/29/2013   Depression 11/22/2013   Medically noncompliant 09/29/2015   Hematuria, gross 10/30/2016   History of skin cancer 10/30/2016   Dehydration 01/26/2017   Hypotension 01/26/2017   Thrombocytopenia (HCC) 01/26/2017   Leukopenia 01/26/2017   Acute renal insufficiency 01/26/2017   Acute bronchitis 01/26/2017   Syncope 01/26/2017   Orthostatic hypotension 01/26/2017   Epistaxis 01/26/2017   History of ETOH abuse 01/26/2017   Pancytopenia, acquired (HCC) 06/10/2017   Elevated serum creatinine 06/10/2018   Bilateral leg cramps 06/10/2018   TIA (transient ischemic attack) 11/20/2018   Paresthesia of left arm and leg 11/21/2018   Vitamin D deficiency 06/22/2019   B12 deficiency 06/22/2019   Blurred vision 06/21/2020   Resolved Ambulatory Problems    Diagnosis Date Noted   Headache 02/08/2012   Epistaxis 02/08/2012   Needs flu shot 07/05/2013   Nausea alone 09/12/2013   Neutropenic fever (HCC) 09/18/2013   Knee pain 10/18/2013   Malaise and fatigue 10/16/2013   Past Medical History:  Diagnosis Date   Acid reflux    AVN (avascular necrosis of bone) (HCC)    Bone metastases 03/31/13   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 Briefly, Austin Griffith is a 62 y.o. year old patient who was admitted with ED refractory to medicaiton.   Hospital Course: Pt admitted and underwent IPP placement on 08/03/2023. Their hospital course was unremarkable. By POD1, they were tolerating a regular diet, voiding spontaneously, pain was controlled with oral medications, and they were deemed appropriate for discharge.   Their course was complicated by: None  On the day of discharge, the patient was tolerating a regular diet and their pain was well controlled. They were determined to be stable for discharge home  EXAM: NAD, Aox3 Clear resp Abd soft, ND Mild swelling/ecchymosis   Pt will discharge home with course of abx.  Drain to be removed this Friday (pt will contact clinic if he next Monday works better)  Laboratory values:  Recent Labs    08/03/23 1024  HGB 12.8*  HCT 36.8*   Recent Labs    08/03/23 1024  CREATININE 1.42*    Disposition: Home  Discharge medications:  Allergies as of 08/04/2023   No Known Allergies      Medication List     TAKE these medications    acetaminophen 500 MG tablet Commonly known as: TYLENOL Take 2 tablets (1,000 mg total) by mouth every 6 (six) hours for 7 days.   celecoxib 200 MG capsule Commonly known as: CeleBREX Take 1 capsule (200 mg total) by mouth 2 (two) times daily for 14  days.   lansoprazole 15 MG capsule Commonly known as: PREVACID Take 15 mg by mouth daily.   LORazepam 1 MG tablet Commonly known as: Ativan Take 1 tablet (1 mg total) by mouth 2 (two) times daily as needed for anxiety. What changed: when to take this   losartan 50 MG tablet Commonly known as: COZAAR Take 50 mg by mouth daily as needed (high blood pressure).   oxyCODONE 5 MG immediate release tablet Commonly known as: Roxicodone Take 1 tablet (5 mg  total) by mouth every 6 (six) hours as needed.   sulfamethoxazole-trimethoprim 800-160 MG tablet Commonly known as: BACTRIM DS Take 1 tablet by mouth 2 (two) times daily for 14 days.        Followup: Friday for drain removal (08/06/2023)

## 2023-08-06 ENCOUNTER — Ambulatory Visit: Payer: BC Managed Care – PPO | Admitting: Hematology and Oncology

## 2023-08-31 IMAGING — CT CT WRIST*L* W/O CM
4 of 6 series · 16 of 36 positions shown, 17 images · non-contrast
Comparison: Radiographs dated January 12, 2022

CLINICAL DATA: Wrist pain, infection suspected. Patient with hand
pain and swelling, was seen here on [REDACTED] and treated for gout.

EXAM:
CT OF THE LEFT WRIST WITHOUT CONTRAST
TECHNIQUE: Multidetector CT imaging was performed according to the standard
protocol. Multiplanar CT image reconstructions were also generated.
RADIATION DOSE REDUCTION: This exam was performed according to the
departmental dose-optimization program which includes automated
exposure control, adjustment of the mA and/or kV according to
patient size and/or use of iterative reconstruction technique.

[Series 4: thin soft · axial · 0.31mm/px · z∈[+889,+950]mm · 6 of 339 slices shown]
[im 34/339  soft-tissue]
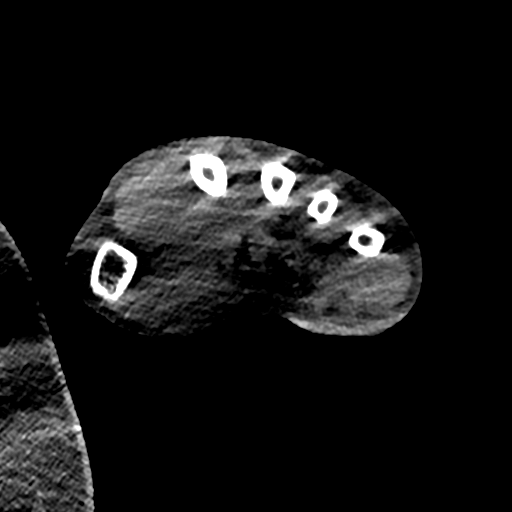
[im 68/339  soft-tissue]
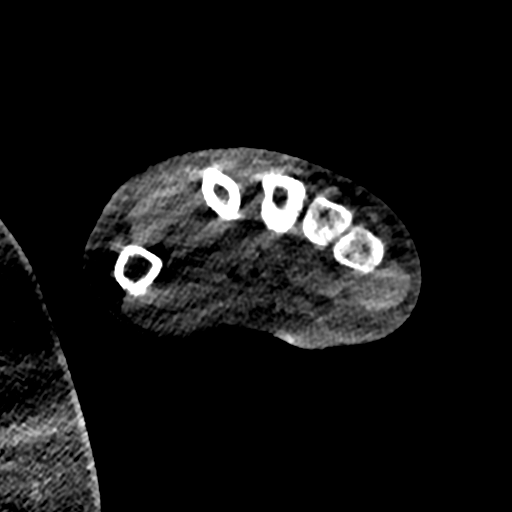
[im 102/339  soft-tissue]
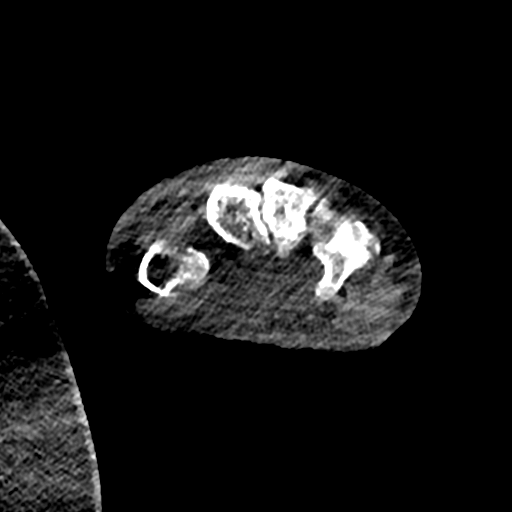
[im 136/339  soft-tissue]
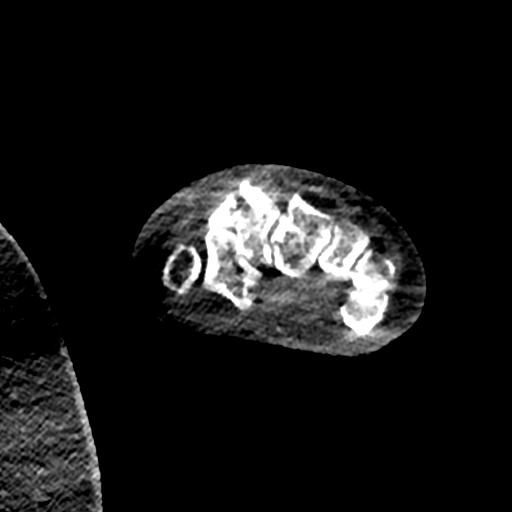
[im 203/339  soft-tissue]
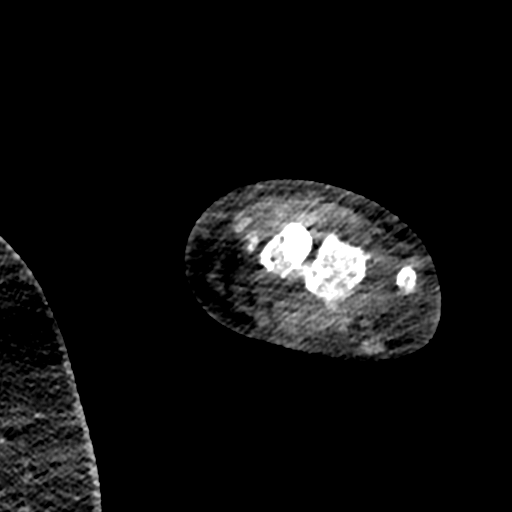
[im 237/339  soft-tissue]
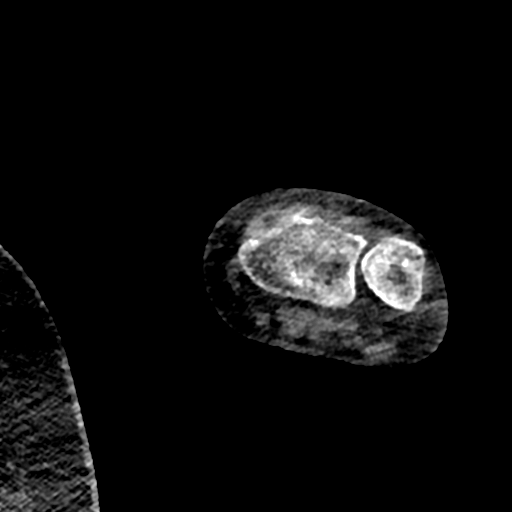

[Series 5: cor bone · coronal · 0.29mm/px · 1 of 87 slices shown]
[im 44/87  bone]
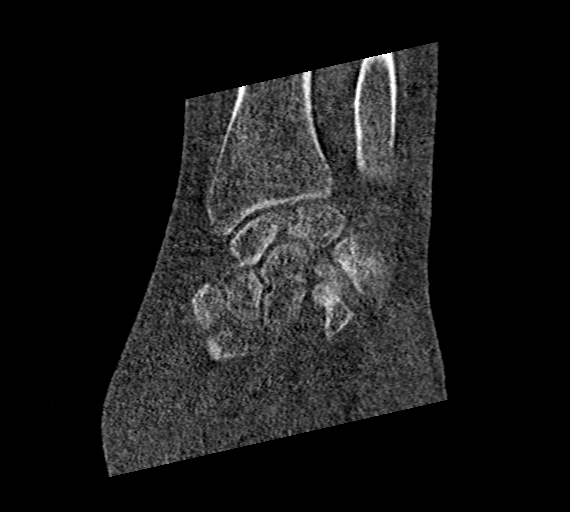

[Series 6: sag bone · sagittal · 0.27mm/px · 6 of 125 slices shown]
[im 18/125  bone]
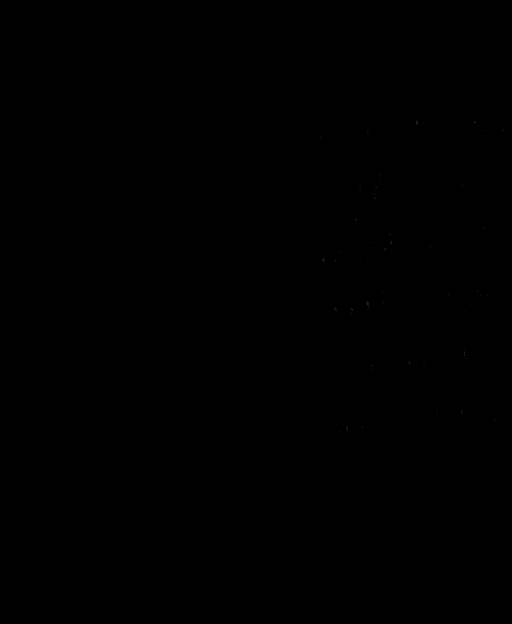
[im 35/125  soft-tissue]
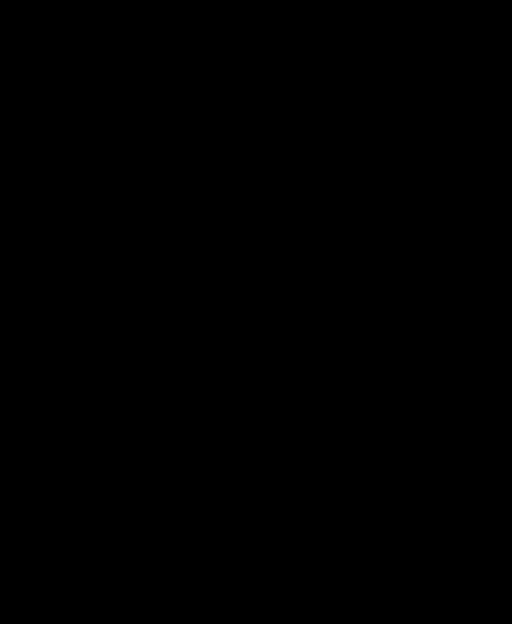
[im 36/125  bone]
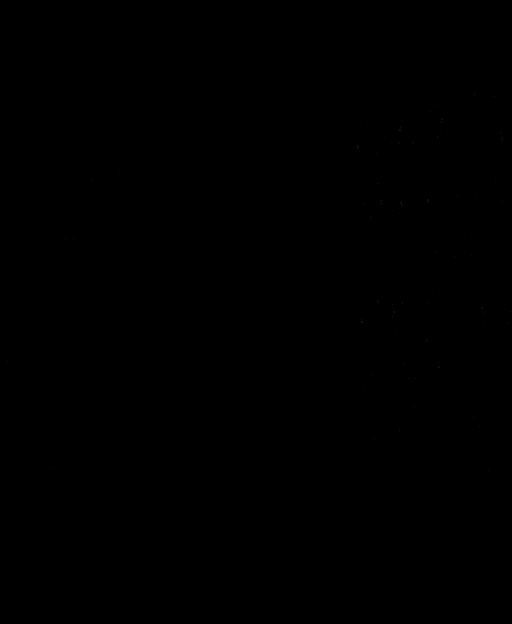
[im 54/125  bone]
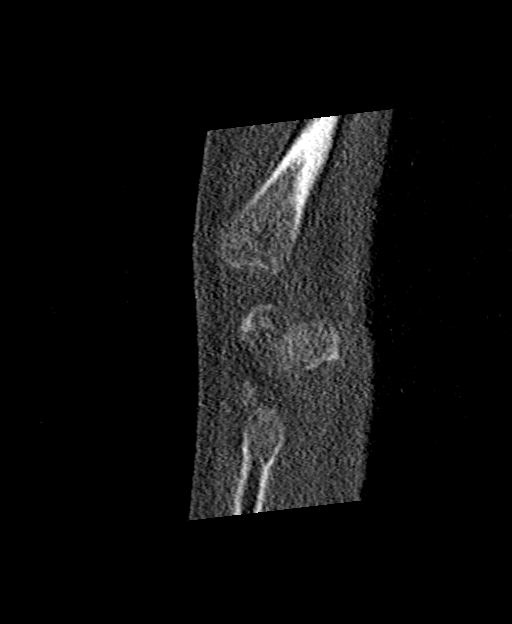
[im 71/125  bone]
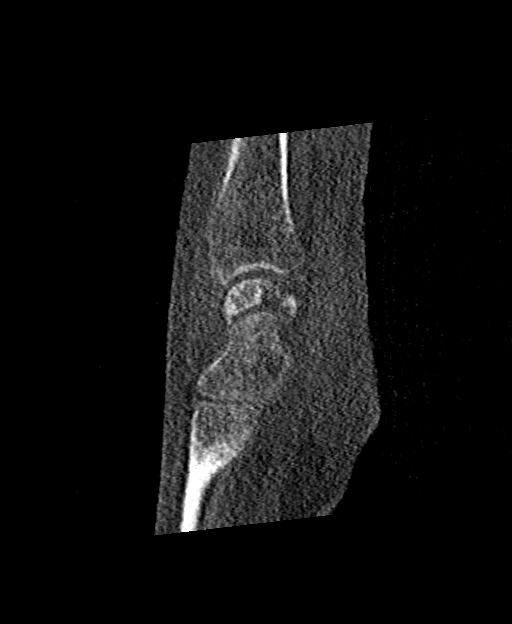
[im 89/125  bone]
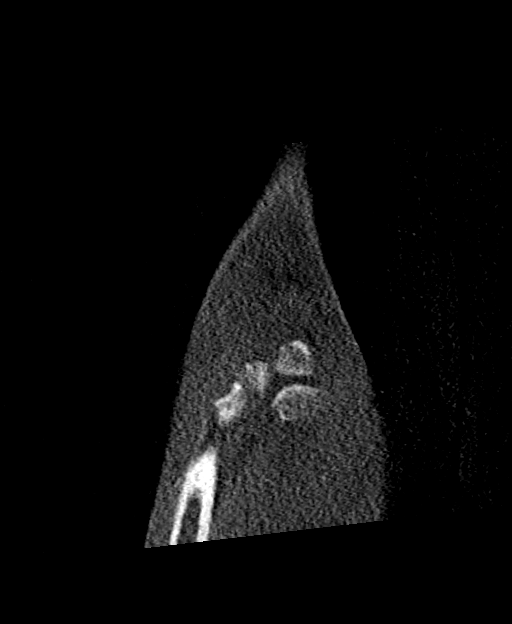

[Series 7: axial st · axial · 0.28mm/px · z∈[+906,+1021]mm · 3 of 80 slices shown, 4 images]
[im 1/80  soft-tissue]
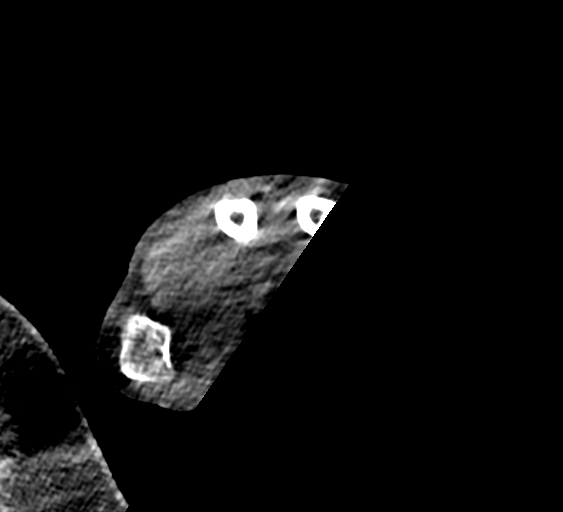
[im 1/80  bone]
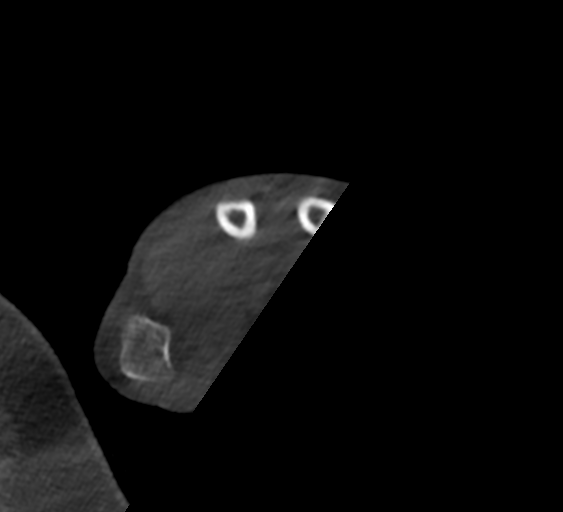
[im 40/80  bone]
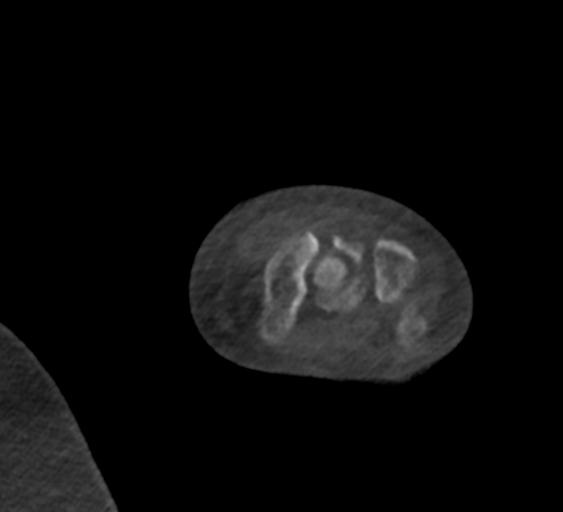
[im 80/80  bone]
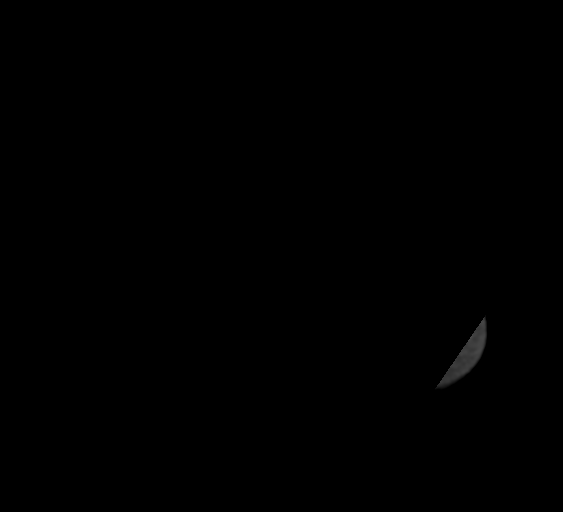

[16 of 36 positions shown; findings below may reference images not displayed]

FINDINGS: Bones/Joint/Cartilage

No fracture or dislocation. Normal alignment. No joint effusion.

Ligaments

Ligaments are suboptimally evaluated by CT.

Muscles and Tendons
Thenar and hypothenar muscles and tendons of the flexor and extensor
compartment are within normal limits. No appreciable fluid
collection or hematoma.

Soft tissue
Generalized subcutaneous soft tissue edema. No fluid collection or
abscess.
IMPRESSION: 1.  No evidence of fracture or dislocation.

2. No appreciable fluid collection or abscess on this unenhanced
examination.

## 2023-09-02 DIAGNOSIS — N529 Male erectile dysfunction, unspecified: Secondary | ICD-10-CM | POA: Diagnosis not present

## 2023-11-25 ENCOUNTER — Other Ambulatory Visit: Payer: Self-pay | Admitting: Hematology and Oncology

## 2023-11-25 ENCOUNTER — Telehealth: Payer: Self-pay

## 2023-11-25 DIAGNOSIS — C9001 Multiple myeloma in remission: Secondary | ICD-10-CM

## 2023-11-25 NOTE — Telephone Encounter (Signed)
Called and moved Dr. Bertis Ruddy appt to 2/17 at 1240. He is aware of appt.

## 2023-11-26 ENCOUNTER — Inpatient Hospital Stay: Payer: BC Managed Care – PPO | Attending: Family Medicine

## 2023-11-26 ENCOUNTER — Ambulatory Visit: Payer: BC Managed Care – PPO | Admitting: Hematology and Oncology

## 2023-11-26 DIAGNOSIS — Z9481 Bone marrow transplant status: Secondary | ICD-10-CM | POA: Insufficient documentation

## 2023-11-26 DIAGNOSIS — E538 Deficiency of other specified B group vitamins: Secondary | ICD-10-CM | POA: Insufficient documentation

## 2023-11-26 DIAGNOSIS — Z79899 Other long term (current) drug therapy: Secondary | ICD-10-CM | POA: Insufficient documentation

## 2023-11-26 DIAGNOSIS — R7989 Other specified abnormal findings of blood chemistry: Secondary | ICD-10-CM | POA: Insufficient documentation

## 2023-11-26 DIAGNOSIS — C9001 Multiple myeloma in remission: Secondary | ICD-10-CM | POA: Insufficient documentation

## 2023-11-30 ENCOUNTER — Other Ambulatory Visit: Payer: Self-pay | Admitting: Hematology and Oncology

## 2023-11-30 ENCOUNTER — Inpatient Hospital Stay: Payer: BC Managed Care – PPO

## 2023-11-30 ENCOUNTER — Other Ambulatory Visit: Payer: Self-pay

## 2023-11-30 DIAGNOSIS — C9001 Multiple myeloma in remission: Secondary | ICD-10-CM | POA: Diagnosis present

## 2023-11-30 DIAGNOSIS — E538 Deficiency of other specified B group vitamins: Secondary | ICD-10-CM

## 2023-11-30 DIAGNOSIS — Z9481 Bone marrow transplant status: Secondary | ICD-10-CM | POA: Diagnosis not present

## 2023-11-30 DIAGNOSIS — Z79899 Other long term (current) drug therapy: Secondary | ICD-10-CM | POA: Diagnosis not present

## 2023-11-30 DIAGNOSIS — R7989 Other specified abnormal findings of blood chemistry: Secondary | ICD-10-CM | POA: Diagnosis not present

## 2023-11-30 LAB — CBC WITH DIFFERENTIAL (CANCER CENTER ONLY)
Abs Immature Granulocytes: 0.01 10*3/uL (ref 0.00–0.07)
Basophils Absolute: 0 10*3/uL (ref 0.0–0.1)
Basophils Relative: 1 %
Eosinophils Absolute: 0.1 10*3/uL (ref 0.0–0.5)
Eosinophils Relative: 3 %
HCT: 36.7 % — ABNORMAL LOW (ref 39.0–52.0)
Hemoglobin: 13.2 g/dL (ref 13.0–17.0)
Immature Granulocytes: 0 %
Lymphocytes Relative: 30 %
Lymphs Abs: 1.2 10*3/uL (ref 0.7–4.0)
MCH: 32.4 pg (ref 26.0–34.0)
MCHC: 36 g/dL (ref 30.0–36.0)
MCV: 90.2 fL (ref 80.0–100.0)
Monocytes Absolute: 0.2 10*3/uL (ref 0.1–1.0)
Monocytes Relative: 6 %
Neutro Abs: 2.3 10*3/uL (ref 1.7–7.7)
Neutrophils Relative %: 60 %
Platelet Count: 122 10*3/uL — ABNORMAL LOW (ref 150–400)
RBC: 4.07 MIL/uL — ABNORMAL LOW (ref 4.22–5.81)
RDW: 12.6 % (ref 11.5–15.5)
WBC Count: 3.9 10*3/uL — ABNORMAL LOW (ref 4.0–10.5)
nRBC: 0 % (ref 0.0–0.2)

## 2023-11-30 LAB — VITAMIN B12: Vitamin B-12: 210 pg/mL (ref 180–914)

## 2023-11-30 LAB — CMP (CANCER CENTER ONLY)
ALT: 18 U/L (ref 0–44)
AST: 22 U/L (ref 15–41)
Albumin: 4.2 g/dL (ref 3.5–5.0)
Alkaline Phosphatase: 54 U/L (ref 38–126)
Anion gap: 7 (ref 5–15)
BUN: 15 mg/dL (ref 6–20)
CO2: 25 mmol/L (ref 22–32)
Calcium: 9.5 mg/dL (ref 8.9–10.3)
Chloride: 106 mmol/L (ref 98–111)
Creatinine: 1.46 mg/dL — ABNORMAL HIGH (ref 0.61–1.24)
GFR, Estimated: 55 mL/min — ABNORMAL LOW (ref >60)
Glucose, Bld: 100 mg/dL — ABNORMAL HIGH (ref 70–99)
Potassium: 3.8 mmol/L (ref 3.5–5.1)
Sodium: 138 mmol/L (ref 135–145)
Total Bilirubin: 0.7 mg/dL (ref 0.0–1.2)
Total Protein: 6.9 g/dL (ref 6.5–8.1)

## 2023-12-01 LAB — KAPPA/LAMBDA LIGHT CHAINS
Kappa free light chain: 32 mg/L — ABNORMAL HIGH (ref 3.3–19.4)
Kappa, lambda light chain ratio: 1.4 (ref 0.26–1.65)
Lambda free light chains: 22.9 mg/L (ref 5.7–26.3)

## 2023-12-07 DIAGNOSIS — M25561 Pain in right knee: Secondary | ICD-10-CM | POA: Diagnosis not present

## 2023-12-07 LAB — MULTIPLE MYELOMA PANEL, SERUM
Albumin SerPl Elph-Mcnc: 3.7 g/dL (ref 2.9–4.4)
Albumin/Glob SerPl: 1.3 (ref 0.7–1.7)
Alpha 1: 0.2 g/dL (ref 0.0–0.4)
Alpha2 Glob SerPl Elph-Mcnc: 0.7 g/dL (ref 0.4–1.0)
B-Globulin SerPl Elph-Mcnc: 1.2 g/dL (ref 0.7–1.3)
Gamma Glob SerPl Elph-Mcnc: 0.8 g/dL (ref 0.4–1.8)
Globulin, Total: 3 g/dL (ref 2.2–3.9)
IgA: 511 mg/dL — ABNORMAL HIGH (ref 90–386)
IgG (Immunoglobin G), Serum: 845 mg/dL (ref 603–1613)
IgM (Immunoglobulin M), Srm: 140 mg/dL (ref 20–172)
Total Protein ELP: 6.7 g/dL (ref 6.0–8.5)

## 2023-12-09 ENCOUNTER — Telehealth: Payer: Self-pay

## 2023-12-09 ENCOUNTER — Encounter: Payer: Self-pay | Admitting: Hematology and Oncology

## 2023-12-09 ENCOUNTER — Ambulatory Visit: Payer: BC Managed Care – PPO | Admitting: Hematology and Oncology

## 2023-12-09 ENCOUNTER — Inpatient Hospital Stay (HOSPITAL_BASED_OUTPATIENT_CLINIC_OR_DEPARTMENT_OTHER): Payer: BC Managed Care – PPO | Admitting: Hematology and Oncology

## 2023-12-09 DIAGNOSIS — R7989 Other specified abnormal findings of blood chemistry: Secondary | ICD-10-CM

## 2023-12-09 DIAGNOSIS — E538 Deficiency of other specified B group vitamins: Secondary | ICD-10-CM | POA: Diagnosis not present

## 2023-12-09 DIAGNOSIS — C9001 Multiple myeloma in remission: Secondary | ICD-10-CM

## 2023-12-09 NOTE — Assessment & Plan Note (Signed)
 He has recent memory issues His B12 level is borderline low I recommend oral vitamin B12 supplement daily

## 2023-12-09 NOTE — Assessment & Plan Note (Signed)
 I reviewed myeloma panel with the patient Currently, he has no signs of disease recurrence His pancytopenia is unlikely to be related From the myeloma standpoint, I plan to see him once a year  The patient is educated to watch out for signs and symptoms of cancer recurrence

## 2023-12-09 NOTE — Progress Notes (Signed)
 HEMATOLOGY-ONCOLOGY ELECTRONIC VISIT PROGRESS NOTE  Patient Care Team: Assunta Found, MD as PCP - General (Family Medicine) Eddie Candle, MD as PCP - Hematology/Oncology (Hematology and Oncology) Suzanna Obey, MD as Attending Physician (Otolaryngology) Lonie Peak, MD (Radiation Oncology) Reather Littler, MD (Inactive) as Attending Physician (Endocrinology) Jeani Hawking, MD as Attending Physician (Gastroenterology) Artis Delay, MD as Consulting Physician (Hematology and Oncology)  I connected with the patient via telephone conference and verified that I am speaking with the correct person using two identifiers. The patient's location is at home and I am providing care from the [facility] I discussed the limitations, risks, security and privacy concerns of performing an evaluation and management service by e-visits and the availability of in person appointments.  I also discussed with the patient that there may be a patient responsible charge related to this service. The patient expressed understanding and agreed to proceed.   ASSESSMENT & PLAN:  Multiple myeloma in remission (HCC) I reviewed myeloma panel with the patient Currently, he has no signs of disease recurrence His pancytopenia is unlikely to be related From the myeloma standpoint, I plan to see him once a year  The patient is educated to watch out for signs and symptoms of cancer recurrence  B12 deficiency He has recent memory issues His B12 level is borderline low I recommend oral vitamin B12 supplement daily  Elevated serum creatinine He has borderline elevated serum creatinine The patient admits he does not drink water We discussed the importance of adequate hydration  Orders Placed This Encounter  Procedures   CMP (Cancer Center only)    Standing Status:   Future    Expiration Date:   12/08/2024   CBC with Differential (Cancer Center Only)    Standing Status:   Future    Expiration Date:   12/08/2024    Vitamin B12    Standing Status:   Future    Expiration Date:   12/08/2024   VITAMIN D 25 Hydroxy (Vit-D Deficiency, Fractures)    Standing Status:   Future    Expiration Date:   12/08/2024   Kappa/lambda light chains    Standing Status:   Future    Expiration Date:   12/08/2024   Multiple Myeloma Panel (SPEP&IFE w/QIG)    Standing Status:   Future    Expiration Date:   12/08/2024    INTERVAL HISTORY: Please see below for problem oriented charting. The purpose of today's discussion is review myeloma test results His appointment was switched to phone visit as the patient is suffering from pain due to recent acute gout He has not been taking his vitamin B12 supplement We discussed and review myeloma test results and future follow-up  SUMMARY OF ONCOLOGIC HISTORY: Oncology History Overview Note  IgG Kappa light chain disease, Durie-Salmon stage III   Multiple myeloma in remission (HCC)  01/19/2012 Imaging   The patient has CT scan of the head for evaluation of severe headaches which show abnormalities in the sinus   01/22/2012 Bone Marrow Biopsy   Bone marrow biopsy show only 2% plasma cell   01/22/2012 Imaging   CT scan of the sinus showed Ethmoid mucocele with some expansion of the air cells and soft tissue extending into the upper nasal cavity.    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  No additional areas of abnormalities elsewhere.     05/20/2012 Imaging   Repeat CT scan of the sinuses show complete response to treatment   03/03/2013 Imaging   CT scan of the chest showed fracture of the left seventh rib represents a pathologic fracture.  There is a destructive soft tissue mass at the site of the fracture worrisome for malignancy.    03/10/2013 Bone Marrow Biopsy   Repeat bone marrow biopsy again initial 2% plasma  cell   03/17/2013 Imaging   Repeat PET scan showed numerous lytic myelomatous bone lesions involving the spine, left ribs and left iliac bone. There were also two pancreatic lesions and possibly three liver lesions suspicious for metastatic disease.     03/21/2013 Procedure   Biopsy of the rib lesion came back plasmacytoma   03/24/2013 Procedure   Fine-needle aspirate and biopsy of the pancreatic mass confirmed plasma cell    06/09/2013 - 08/09/2013 Chemotherapy   The patient completed 4 months of induction chemotherapy with Revlimid, Velcade and dexamethasone along with Zometa   08/21/2013 Imaging   Repeat PET scan at [facility] show persistent lytic lesions but there were no abnormalities in the pancreas or liver   09/28/2013 Bone Marrow Transplant   The patient underwent autologous stem cell rescue after conditioning therapy with melphalan   02/26/2015 Miscellaneous   Established oncology care at  [facility]   02/26/2015 Miscellaneous   Non-complicance with appointments: No show on: 03/29/2015, 04/03/2015, 04/25/2015.     REVIEW OF SYSTEMS:   Constitutional: Denies fevers, chills or abnormal weight loss Eyes: Denies blurriness of vision Ears, nose, mouth, throat, and face: Denies mucositis or sore throat Respiratory: Denies cough, dyspnea or wheezes Cardiovascular: Denies palpitation, chest discomfort Gastrointestinal:  Denies nausea, heartburn or change in bowel habits Skin: Denies abnormal skin rashes Lymphatics: Denies new lymphadenopathy or easy bruising Neurological:Denies numbness, tingling or new weaknesses Behavioral/Psych: Mood is stable, no new changes  Extremities: No lower extremity edema All other systems were reviewed with the patient and are negative.  I have reviewed the past medical history, past surgical history, social history and family history with the patient and they are unchanged from previous note.  ALLERGIES:  has no known  allergies.  MEDICATIONS:  Current Outpatient Medications  Medication Sig Dispense Refill   cyanocobalamin (VITAMIN B12) 1000 MCG tablet Take 1,000 mcg by mouth daily.     predniSONE (STERAPRED UNI-PAK 21 TAB) 10 MG (21) TBPK tablet SMARTSIG:- Tablet(s) By Mouth -     lansoprazole (PREVACID) 15 MG capsule Take 15 mg by mouth daily.     LORazepam (ATIVAN) 1 MG tablet Take 1 tablet (1 mg total) by mouth 2 (two) times daily as needed for anxiety. (Patient taking differently: Take 1 mg by mouth daily.) 20 tablet 0   losartan (COZAAR) 50 MG tablet Take 50 mg by mouth daily as needed (high blood pressure).     No current facility-administered medications for this visit.    PHYSICAL EXAMINATION: ECOG PERFORMANCE STATUS: 0 - Asymptomatic  LABORATORY DATA:  I have reviewed the data as listed    Latest Ref Rng & Units 11/30/2023   11:41 AM 08/03/2023   10:24 AM 06/16/2022   11:02 AM  CMP  Glucose 70 - 99 mg/dL 657  99  99   BUN 6 - 20 mg/dL 15  18  12    Creatinine 0.61 - 1.24 mg/dL 8.46  9.62  9.52   Sodium 135 - 145 mmol/L  138  136  136   Potassium 3.5 - 5.1 mmol/L 3.8  4.3  3.6   Chloride 98 - 111 mmol/L 106  105  103   CO2 22 - 32 mmol/L 25  23  28    Calcium 8.9 - 10.3 mg/dL 9.5  9.1  9.2   Total Protein 6.5 - 8.1 g/dL 6.9   6.7   Total Bilirubin 0.0 - 1.2 mg/dL 0.7   0.7   Alkaline Phos 38 - 126 U/L 54   59   AST 15 - 41 U/L 22   22   ALT 0 - 44 U/L 18   18     Lab Results  Component Value Date   WBC 3.9 (L) 11/30/2023   HGB 13.2 11/30/2023   HCT 36.7 (L) 11/30/2023   MCV 90.2 11/30/2023   PLT 122 (L) 11/30/2023   NEUTROABS 2.3 11/30/2023    I spent 20 minutes for the appointment reviewing test results, discuss management and coordination of care.  Artis Delay, MD 12/09/2023 1:42 PM

## 2023-12-09 NOTE — Telephone Encounter (Signed)
 Called regarding not showing up for appt today. He said that he canceled appt and left a message with scheduling. Changed appt to phone visit today. He is aware of appt today.

## 2023-12-09 NOTE — Assessment & Plan Note (Signed)
 He has borderline elevated serum creatinine The patient admits he does not drink water We discussed the importance of adequate hydration

## 2023-12-10 ENCOUNTER — Telehealth: Payer: Self-pay | Admitting: Hematology and Oncology

## 2023-12-10 NOTE — Telephone Encounter (Signed)
 Spoke with patient confirming upcoming appointment

## 2023-12-12 DIAGNOSIS — Z6829 Body mass index (BMI) 29.0-29.9, adult: Secondary | ICD-10-CM | POA: Diagnosis not present

## 2023-12-12 DIAGNOSIS — I10 Essential (primary) hypertension: Secondary | ICD-10-CM | POA: Diagnosis not present

## 2023-12-12 DIAGNOSIS — M109 Gout, unspecified: Secondary | ICD-10-CM | POA: Diagnosis not present

## 2023-12-12 DIAGNOSIS — E663 Overweight: Secondary | ICD-10-CM | POA: Diagnosis not present

## 2024-01-10 DIAGNOSIS — Z6829 Body mass index (BMI) 29.0-29.9, adult: Secondary | ICD-10-CM | POA: Diagnosis not present

## 2024-01-10 DIAGNOSIS — H6123 Impacted cerumen, bilateral: Secondary | ICD-10-CM | POA: Diagnosis not present

## 2024-01-10 DIAGNOSIS — R030 Elevated blood-pressure reading, without diagnosis of hypertension: Secondary | ICD-10-CM | POA: Diagnosis not present

## 2024-01-10 DIAGNOSIS — H9193 Unspecified hearing loss, bilateral: Secondary | ICD-10-CM | POA: Diagnosis not present

## 2024-01-12 DIAGNOSIS — E669 Obesity, unspecified: Secondary | ICD-10-CM | POA: Diagnosis not present

## 2024-01-12 DIAGNOSIS — Z6830 Body mass index (BMI) 30.0-30.9, adult: Secondary | ICD-10-CM | POA: Diagnosis not present

## 2024-01-12 DIAGNOSIS — I10 Essential (primary) hypertension: Secondary | ICD-10-CM | POA: Diagnosis not present

## 2024-01-12 DIAGNOSIS — H6121 Impacted cerumen, right ear: Secondary | ICD-10-CM | POA: Diagnosis not present

## 2024-01-14 ENCOUNTER — Ambulatory Visit (INDEPENDENT_AMBULATORY_CARE_PROVIDER_SITE_OTHER): Admitting: Physician Assistant

## 2024-01-14 ENCOUNTER — Ambulatory Visit (INDEPENDENT_AMBULATORY_CARE_PROVIDER_SITE_OTHER): Admitting: Audiology

## 2024-01-14 ENCOUNTER — Institutional Professional Consult (permissible substitution) (INDEPENDENT_AMBULATORY_CARE_PROVIDER_SITE_OTHER)

## 2024-01-14 DIAGNOSIS — H6123 Impacted cerumen, bilateral: Secondary | ICD-10-CM | POA: Diagnosis not present

## 2024-01-14 NOTE — Progress Notes (Signed)
 Dear Dr. Phillips Odor, Here is my assessment for our mutual patient, Austin Griffith. Thank you for allowing me the opportunity to care for your patient. Please do not hesitate to contact me should you have any other questions. Sincerely, Burna Forts PA-C  Otolaryngology Clinic Note Referring provider: Dr. Phillips Odor HPI:  Austin Griffith is a 63 y.o. male kindly referred by Dr. Phillips Odor   The patient is a 63 year old male seen in the office for hearing loss.  The patient notes he has baseline normal hearing, he put earplugs in over the weekend and immediately had decreased hearing bilateral.  He notes a muffled sound.  He denies any associated pain, no infectious signs or symptoms.  He denies any head or neck trauma.  He notes significant noise exposure but denies any baseline hearing loss. He has tried using debrox with no improvement.   He does have a history of multiple myeloma status postchemotherapy radiatio. He also had acute loss of vision in his left eye and now has a prosthetic. He also notes losing his sense of smelling during cancer treatment.     Independent Review of Additional Tests or Records:  none   PMH/Meds/All/SocHx/FamHx/ROS:   Past Medical History:  Diagnosis Date   Acid reflux    Anxiety 07/05/2013   AVN (avascular necrosis of bone) (HCC)    bilateral hips; s/p hip replacement in 1991 and 2009   Blurred vision    Present for "many years"   Bone metastases 03/31/13    MR Abdomen -Lower Thoracic and Upper Lumbar spine   Depression 11/22/2013   Epistaxis 02/08/2012   Extramedullary plasmacytoma in relapse (HCC) 04/10/2013   Fatigue    Fatigue 05/25/12   "Mild"   Headache(784.0) 02/08/2012   Right Sided Headache Behind Right Eye   History of radiation therapy 02/29/12-04/08/12   right  ethmoid sinus in 50.4Gy in 20 fxs   Hyperlipidemia    diet control    Hypertension    Knee pain 10/18/2013   Liver lesion 03/31/13   MR Abdomen   Medically noncompliant 09/29/2015    Multiple myeloma in remission (HCC) 11/22/2013   Multiple myeloma, in relapse 07/05/2013   Nausea alone 09/12/2013   Neutropenic fever (HCC) 09/18/2013   On antineoplastic chemotherapy started 04/08/13   Revlimid/ Velcade/ Dex   Pancreatic lesion 03/31/13    MR Abdomen - Ucinate Process   Plasmacytoma (HCC)     Right Ethmoid Sinus; bone marrow biopsy on 03/10/13 showed normal myeloma FISH panel and cytogenetics.    S/P radiation therapy 03/28/2013   Left posterior 7th Rib / 8Gy in 1 fraction   Shingles Feb 24, 2015   Thrombocytopenia (HCC) 05/25/12     Past Surgical History:  Procedure Laterality Date   Bilateral hip replacement  1991; and 2009   due to bilateral hip AVN's. / reports left hip was not replaced   EUS N/A 03/24/2013   Procedure: UPPER ENDOSCOPIC ULTRASOUND (EUS) LINEAR;  Surgeon: Theda Belfast, MD;  Location: [facility];  Service: Endoscopy;  Laterality: N/A;   FINE NEEDLE ASPIRATION N/A 03/24/2013   Procedure: FINE NEEDLE ASPIRATION (FNA) LINEAR;  Surgeon: Theda Belfast, MD;  Location: [facility];  Service: Endoscopy;  Laterality: N/A;   MASS BIOPSY  01/26/12   Right Ethmoid Mass - Plasma Cell Neoplasm   PENILE PROSTHESIS IMPLANT N/A 08/03/2023   Procedure: INSERTION OF INFLATABLE PENILE PROTHESIS;  Surgeon: Despina Arias, MD;  Location: [facility];  Service: Urology;  Laterality: N/A;  105 MINUTES NEEDED FOR CASE    Family History  Adopted: Yes     Social Connections: Unknown (11/20/2018)   Social Connection and Isolation Panel [NHANES]    Frequency of Communication with Friends and Family: More than three times a week    Frequency of Social Gatherings with Friends and Family: More than three times a week    Attends Religious Services: Patient declined    Database administrator or Organizations: Patient declined    Attends Banker Meetings: Patient declined    Marital Status: Patient declined      Current Outpatient Medications:    cyanocobalamin  (VITAMIN B12) 1000 MCG tablet, Take 1,000 mcg by mouth daily., Disp: , Rfl:    lansoprazole (PREVACID) 15 MG capsule, Take 15 mg by mouth daily., Disp: , Rfl:    LORazepam (ATIVAN) 1 MG tablet, Take 1 tablet (1 mg total) by mouth 2 (two) times daily as needed for anxiety. (Patient taking differently: Take 1 mg by mouth daily.), Disp: 20 tablet, Rfl: 0   losartan (COZAAR) 50 MG tablet, Take 50 mg by mouth daily as needed (high blood pressure)., Disp: , Rfl:    predniSONE (STERAPRED UNI-PAK 21 TAB) 10 MG (21) TBPK tablet, SMARTSIG:- Tablet(s) By Mouth -, Disp: , Rfl:    Physical Exam:   There were no vitals taken for this visit.  Pertinent Findings  CN II-XII intact  Bilateral EAC with cerumen impaction  Weber 512: equal Rinne 512: AC > BC b/l  Anterior rhinoscopy: Septum with left deviation; bilateral inferior turbinates with no hypertrophy No lesions of oral cavity/oropharynx; dentition wnl No obviously palpable neck masses/lymphadenopathy/thyromegaly No respiratory distress or stridor   Seprately Identifiable Procedures:  Procedure: Bilateral ear microscopy and cerumen removal using microscope (CPT [phone]) - Mod 50 Pre-procedure diagnosis: bilateral cerumen impaction external auditory canals Post-procedure diagnosis: same Indication: bilateral cerumen impaction; given patient's otologic complaints and history as well as for improved and comprehensive examination of external ear and tympanic membrane, bilateral otologic examination using microscope was performed and impacted cerumen removed  Procedure: Patient was placed semi-recumbent. Both ear canals were examined using the microscope with findings above. Cerumen removed from bilateral external auditory canals using suction and currette with improvement in EAC examination and patency. Left: EAC was patent. TM was intact . Middle ear was aerated. Drainage: none Right: EAC was patent. TM was intact . Middle ear was aerated . Drainage:  none Patient tolerated the procedure well.   Impression & Plans:  Austin Griffith is a 63 y.o. male with the following   Cerumen impaction-  The patient presented today with cerumen impaction.  This was removed without difficulty.  I see no signs of infection.  The patient will reach out to the office if she develops any new or worsening signs or symptoms.  She does not feel she has any significant change to her baseline hearing and did not elect for audiology evaluation today.    - f/u PRN   Thank you for allowing me the opportunity to care for your patient. Please do not hesitate to contact me should you have any other questions.  Sincerely, Burna Forts PA-C [facility] Phone: [phone] Fax: [phone]  01/14/2024, 10:16 AM

## 2024-01-19 ENCOUNTER — Encounter (INDEPENDENT_AMBULATORY_CARE_PROVIDER_SITE_OTHER): Payer: Self-pay | Admitting: Physician Assistant

## 2024-02-21 DIAGNOSIS — F419 Anxiety disorder, unspecified: Secondary | ICD-10-CM | POA: Diagnosis not present

## 2024-02-21 DIAGNOSIS — I10 Essential (primary) hypertension: Secondary | ICD-10-CM | POA: Diagnosis not present

## 2024-02-21 DIAGNOSIS — C9001 Multiple myeloma in remission: Secondary | ICD-10-CM | POA: Diagnosis not present

## 2024-02-21 DIAGNOSIS — E291 Testicular hypofunction: Secondary | ICD-10-CM | POA: Diagnosis not present

## 2024-06-02 ENCOUNTER — Encounter: Payer: Self-pay | Admitting: Radiology

## 2024-08-14 ENCOUNTER — Encounter: Payer: Self-pay | Admitting: Radiology

## 2024-11-28 ENCOUNTER — Inpatient Hospital Stay: Payer: BC Managed Care – PPO

## 2024-12-08 ENCOUNTER — Inpatient Hospital Stay: Payer: BC Managed Care – PPO | Admitting: Hematology and Oncology
